# Patient Record
Sex: Female | Born: 1950
Health system: Southern US, Community
[De-identification: ages and names within clinical notes are randomized; demographics above are authoritative.]

## PROBLEM LIST (undated history)

## (undated) DIAGNOSIS — E119 Type 2 diabetes mellitus without complications: Secondary | ICD-10-CM

## (undated) DIAGNOSIS — S52123A Displaced fracture of head of unspecified radius, initial encounter for closed fracture: Secondary | ICD-10-CM

## (undated) DIAGNOSIS — Z9889 Other specified postprocedural states: Secondary | ICD-10-CM

## (undated) DIAGNOSIS — Z9221 Personal history of antineoplastic chemotherapy: Secondary | ICD-10-CM

## (undated) DIAGNOSIS — M1990 Unspecified osteoarthritis, unspecified site: Secondary | ICD-10-CM

## (undated) DIAGNOSIS — E785 Hyperlipidemia, unspecified: Secondary | ICD-10-CM

## (undated) DIAGNOSIS — I10 Essential (primary) hypertension: Secondary | ICD-10-CM

## (undated) DIAGNOSIS — S42409A Unspecified fracture of lower end of unspecified humerus, initial encounter for closed fracture: Secondary | ICD-10-CM

## (undated) DIAGNOSIS — C801 Malignant (primary) neoplasm, unspecified: Secondary | ICD-10-CM

## (undated) DIAGNOSIS — T7840XA Allergy, unspecified, initial encounter: Secondary | ICD-10-CM

## (undated) DIAGNOSIS — B019 Varicella without complication: Secondary | ICD-10-CM

## (undated) DIAGNOSIS — Z923 Personal history of irradiation: Secondary | ICD-10-CM

## (undated) HISTORY — PX: COLONOSCOPY: SHX174

## (undated) HISTORY — DX: Hyperlipidemia, unspecified: E78.5

## (undated) HISTORY — DX: Allergy, unspecified, initial encounter: T78.40XA

## (undated) HISTORY — DX: Varicella without complication: B01.9

## (undated) HISTORY — DX: Displaced fracture of head of unspecified radius, initial encounter for closed fracture: S52.123A

## (undated) HISTORY — DX: Unspecified fracture of lower end of unspecified humerus, initial encounter for closed fracture: S42.409A

## (undated) HISTORY — DX: Malignant (primary) neoplasm, unspecified: C80.1

## (undated) HISTORY — DX: Type 2 diabetes mellitus without complications: E11.9

## (undated) HISTORY — DX: Essential (primary) hypertension: I10

## (undated) HISTORY — PX: FRACTURE SURGERY: SHX138

## (undated) HISTORY — DX: Other specified postprocedural states: Z98.890

## (undated) HISTORY — DX: Unspecified osteoarthritis, unspecified site: M19.90

---

## 1988-07-31 HISTORY — PX: DILATION AND CURETTAGE OF UTERUS: SHX78

## 1996-07-31 HISTORY — PX: WRIST SURGERY: SHX841

## 1999-08-01 DIAGNOSIS — I10 Essential (primary) hypertension: Secondary | ICD-10-CM

## 1999-08-01 HISTORY — DX: Essential (primary) hypertension: I10

## 2006-11-27 ENCOUNTER — Ambulatory Visit: Payer: Self-pay

## 2007-12-18 ENCOUNTER — Ambulatory Visit: Payer: Self-pay

## 2009-07-31 DIAGNOSIS — Z9221 Personal history of antineoplastic chemotherapy: Secondary | ICD-10-CM

## 2009-07-31 DIAGNOSIS — Z9889 Other specified postprocedural states: Secondary | ICD-10-CM

## 2009-07-31 DIAGNOSIS — Z923 Personal history of irradiation: Secondary | ICD-10-CM

## 2009-07-31 DIAGNOSIS — C801 Malignant (primary) neoplasm, unspecified: Secondary | ICD-10-CM

## 2009-07-31 HISTORY — PX: OTHER SURGICAL HISTORY: SHX169

## 2009-07-31 HISTORY — DX: Personal history of antineoplastic chemotherapy: Z92.21

## 2009-07-31 HISTORY — PX: BREAST SURGERY: SHX581

## 2009-07-31 HISTORY — DX: Other specified postprocedural states: Z98.890

## 2009-07-31 HISTORY — PX: BREAST LUMPECTOMY: SHX2

## 2009-07-31 HISTORY — DX: Personal history of irradiation: Z92.3

## 2009-07-31 HISTORY — PX: BREAST BIOPSY: SHX20

## 2009-07-31 HISTORY — DX: Malignant (primary) neoplasm, unspecified: C80.1

## 2009-12-29 ENCOUNTER — Ambulatory Visit: Payer: Self-pay | Admitting: Family Medicine

## 2010-01-04 ENCOUNTER — Ambulatory Visit: Payer: Self-pay | Admitting: Family Medicine

## 2010-01-11 ENCOUNTER — Ambulatory Visit: Payer: Self-pay

## 2010-02-28 ENCOUNTER — Ambulatory Visit: Payer: Self-pay | Admitting: Radiation Oncology

## 2010-03-04 ENCOUNTER — Ambulatory Visit: Payer: Self-pay | Admitting: General Surgery

## 2010-03-06 LAB — CANCER ANTIGEN 27.29: CA 27.29: 10.8 U/mL (ref 0.0–38.6)

## 2010-03-14 ENCOUNTER — Ambulatory Visit: Payer: Self-pay | Admitting: General Surgery

## 2010-03-16 LAB — PATHOLOGY REPORT

## 2010-03-31 ENCOUNTER — Ambulatory Visit: Payer: Self-pay | Admitting: Radiation Oncology

## 2010-04-07 ENCOUNTER — Ambulatory Visit: Payer: Self-pay | Admitting: General Surgery

## 2010-04-11 ENCOUNTER — Ambulatory Visit: Payer: Self-pay | Admitting: General Surgery

## 2010-04-12 ENCOUNTER — Ambulatory Visit: Payer: Self-pay | Admitting: Radiation Oncology

## 2010-04-30 ENCOUNTER — Ambulatory Visit: Payer: Self-pay | Admitting: Radiation Oncology

## 2010-05-11 ENCOUNTER — Ambulatory Visit: Payer: Self-pay | Admitting: General Surgery

## 2010-05-31 ENCOUNTER — Ambulatory Visit: Payer: Self-pay | Admitting: Radiation Oncology

## 2010-06-17 ENCOUNTER — Inpatient Hospital Stay: Payer: Self-pay | Admitting: Internal Medicine

## 2010-06-30 ENCOUNTER — Ambulatory Visit: Payer: Self-pay | Admitting: Radiation Oncology

## 2010-07-31 ENCOUNTER — Ambulatory Visit: Payer: Self-pay | Admitting: Radiation Oncology

## 2010-07-31 HISTORY — PX: PORT-A-CATH REMOVAL: SHX5289

## 2010-08-31 ENCOUNTER — Ambulatory Visit: Payer: Self-pay | Admitting: Radiation Oncology

## 2010-09-29 ENCOUNTER — Ambulatory Visit: Payer: Self-pay | Admitting: Internal Medicine

## 2010-09-29 ENCOUNTER — Ambulatory Visit: Payer: Self-pay | Admitting: Radiation Oncology

## 2010-10-05 ENCOUNTER — Ambulatory Visit: Payer: Self-pay | Admitting: General Surgery

## 2010-10-30 ENCOUNTER — Ambulatory Visit: Payer: Self-pay | Admitting: Radiation Oncology

## 2010-12-09 ENCOUNTER — Ambulatory Visit: Payer: Self-pay | Admitting: Internal Medicine

## 2010-12-30 ENCOUNTER — Ambulatory Visit: Payer: Self-pay | Admitting: Internal Medicine

## 2011-04-11 ENCOUNTER — Ambulatory Visit: Payer: Self-pay | Admitting: General Surgery

## 2011-04-14 ENCOUNTER — Ambulatory Visit: Payer: Self-pay | Admitting: Internal Medicine

## 2011-05-01 ENCOUNTER — Ambulatory Visit: Payer: Self-pay | Admitting: Internal Medicine

## 2011-05-02 ENCOUNTER — Ambulatory Visit: Payer: Self-pay | Admitting: General Surgery

## 2011-05-03 ENCOUNTER — Ambulatory Visit: Payer: Self-pay | Admitting: Internal Medicine

## 2011-06-12 ENCOUNTER — Ambulatory Visit: Payer: Self-pay | Admitting: Internal Medicine

## 2011-09-18 ENCOUNTER — Ambulatory Visit: Payer: Self-pay | Admitting: Internal Medicine

## 2011-09-18 LAB — CBC CANCER CENTER
Basophil %: 0.4 %
Eosinophil %: 2.1 %
HCT: 34.2 % — ABNORMAL LOW (ref 35.0–47.0)
HGB: 11.9 g/dL — ABNORMAL LOW (ref 12.0–16.0)
Lymphocyte %: 26.1 %
MCH: 31.7 pg (ref 26.0–34.0)
Monocyte #: 0.5 x10 3/mm (ref 0.0–0.7)
Monocyte %: 8.5 %
Neutrophil #: 3.5 x10 3/mm (ref 1.4–6.5)
Neutrophil %: 62.9 %
Platelet: 343 x10 3/mm (ref 150–440)
RBC: 3.74 10*6/uL — ABNORMAL LOW (ref 3.80–5.20)
WBC: 5.5 x10 3/mm (ref 3.6–11.0)

## 2011-09-18 LAB — HEPATIC FUNCTION PANEL A (ARMC)
Albumin: 3.9 g/dL (ref 3.4–5.0)
Alkaline Phosphatase: 61 U/L (ref 50–136)
Bilirubin, Direct: <0.1 mg/dL (ref 0.00–0.20)
SGOT(AST): 16 U/L (ref 15–37)

## 2011-09-18 LAB — CREATININE, SERUM
EGFR (African American): >60
EGFR (Non-African Amer.): 51 — ABNORMAL LOW

## 2011-09-29 ENCOUNTER — Ambulatory Visit: Payer: Self-pay | Admitting: Internal Medicine

## 2011-10-09 ENCOUNTER — Ambulatory Visit: Payer: Self-pay | Admitting: General Surgery

## 2012-04-16 ENCOUNTER — Ambulatory Visit: Payer: Self-pay

## 2012-08-29 ENCOUNTER — Encounter: Payer: Self-pay | Admitting: *Deleted

## 2012-08-29 DIAGNOSIS — I10 Essential (primary) hypertension: Secondary | ICD-10-CM | POA: Insufficient documentation

## 2012-08-29 DIAGNOSIS — C801 Malignant (primary) neoplasm, unspecified: Secondary | ICD-10-CM | POA: Insufficient documentation

## 2012-08-30 ENCOUNTER — Encounter: Payer: Self-pay | Admitting: *Deleted

## 2012-11-28 ENCOUNTER — Ambulatory Visit: Payer: Self-pay | Admitting: Internal Medicine

## 2012-12-30 ENCOUNTER — Ambulatory Visit: Payer: Self-pay | Admitting: Internal Medicine

## 2012-12-30 LAB — CBC CANCER CENTER
Basophil #: 0.1 x10 3/mm (ref 0.0–0.1)
Eosinophil %: 1.7 %
HCT: 37.6 % (ref 35.0–47.0)
Lymphocyte #: 2 x10 3/mm (ref 1.0–3.6)
MCH: 30.6 pg (ref 26.0–34.0)
MCV: 90 fL (ref 80–100)
Monocyte #: 0.5 x10 3/mm (ref 0.2–0.9)
Neutrophil %: 62.2 %
Platelet: 367 x10 3/mm (ref 150–440)
RDW: 12.8 % (ref 11.5–14.5)
WBC: 6.9 x10 3/mm (ref 3.6–11.0)

## 2012-12-30 LAB — CREATININE, SERUM
Creatinine: 0.96 mg/dL (ref 0.60–1.30)
EGFR (African American): >60
EGFR (Non-African Amer.): >60

## 2012-12-30 LAB — HEPATIC FUNCTION PANEL A (ARMC)
Albumin: 3.8 g/dL (ref 3.4–5.0)
Alkaline Phosphatase: 69 U/L (ref 50–136)
SGOT(AST): 11 U/L — ABNORMAL LOW (ref 15–37)
Total Protein: 7.2 g/dL (ref 6.4–8.2)

## 2013-01-28 ENCOUNTER — Ambulatory Visit: Payer: Self-pay | Admitting: Internal Medicine

## 2013-02-05 ENCOUNTER — Encounter: Payer: Self-pay | Admitting: Family Medicine

## 2013-02-28 ENCOUNTER — Encounter: Payer: Self-pay | Admitting: Family Medicine

## 2013-04-17 ENCOUNTER — Ambulatory Visit: Payer: Self-pay | Admitting: General Surgery

## 2013-04-21 ENCOUNTER — Encounter: Payer: Self-pay | Admitting: General Surgery

## 2013-04-28 ENCOUNTER — Ambulatory Visit: Payer: Self-pay | Admitting: General Surgery

## 2013-04-29 ENCOUNTER — Ambulatory Visit (INDEPENDENT_AMBULATORY_CARE_PROVIDER_SITE_OTHER): Payer: BC Managed Care – PPO | Admitting: General Surgery

## 2013-04-29 ENCOUNTER — Encounter: Payer: Self-pay | Admitting: General Surgery

## 2013-04-29 ENCOUNTER — Other Ambulatory Visit: Payer: BC Managed Care – PPO

## 2013-04-29 VITALS — BP 124/68 | HR 74 | Resp 12 | Ht 60.0 in | Wt 152.0 lb

## 2013-04-29 DIAGNOSIS — Z853 Personal history of malignant neoplasm of breast: Secondary | ICD-10-CM

## 2013-04-29 DIAGNOSIS — N63 Unspecified lump in unspecified breast: Secondary | ICD-10-CM

## 2013-04-29 DIAGNOSIS — R920 Mammographic microcalcification found on diagnostic imaging of breast: Secondary | ICD-10-CM

## 2013-04-29 NOTE — Patient Instructions (Addendum)
The patient has been asked to return to the office in six months for a right diagnostic mammogram.

## 2013-04-29 NOTE — Progress Notes (Signed)
Patient ID: Hannah [name], female   DOB: 07/02/1951, 62 y.o.   MRN: [id number]  Chief Complaint  Patient presents with  . Follow-up    mammogram    HPI Hannah [name] is a 62 y.o. female who presents for a breast evaluation. The most recent mammogram was done on 04/17/13. Patient does perform regular self breast checks and gets regular mammograms done.   The patient underwent excision of a 9 mm invasive mammary carcinoma in August 2011 followed by MammoSite partial breast radiation. She received adjuvant chemotherapy under the care of Teofilo Pod, M.D. She reports no difficulties with her breasts.  HPI  Past Medical History  Diagnosis Date  . Hypertension 2001  . Cancer 2011     DCIS with a small foci of invasive cancer was identified  . History of lumpectomy 2011    Rifgr breast  . Diabetes mellitus without complication     Past Surgical History  Procedure Laterality Date  . Port-a-cath removal  2012  . Right breast wide excision  2011  . Wrist surgery  1998    pins placed  . Dilation and curettage of uterus  1990  . Breast surgery Right 2011     right breast wide excision     No family history on file.  Social History History  Substance Use Topics  . Smoking status: Never Smoker   . Smokeless tobacco: Never Used  . Alcohol Use: No    Allergies  Allergen Reactions  . Codeine Other (See Comments)    Mental changes  . Penicillins Itching and Rash    Current Outpatient Prescriptions  Medication Sig Dispense Refill  . anastrozole (ARIMIDEX) 1 MG tablet Take 1 mg by mouth daily.      . B Complex Vitamins (B-COMPLEX/B-12 PO) Take 1 capsule by mouth daily.      . carvedilol (COREG) 25 MG tablet Take 25 mg by mouth 2 (two) times daily with a meal.      . Cholecalciferol (VITAMIN D3) 1000 UNITS CAPS Take 1 capsule by mouth daily.      [name] FLUoxetine (PROZAC) 20 MG capsule Take 20 mg by mouth daily.      . hydrochlorothiazide (HYDRODIURIL) 50 MG tablet 50 mg.       . metFORMIN (GLUCOPHAGE) 500 MG tablet Take 500 mg by mouth once.      . Naproxen Sodium (ALEVE PO) Take by mouth as needed.      . simvastatin (ZOCOR) 40 MG tablet Take 40 mg by mouth at bedtime.       No current facility-administered medications for this visit.    Review of Systems Review of Systems  Constitutional: Negative.   Respiratory: Negative.   Cardiovascular: Negative.     Blood pressure 124/68, pulse 74, resp. rate 12, height 5' (1.524 m), weight 152 lb (68.947 kg).  Physical Exam Physical Exam  Constitutional: She is oriented to person, place, and time. She appears well-developed and well-nourished.  Eyes: No scleral icterus.  Cardiovascular: Normal rate, regular rhythm and normal heart sounds.   Pulmonary/Chest: Breath sounds normal. Right breast exhibits no inverted nipple, no mass, no nipple discharge, no skin change and no tenderness. Left breast exhibits no inverted nipple, no mass, no nipple discharge, no skin change and no tenderness.    Right breast  2 cm area of telangiectasia between the superior lateral aspect of the primary incision and the axillary node site. Thickening along the right breast scar.  Abdominal: Bowel  sounds are normal.  Lymphadenopathy:    She has no cervical adenopathy.    She has no axillary adenopathy.  Neurological: She is alert and oriented to person, place, and time.  Skin: Skin is dry.    Data Reviewed  mammogram dated 04/17/2013 was reviewed. Cluster of heterogeneous calcifications covering 6 mm identified adjacent to a nodular density in the breast new from 2013. Left breast is unremarkable. Impression developing microcalcifications of lumpectomy site as well as nodular densities. Ultrasound and/or biopsy was recommended. At a minimum, six-month followup mammogram recommended.  Ultrasound examination of the left breast was completed from the area of the original wide excision to the axilla. Multiple hypoechoic nodules are noted  within the field. At the 10:00 position 1 cm from the nipple a 0.36 x 0.42 x 0.4 for simple cyst is identified. Along the incision, 5 cm amenable to 10:00 position a complex softly lobulated mass with some posterior acoustic enhancement measuring 0.46 x 0.67 x 0.78 cm is noted. Superficially along the incision at the 7:00 position a 0.56 x 0.87 x 0.88 softly lobulated hypoechoic nodule or areas appreciated. All these areas and thought to represent changes secondary to surgery and fat necrosis.  Assessment    New nodularity in area of previous high-dose radiation. Benign clinical exam. Telangiectasias of the breast.    Plan    The radiology report was reviewed with the patient. At this time comfortable the six-month followup. The patient is comfortable as well.       [name] 04/30/2013, 7:01 PM

## 2013-04-30 ENCOUNTER — Encounter: Payer: Self-pay | Admitting: General Surgery

## 2013-04-30 DIAGNOSIS — R920 Mammographic microcalcification found on diagnostic imaging of breast: Secondary | ICD-10-CM | POA: Insufficient documentation

## 2013-08-12 ENCOUNTER — Encounter: Payer: Self-pay | Admitting: General Surgery

## 2013-08-12 ENCOUNTER — Ambulatory Visit: Payer: Self-pay | Admitting: Internal Medicine

## 2013-08-12 LAB — CBC CANCER CENTER
BASOS PCT: 0.8 %
Basophil #: 0.1 x10 3/mm (ref 0.0–0.1)
Eosinophil #: 0.2 x10 3/mm (ref 0.0–0.7)
Eosinophil %: 2.6 %
HCT: 37.6 % (ref 35.0–47.0)
HGB: 12.3 g/dL (ref 12.0–16.0)
Lymphocyte #: 1.9 x10 3/mm (ref 1.0–3.6)
Lymphocyte %: 25.2 %
MCH: 29.8 pg (ref 26.0–34.0)
MCHC: 32.8 g/dL (ref 32.0–36.0)
MCV: 91 fL (ref 80–100)
Monocyte #: 0.4 x10 3/mm (ref 0.2–0.9)
Monocyte %: 5 %
Neutrophil #: 4.9 x10 3/mm (ref 1.4–6.5)
Neutrophil %: 66.4 %
Platelet: 403 x10 3/mm (ref 150–440)
RBC: 4.13 10*6/uL (ref 3.80–5.20)
RDW: 12.7 % (ref 11.5–14.5)
WBC: 7.4 x10 3/mm (ref 3.6–11.0)

## 2013-08-12 LAB — HEPATIC FUNCTION PANEL A (ARMC)
Albumin: 3.7 g/dL (ref 3.4–5.0)
Alkaline Phosphatase: 68 U/L
Bilirubin, Direct: 0.1 mg/dL (ref 0.00–0.20)
Bilirubin,Total: 0.3 mg/dL (ref 0.2–1.0)
SGOT(AST): 17 U/L (ref 15–37)
SGPT (ALT): 29 U/L (ref 12–78)
Total Protein: 6.7 g/dL (ref 6.4–8.2)

## 2013-08-12 LAB — CREATININE, SERUM
CREATININE: 0.95 mg/dL (ref 0.60–1.30)
EGFR (Non-African Amer.): >60

## 2013-08-31 ENCOUNTER — Ambulatory Visit: Payer: Self-pay | Admitting: Internal Medicine

## 2013-10-16 ENCOUNTER — Ambulatory Visit: Payer: Self-pay | Admitting: General Surgery

## 2013-10-19 ENCOUNTER — Encounter: Payer: Self-pay | Admitting: General Surgery

## 2013-10-28 ENCOUNTER — Ambulatory Visit (INDEPENDENT_AMBULATORY_CARE_PROVIDER_SITE_OTHER): Payer: No Typology Code available for payment source | Admitting: General Surgery

## 2013-10-28 ENCOUNTER — Encounter: Payer: Self-pay | Admitting: General Surgery

## 2013-10-28 VITALS — BP 102/70 | HR 88 | Resp 14 | Ht 60.0 in | Wt 151.0 lb

## 2013-10-28 DIAGNOSIS — C50919 Malignant neoplasm of unspecified site of unspecified female breast: Secondary | ICD-10-CM

## 2013-10-28 NOTE — Progress Notes (Signed)
Patient ID: Hannah [name], female   DOB: 12-05-1950, 63 y.o.   MRN: [id number]  Chief Complaint  Patient presents with  . Follow-up    mammogram    HPI Hannah [name] is a 63 y.o. female.  who presents for her follow up mammogram and breast evaluation. The most recent right mammogram was done on 10-16-13.  Patient does perform regular self breast checks and gets regular mammograms done.  No new breast issues. Tolerating the Arimidex without ill effect.  HPI  Past Medical History  Diagnosis Date  . Hypertension 2001  . History of lumpectomy 2011    Rifgr breast  . Diabetes mellitus without complication   . Cancer 2011     DCIS. 8 mm this larger grade 2 invasive mammary carcinoma, ER 90%, PR 5%, HER-2/neu not over expressed. Sentinel nodes negative.    Past Surgical History  Procedure Laterality Date  . Port-a-cath removal  2012  . Right breast wide excision  2011  . Wrist surgery  1998    pins placed  . Dilation and curettage of uterus  1990  . Breast surgery Right 2011     right breast wide excision     No family history on file.  Social History History  Substance Use Topics  . Smoking status: Never Smoker   . Smokeless tobacco: Never Used  . Alcohol Use: No    Allergies  Allergen Reactions  . Codeine Other (See Comments)    Mental changes  . Penicillins Itching and Rash    Current Outpatient Prescriptions  Medication Sig Dispense Refill  . anastrozole (ARIMIDEX) 1 MG tablet Take 1 mg by mouth daily.      . B Complex Vitamins (B-COMPLEX/B-12 PO) Take 1 capsule by mouth daily.      . carvedilol (COREG) 25 MG tablet Take 25 mg by mouth 2 (two) times daily with a meal.      . Cholecalciferol (VITAMIN D3) 1000 UNITS CAPS Take 1 capsule by mouth daily.      [name] FLUoxetine (PROZAC) 20 MG capsule Take 20 mg by mouth daily.      . hydrochlorothiazide (HYDRODIURIL) 50 MG tablet 50 mg.      . metFORMIN (GLUCOPHAGE) 500 MG tablet Take 1,000 mg by mouth 2 (two) times  daily with a meal.       . Naproxen Sodium (ALEVE PO) Take by mouth as needed.      . simvastatin (ZOCOR) 40 MG tablet Take 40 mg by mouth at bedtime.       No current facility-administered medications for this visit.    Review of Systems Review of Systems  Constitutional: Negative.   Respiratory: Negative.   Cardiovascular: Negative.     Blood pressure 102/70, pulse 88, resp. rate 14, height 5' (1.524 m), weight 151 lb (68.493 kg), last menstrual period 08/30/2003.  Physical Exam Physical Exam  Constitutional: She is oriented to person, place, and time. She appears well-developed and well-nourished.  Neck: Neck supple.  Cardiovascular: Normal rate, regular rhythm and normal heart sounds.   Pulmonary/Chest: Effort normal and breath sounds normal. Left breast exhibits no inverted nipple, no mass, no nipple discharge, no skin change and no tenderness. Breasts are asymmetrical (minimal volume loss on the right. Slight distraction of the nipple laterally.).    Right breast is unchanged from last December with skin changes that are unchanged.  Lymphadenopathy:    She has no cervical adenopathy.    She has no axillary adenopathy.  Neurological: She is alert and oriented to person, place, and time.  Skin: Skin is warm and dry.    Data Reviewed Right breast mammogram dated October 16, 2013 dystrophic calcifications identified in 2 areas without progression from her last exam. BI-RAD-3. Areas are most consistent with fat necrosis.  Assessment    Stable clinical exam, T1b, N0 carcinoma the right breast.     Plan    We'll plan for a followup exam in 6 months with bilateral mammograms.    Follow up with bilateral diagnostic mammogram at [facility] with office visit.  PCP: Placido Sou   Dr Diona Fanti 10/28/2013, 9:19 PM

## 2013-10-28 NOTE — Patient Instructions (Addendum)
Continue self breast exams. Call office for any new breast issues or concerns. Follow up with bilateral diagnostic mammogram at [facility] with office visit.

## 2014-02-10 ENCOUNTER — Ambulatory Visit: Payer: Self-pay | Admitting: Internal Medicine

## 2014-02-10 LAB — CBC CANCER CENTER
BASOS ABS: 0.1 x10 3/mm (ref 0.0–0.1)
BASOS PCT: 0.9 %
Eosinophil #: 0.2 x10 3/mm (ref 0.0–0.7)
Eosinophil %: 2.2 %
HCT: 37.6 % (ref 35.0–47.0)
HGB: 12.5 g/dL (ref 12.0–16.0)
LYMPHS PCT: 31 %
Lymphocyte #: 2.5 x10 3/mm (ref 1.0–3.6)
MCH: 30.3 pg (ref 26.0–34.0)
MCHC: 33.2 g/dL (ref 32.0–36.0)
MCV: 91 fL (ref 80–100)
Monocyte #: 0.7 x10 3/mm (ref 0.2–0.9)
Monocyte %: 8.2 %
Neutrophil #: 4.6 x10 3/mm (ref 1.4–6.5)
Neutrophil %: 57.7 %
PLATELETS: 399 x10 3/mm (ref 150–440)
RBC: 4.12 10*6/uL (ref 3.80–5.20)
RDW: 13.2 % (ref 11.5–14.5)
WBC: 8 x10 3/mm (ref 3.6–11.0)

## 2014-02-10 LAB — HEPATIC FUNCTION PANEL A (ARMC)
ALK PHOS: 69 U/L
Albumin: 3.8 g/dL (ref 3.4–5.0)
Bilirubin,Total: 0.5 mg/dL (ref 0.2–1.0)
SGOT(AST): 17 U/L (ref 15–37)
SGPT (ALT): 34 U/L (ref 12–78)
TOTAL PROTEIN: 7.4 g/dL (ref 6.4–8.2)

## 2014-02-10 LAB — CALCIUM: Calcium, Total: 9.9 mg/dL (ref 8.5–10.1)

## 2014-02-28 ENCOUNTER — Ambulatory Visit: Payer: Self-pay | Admitting: Internal Medicine

## 2014-04-16 ENCOUNTER — Encounter: Payer: Self-pay | Admitting: General Surgery

## 2014-04-27 ENCOUNTER — Encounter: Payer: Self-pay | Admitting: General Surgery

## 2014-04-27 ENCOUNTER — Ambulatory Visit (INDEPENDENT_AMBULATORY_CARE_PROVIDER_SITE_OTHER): Payer: No Typology Code available for payment source | Admitting: General Surgery

## 2014-04-27 VITALS — BP 112/80 | HR 80 | Resp 12 | Ht 60.0 in | Wt 153.0 lb

## 2014-04-27 DIAGNOSIS — C50911 Malignant neoplasm of unspecified site of right female breast: Secondary | ICD-10-CM

## 2014-04-27 DIAGNOSIS — C50919 Malignant neoplasm of unspecified site of unspecified female breast: Secondary | ICD-10-CM

## 2014-04-27 NOTE — Patient Instructions (Signed)
Patient to return in 6 months for follow up after mammogram. Continue self breast exams. Call office for any new breast issues or concerns.

## 2014-04-27 NOTE — Progress Notes (Signed)
Patient ID: Hannah [name], female   DOB: October 18, 1950, 63 y.o.   MRN: [id number]  Chief Complaint  Patient presents with  . Follow-up    6 month follow up mammogram     HPI Hannah [name] is a 63 y.o. female who presents for a breast evaluation. The most recent mammogram was done on 04/16/14. Patient does perform regular self breast checks and gets regular mammograms done.  The patient denies any new problems with the breasts at this time. The patient had a bone density done approximately March 2015.   HPI  Past Medical History  Diagnosis Date  . Hypertension 2001  . History of lumpectomy 2011    Rifgr breast  . Diabetes mellitus without complication   . Cancer 2011     DCIS. 8 mm this larger grade 2 invasive mammary carcinoma, ER 90%, PR 5%, HER-2/neu not over expressed. Sentinel nodes negative.    Past Surgical History  Procedure Laterality Date  . Port-a-cath removal  2012  . Right breast wide excision  2011  . Wrist surgery  1998    pins placed  . Dilation and curettage of uterus  1990  . Breast surgery Right 2011     right breast wide excision     History reviewed. No pertinent family history.  Social History History  Substance Use Topics  . Smoking status: Never Smoker   . Smokeless tobacco: Never Used  . Alcohol Use: No    Allergies  Allergen Reactions  . Codeine Other (See Comments)    Mental changes  . Penicillins Itching and Rash    Current Outpatient Prescriptions  Medication Sig Dispense Refill  . anastrozole (ARIMIDEX) 1 MG tablet Take 1 mg by mouth daily.      . B Complex Vitamins (B-COMPLEX/B-12 PO) Take 1 capsule by mouth daily.      . carvedilol (COREG) 25 MG tablet Take 25 mg by mouth 2 (two) times daily with a meal.      . Cholecalciferol (VITAMIN D3) 1000 UNITS CAPS Take 1 capsule by mouth daily.      [name] FLUoxetine (PROZAC) 20 MG capsule Take 20 mg by mouth daily.      . fluticasone (FLONASE) 50 MCG/ACT nasal spray Place 1 spray into both  nostrils 2 (two) times daily.      [name] GLIPIZIDE XL 2.5 MG 24 hr tablet Take 1 tablet by mouth daily.      . hydrochlorothiazide (HYDRODIURIL) 50 MG tablet 50 mg.      . metFORMIN (GLUCOPHAGE) 500 MG tablet Take 1,000 mg by mouth 2 (two) times daily with a meal.       . Naproxen Sodium (ALEVE PO) Take by mouth as needed.      . simvastatin (ZOCOR) 40 MG tablet Take 40 mg by mouth at bedtime.       No current facility-administered medications for this visit.    Review of Systems Review of Systems  Constitutional: Negative.   Respiratory: Negative.   Cardiovascular: Negative.     Blood pressure 112/80, pulse 80, resp. rate 12, height 5' (1.524 m), weight 153 lb (69.4 kg), last menstrual period 08/30/2003.  Physical Exam Physical Exam  Constitutional: She is oriented to person, place, and time. She appears well-developed and well-nourished.  Neck: Neck supple. No thyromegaly present.  Cardiovascular: Normal rate, regular rhythm and normal heart sounds.   No murmur heard. Pulmonary/Chest: Effort normal and breath sounds normal. Right breast exhibits no inverted nipple, no  mass, no nipple discharge, no skin change and no tenderness. Left breast exhibits no inverted nipple, no mass, no nipple discharge, no skin change and no tenderness.  Lateral aspect of the right breast 2 cm area of telanectasia.   Thickening in the upper outer quadrant of the right breast.   Lymphadenopathy:    She has no cervical adenopathy.    She has no axillary adenopathy.  Neurological: She is alert and oriented to person, place, and time.  Skin: Skin is warm and dry.    Data Reviewed The lateral mammograms didn't April 16, 2014 were reviewed and compared to previous studies. BI-RAD-3. Architectural distortion the previous biopsy site with no interval change.  Assessment    Benign breast exam. Candidate for screening colonoscopy.     Plan    We'll plan for a followup exam in 6 months with a repeat  mammogram as recommended by the radiologist.  The patient will consider whether she wants to proceed with colonoscopy.     PCP: Placido Sou Ref. MD: Dr Charletta Cousin, [place] Gleason 04/28/2014, 9:06 PM

## 2014-04-28 ENCOUNTER — Encounter: Payer: Self-pay | Admitting: General Surgery

## 2014-06-01 ENCOUNTER — Encounter: Payer: Self-pay | Admitting: General Surgery

## 2014-10-12 ENCOUNTER — Encounter: Payer: Self-pay | Admitting: General Surgery

## 2014-10-20 ENCOUNTER — Encounter: Payer: Self-pay | Admitting: General Surgery

## 2014-10-20 ENCOUNTER — Ambulatory Visit (INDEPENDENT_AMBULATORY_CARE_PROVIDER_SITE_OTHER): Payer: BLUE CROSS/BLUE SHIELD | Admitting: General Surgery

## 2014-10-20 VITALS — BP 140/74 | HR 72 | Resp 12 | Ht 60.0 in | Wt 156.0 lb

## 2014-10-20 DIAGNOSIS — C50911 Malignant neoplasm of unspecified site of right female breast: Secondary | ICD-10-CM

## 2014-10-20 NOTE — Progress Notes (Signed)
Patient ID: Hannah [name], female   DOB: 1951/07/20, 64 y.o.   MRN: [id number]  Chief Complaint  Patient presents with  . Follow-up    mammogram    HPI Hannah [name] is a 64 y.o. female who presents for a breast evaluation. The most recent right mammogram was done on 10/09/14.  Patient does perform regular self breast checks and gets regular mammograms done.    HPI  Past Medical History  Diagnosis Date  . Hypertension 2001  . History of lumpectomy 2011    Right breast  . Diabetes mellitus without complication   . Cancer 2011     DCIS. 9 mm histologic grade 2 invasive mammary carcinoma, ER 90%, PR 5%, HER-2/neu not over expressed. Sentinel nodes negative.    Past Surgical History  Procedure Laterality Date  . Port-a-cath removal  2012  . Right breast wide excision  2011  . Wrist surgery  1998    pins placed  . Dilation and curettage of uterus  1990  . Colonoscopy  Declines    Reports she has annual stool fecal occult blood testing.  . Breast surgery Right 2011     right breast wide excision, partial breast radiation.    No family history on file.  Social History History  Substance Use Topics  . Smoking status: Never Smoker   . Smokeless tobacco: Never Used  . Alcohol Use: No    Allergies  Allergen Reactions  . Codeine Other (See Comments)    Mental changes  . Penicillins Itching and Rash    Current Outpatient Prescriptions  Medication Sig Dispense Refill  . anastrozole (ARIMIDEX) 1 MG tablet Take 1 mg by mouth daily.    . B Complex Vitamins (B-COMPLEX/B-12 PO) Take 1 capsule by mouth daily.    . carvedilol (COREG) 25 MG tablet Take 25 mg by mouth 2 (two) times daily with a meal.    . Cholecalciferol (VITAMIN D3) 1000 UNITS CAPS Take 1 capsule by mouth daily.    . fexofenadine (ALLEGRA) 180 MG tablet Take 180 mg by mouth daily.    [name] FLUoxetine (PROZAC) 20 MG capsule Take 20 mg by mouth daily.    . fluticasone (FLONASE) 50 MCG/ACT nasal spray Place 1 spray  into both nostrils 2 (two) times daily.    [name] GLIPIZIDE XL 2.5 MG 24 hr tablet Take 1 tablet by mouth daily.    [name] losartan-hydrochlorothiazide (HYZAAR) 50-12.5 MG per tablet Take 1 tablet by mouth daily.    . metFORMIN (GLUCOPHAGE) 500 MG tablet Take 1,000 mg by mouth 2 (two) times daily with a meal.     . Naproxen Sodium (ALEVE PO) Take by mouth as needed.    . Omega-3 Fatty Acids (FISH OIL) 1000 MG CAPS Take 2 capsules by mouth daily.    . simvastatin (ZOCOR) 40 MG tablet Take 40 mg by mouth at bedtime.     No current facility-administered medications for this visit.    Review of Systems Review of Systems  Constitutional: Negative.   Respiratory: Negative.   Cardiovascular: Negative.     Blood pressure 140/74, pulse 72, resp. rate 12, height 5' (1.524 m), weight 156 lb (70.761 kg), last menstrual period 08/30/2003.  Physical Exam Physical Exam  Constitutional: She is oriented to person, place, and time. She appears well-developed and well-nourished.  Eyes: Conjunctivae are normal. No scleral icterus.  Neck: Neck supple.  Cardiovascular: Normal rate, regular rhythm and normal heart sounds.   Pulmonary/Chest: Effort normal and  breath sounds normal. Right breast exhibits no inverted nipple, no mass, no nipple discharge, no skin change and no tenderness. Left breast exhibits no inverted nipple, no mass, no nipple discharge, no skin change and no tenderness.    Right breast volume lost in the upper outer quadrant. Lateral aspect of the right breast 2 cm area of telanectasia. Light thickening at the lateral scar.   Lymphadenopathy:    She has no cervical adenopathy.  Neurological: She is alert and oriented to person, place, and time.  Skin: Skin is warm and dry.    Data Reviewed Bilateral mammograms dated 10/09/2014 showed no interval change. BI-RADS-2.  Assessment    Benign breast exam.    Plan     The patient was encouraged to consider a screening colonoscopy. This was  declined.  She'll likely completed aromatase inhibitor within the next year. This will be handled by Dr. Ma Hillock.  Patient will be asked to return to the office in one year with a bilateral screening mammogram.     PCP:  Tiburcio Bash 10/20/2014, 9:00 PM

## 2014-10-20 NOTE — Patient Instructions (Signed)
Patient will be asked to return to the office in one year with a bilateral screening mammogram. 

## 2015-02-12 ENCOUNTER — Inpatient Hospital Stay: Payer: BLUE CROSS/BLUE SHIELD | Attending: Internal Medicine

## 2015-02-12 ENCOUNTER — Other Ambulatory Visit: Payer: Self-pay | Admitting: *Deleted

## 2015-02-12 ENCOUNTER — Inpatient Hospital Stay (HOSPITAL_BASED_OUTPATIENT_CLINIC_OR_DEPARTMENT_OTHER): Payer: BLUE CROSS/BLUE SHIELD | Admitting: Internal Medicine

## 2015-02-12 ENCOUNTER — Encounter: Payer: Self-pay | Admitting: Internal Medicine

## 2015-02-12 VITALS — BP 127/94 | HR 76 | Temp 98.6°F | Resp 18 | Ht 60.0 in | Wt 151.7 lb

## 2015-02-12 DIAGNOSIS — E119 Type 2 diabetes mellitus without complications: Secondary | ICD-10-CM | POA: Insufficient documentation

## 2015-02-12 DIAGNOSIS — Z9221 Personal history of antineoplastic chemotherapy: Secondary | ICD-10-CM | POA: Insufficient documentation

## 2015-02-12 DIAGNOSIS — Z170 Estrogen receptor positive status [ER+]: Secondary | ICD-10-CM | POA: Diagnosis not present

## 2015-02-12 DIAGNOSIS — M8580 Other specified disorders of bone density and structure, unspecified site: Secondary | ICD-10-CM | POA: Diagnosis not present

## 2015-02-12 DIAGNOSIS — I10 Essential (primary) hypertension: Secondary | ICD-10-CM

## 2015-02-12 DIAGNOSIS — C50911 Malignant neoplasm of unspecified site of right female breast: Secondary | ICD-10-CM

## 2015-02-12 DIAGNOSIS — Z923 Personal history of irradiation: Secondary | ICD-10-CM | POA: Diagnosis not present

## 2015-02-12 DIAGNOSIS — Z79811 Long term (current) use of aromatase inhibitors: Secondary | ICD-10-CM | POA: Diagnosis not present

## 2015-02-12 LAB — CBC WITH DIFFERENTIAL/PLATELET
BASOS PCT: 1 %
Basophils Absolute: 0.1 10*3/uL (ref 0–0.1)
EOS PCT: 2 %
Eosinophils Absolute: 0.2 10*3/uL (ref 0–0.7)
HCT: 36.5 % (ref 35.0–47.0)
Hemoglobin: 12.1 g/dL (ref 12.0–16.0)
LYMPHS ABS: 2.3 10*3/uL (ref 1.0–3.6)
LYMPHS PCT: 30 %
MCH: 29.9 pg (ref 26.0–34.0)
MCHC: 33.2 g/dL (ref 32.0–36.0)
MCV: 89.9 fL (ref 80.0–100.0)
Monocytes Absolute: 0.6 10*3/uL (ref 0.2–0.9)
Monocytes Relative: 8 %
Neutro Abs: 4.5 10*3/uL (ref 1.4–6.5)
Neutrophils Relative %: 59 %
Platelets: 429 10*3/uL (ref 150–440)
RBC: 4.06 MIL/uL (ref 3.80–5.20)
RDW: 13.1 % (ref 11.5–14.5)
WBC: 7.6 10*3/uL (ref 3.6–11.0)

## 2015-02-12 LAB — HEPATIC FUNCTION PANEL
ALBUMIN: 4.1 g/dL (ref 3.5–5.0)
ALK PHOS: 49 U/L (ref 38–126)
ALT: 20 U/L (ref 14–54)
AST: 20 U/L (ref 15–41)
Bilirubin, Direct: <0.1 mg/dL — ABNORMAL LOW (ref 0.1–0.5)
TOTAL PROTEIN: 7.1 g/dL (ref 6.5–8.1)
Total Bilirubin: 0.6 mg/dL (ref 0.3–1.2)

## 2015-02-12 LAB — CREATININE, SERUM: Creatinine, Ser: 0.74 mg/dL (ref 0.44–1.00)

## 2015-02-12 NOTE — Progress Notes (Signed)
Pt does not do breast checks on regular basis, has had mammogram at [facility] in the last year.

## 2015-03-03 NOTE — Progress Notes (Signed)
Hannah [name]  [phone] [phone]     ID: ANNICK DIMAIO OB: 1950-12-02  MR#: [id number]  JGO#:[id number]  Patient Care Team: Placido Sou, MD as PCP - General (Family Medicine) Robert Bellow, MD (General Surgery) Albertina Parr, MD as Referring Physician [facility])  CHIEF COMPLAINT/DIAGNOSIS:  T1b N0 M0 (clinical, stage I) infiltrating ductal carcinoma of the right breast status post wide local excision and sentinel node study March 14, 2010. Tumor size 9 mm, grade 2, margins negative.  2 sentinel lymph nodes negative.  ER positive (90%), PR borderline (5%) HER-2/neu 2+ on IHC, negative on FISH. Her2/CEP ratio is 1.45. April 11, 2010 - Oncotype DX score is 35 (high risk group) with average rate of distant recurrence of 24%. Got mammosite.  Patient completed adjuvant chemotherapy with Taxotere/Cytoxan, then radiation, then started aromatase inhibitor in February 2012.   HISTORY OF PRESENT ILLNESS:  Patient returns for continued oncology followup, she was last seen one year ago. States that she is doing steady and denies new complaints. States that the chronic mild left hip area pain is better. Otherwise no other new bone pains. Appetite and weight are steady.  States that she is tolerating anastrozole without new side effects.  Denies bothersome hot flashes.  Denies feeling any new breast masses on self-exam. Denies new mood disturbances.  REVIEW OF SYSTEMS:   ROS As in HPI above. In addition, no fever, chills or sweats. No new headaches or focal weakness.  No sore throat, cough, shortness of breath, sputum, hemoptysis or chest pain. No dizziness or palpitation. No abdominal pain, constipation, diarrhea, dysuria or hematuria. No new skin rash or bleeding symptoms. No new paresthesias in extremities. PS ECOG 0.  PAST MEDICAL HISTORY: Reviewed. Past Medical History  Diagnosis Date  . Hypertension 2001  . History of lumpectomy 2011   Right breast  . Diabetes mellitus without complication   . Cancer 2011     DCIS. 9 mm histologic grade 2 invasive mammary carcinoma, ER 90%, PR 5%, HER-2/neu not over expressed. Sentinel nodes negative.    PAST SURGICAL HISTORY: Reviewed. Past Surgical History  Procedure Laterality Date  . Port-a-cath removal  2012  . Right breast wide excision  2011  . Wrist surgery  1998    pins placed  . Dilation and curettage of uterus  1990  . Colonoscopy  Declines    Reports she has annual stool fecal occult blood testing.  . Breast surgery Right 2011     right breast wide excision, partial breast radiation.    FAMILY HISTORY: Reviewed. Noncontributory, denies history of breast, colon or ovarian malignancies.  SOCIAL HISTORY: Reviewed. History  Substance Use Topics  . Smoking status: Never Smoker   . Smokeless tobacco: Never Used  . Alcohol Use: No    Allergies  Allergen Reactions  . Azithromycin Swelling    Itching, swelling, and rash  . Codeine Other (See Comments)    Mental changes  . Penicillins Itching and Rash    Current Outpatient Prescriptions  Medication Sig Dispense Refill  . anastrozole (ARIMIDEX) 1 MG tablet Take 1 mg by mouth daily.    . carvedilol (COREG) 12.5 MG tablet Take 12.5 mg by mouth 2 (two) times daily with a meal.    . Cholecalciferol (VITAMIN D3) 1000 UNITS CAPS Take 1 capsule by mouth daily.    . fluticasone (FLONASE) 50 MCG/ACT nasal spray Place 1 spray into both nostrils 2 (two) times daily.    [name] GLIPIZIDE  XL 2.5 MG 24 hr tablet Take 1 tablet by mouth daily.    [name] losartan-hydrochlorothiazide (HYZAAR) 50-12.5 MG per tablet Take 1 tablet by mouth daily.    . metFORMIN (GLUCOPHAGE) 500 MG tablet Take 1,000 mg by mouth 2 (two) times daily with a meal.     . Omega-3 Fatty Acids (FISH OIL) 1000 MG CAPS Take 2 capsules by mouth daily.    . simvastatin (ZOCOR) 40 MG tablet Take 40 mg by mouth at bedtime.    . B Complex Vitamins (B-COMPLEX/B-12 PO) Take 1  capsule by mouth daily.    [name] FLUoxetine (PROZAC) 20 MG capsule Take 10 mg by mouth daily. Take 3 capsules daily    . Naproxen Sodium (ALEVE PO) Take by mouth as needed.     No current facility-administered medications for this visit.    PHYSICAL EXAM: Filed Vitals:   02/12/15 1505  BP: 127/94  Pulse: 76  Temp: 98.6 F (37 C)  Resp: 18     Body mass index is 29.62 kg/(m^2).    ECOG FS:0 - Asymptomatic  GENERAL: Patient is alert and oriented and in no acute distress. There is no icterus. HEENT: EOMs intact. Oral exam negative for thrush or lesions. No cervical lymphadenopathy. CVS: S1S2, regular LUNGS: Bilaterally clear to auscultation, no rhonchi. ABDOMEN: Soft, nontender. No hepatomegaly clinically.  EXTREMITIES: No pedal edema. BREASTS: Right breast shows scar tissue under surgical scar, no dominant masses otherwise. No masses in the left breast. No axillary adenopathy on either side.  Exam performed in presence of a nurse   LAB RESULTS:    Component Value Date/Time   CREATININE 0.74 02/12/2015 1426   CREATININE 0.95 08/12/2013 1336   CALCIUM 9.9 02/10/2014 1458   PROT 7.1 02/12/2015 1426   PROT 7.4 02/10/2014 1458   ALBUMIN 4.1 02/12/2015 1426   ALBUMIN 3.8 02/10/2014 1458   AST 20 02/12/2015 1426   AST 17 02/10/2014 1458   ALT 20 02/12/2015 1426   ALT 34 02/10/2014 1458   ALKPHOS 49 02/12/2015 1426   ALKPHOS 69 02/10/2014 1458   BILITOT 0.6 02/12/2015 1426   BILITOT 0.5 02/10/2014 1458   GFRNONAA >60 02/12/2015 1426   GFRNONAA >60 08/12/2013 1336   GFRAA >60 02/12/2015 1426   GFRAA >60 08/12/2013 1336    Lab Results  Component Value Date   WBC 7.6 02/12/2015   NEUTROABS 4.5 02/12/2015   HGB 12.1 02/12/2015   HCT 36.5 02/12/2015   MCV 89.9 02/12/2015   PLT 429 02/12/2015    STUDIES: Right mammogram March 2016.   ASSESSMENT / PLAN:   1. T1b N0 M0 (clinical, stage I) infiltrating ductal carcinoma of the right breast status post wide local excision and  sentinel node study March 14, 2010. Size of primary tumor is 0.9 cm, positive for ER and PR, negative for HER-2/neu, and grade 2 tumor. Two sentinel lymph nodes were also negative for malignancy  - reviewed labs done and d/w patient. She does not have any clinical evidence to suggest recurrent breast cancer. States that recent surveillance right mammogram was unremarkable in March 2016, she gets these done by Dr.Byrnett. She is tolerating hormonal therapy with aromatase inhibitor, continue on Anastrazole 1 mg PO daily. Will see her back in 12 months with repeat labs for continued surveillance. 2. Patient was reminded to continue on Calcium+Vit D twice daily for osteoporosis prevention (Bone density scan October 2012 reported osteopenia, repeat DEXA scan in January 2015 was reported unremarkable).  3. In  between visits, the patient has been advised to call or come to the ER in case of new side effects from anastrozole, any new breast masses felt on self-exam or acute sickness.  She is agreeable to this plan.   Leia Alf, MD   03/03/2015 6:43 AM

## 2015-09-16 DIAGNOSIS — E119 Type 2 diabetes mellitus without complications: Secondary | ICD-10-CM | POA: Diagnosis not present

## 2015-10-04 ENCOUNTER — Other Ambulatory Visit: Payer: Self-pay | Admitting: *Deleted

## 2015-10-04 MED ORDER — ANASTROZOLE 1 MG PO TABS: 1.0000 mg | ORAL_TABLET | Freq: Every day | ORAL | Status: DC

## 2015-10-12 ENCOUNTER — Encounter: Payer: Self-pay | Admitting: General Surgery

## 2015-10-12 DIAGNOSIS — R922 Inconclusive mammogram: Secondary | ICD-10-CM | POA: Diagnosis not present

## 2015-10-12 DIAGNOSIS — Z853 Personal history of malignant neoplasm of breast: Secondary | ICD-10-CM | POA: Diagnosis not present

## 2015-10-19 ENCOUNTER — Encounter: Payer: Self-pay | Admitting: General Surgery

## 2015-10-19 ENCOUNTER — Ambulatory Visit (INDEPENDENT_AMBULATORY_CARE_PROVIDER_SITE_OTHER): Payer: PPO | Admitting: General Surgery

## 2015-10-19 VITALS — BP 118/68 | HR 82 | Resp 16 | Ht 60.0 in | Wt 154.0 lb

## 2015-10-19 DIAGNOSIS — C50911 Malignant neoplasm of unspecified site of right female breast: Secondary | ICD-10-CM | POA: Diagnosis not present

## 2015-10-19 NOTE — Patient Instructions (Signed)
Continue self breast exams. Call office for any new breast issues or concerns. Patient will be asked to return to the office in one year with a bilateral diagnotic mammogram.

## 2015-10-19 NOTE — Progress Notes (Signed)
Patient ID: Hannah [name], female   DOB: 1950-12-04, 65 y.o.   MRN: [id number]  Chief Complaint  Patient presents with  . Follow-up    mammogram    HPI Hannah [name] is a 65 y.o. female.  who presents for followup breast cancer and a breast evaluation. The most recent mammogram was done on 10-12-15.  Patient does perform regular self breast checks and gets regular mammograms done.  No new breast issues. She will finish the present bottle of Arimidex to complete her 5 years of antiestrogen therapy.  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  I person reviewed the patient's history.  HPI  Past Medical History  Diagnosis Date  . Hypertension 2001  . History of lumpectomy 2011    Right breast  . Diabetes mellitus without complication ([place])   . Cancer [facility]) 2011     DCIS. 9 mm histologic grade 2 invasive mammary carcinoma, ER 90%, PR 5%, HER-2/neu not over expressed. Sentinel nodes negative.    Past Surgical History  Procedure Laterality Date  . Port-a-cath removal  2012  . Right breast wide excision  2011  . Wrist surgery  1998    pins placed  . Dilation and curettage of uterus  1990  . Colonoscopy  Declines    Reports she has annual stool fecal occult blood testing.  . Breast surgery Right 2011     right breast wide excision, partial breast radiation.    No family history on file.  Social History Social History  Substance Use Topics  . Smoking status: Never Smoker   . Smokeless tobacco: Never Used  . Alcohol Use: No    Allergies  Allergen Reactions  . Azithromycin Swelling    Itching, swelling, and rash  . Codeine Other (See Comments)    Mental changes  . Penicillins Itching and Rash    Current Outpatient Prescriptions  Medication Sig Dispense Refill  . anastrozole (ARIMIDEX) 1 MG tablet Take 1 tablet (1 mg total) by mouth daily. 90 tablet 1  . carvedilol (COREG) 12.5 MG tablet Take 12.5 mg by mouth 2 (two) times daily with a meal.    . Cholecalciferol  (VITAMIN D3) 1000 UNITS CAPS Take 1 capsule by mouth daily.    [name] FLUoxetine (PROZAC) 20 MG capsule Take 10 mg by mouth daily. Take 3 capsules daily    . fluticasone (FLONASE) 50 MCG/ACT nasal spray Place 1 spray into both nostrils 2 (two) times daily.    [name] GLIPIZIDE XL 2.5 MG 24 hr tablet Take 1 tablet by mouth daily.    [name] losartan-hydrochlorothiazide (HYZAAR) 50-12.5 MG per tablet Take 1 tablet by mouth daily.    . metFORMIN (GLUCOPHAGE) 500 MG tablet Take 1,000 mg by mouth 2 (two) times daily with a meal.     . Multiple Vitamins-Minerals (WOMENS MULTI VITAMIN & MINERAL PO) Take by mouth.    . Omega-3 Fatty Acids (FISH OIL) 1000 MG CAPS Take 2 capsules by mouth daily.    . simvastatin (ZOCOR) 40 MG tablet Take 40 mg by mouth at bedtime.    . sodium chloride (OCEAN) 0.65 % SOLN nasal spray Place 1 spray into both nostrils as needed for congestion.    . vitamin E 400 UNIT capsule Take 400 Units by mouth daily.     No current facility-administered medications for this visit.    Review of Systems Review of Systems  Constitutional: Negative.   Respiratory: Negative.   Cardiovascular: Negative.  Blood pressure 118/68, pulse 82, resp. rate 16, height 5' (1.524 m), weight 154 lb (69.854 kg), last menstrual period 08/30/2003.  Physical Exam Physical Exam  Constitutional: She is oriented to person, place, and time. She appears well-developed and well-nourished.  HENT:  Mouth/Throat: Oropharynx is clear and moist.  Eyes: Conjunctivae are normal. No scleral icterus.  Neck: Neck supple.  Cardiovascular: Normal rate, regular rhythm and normal heart sounds.   Pulmonary/Chest: Effort normal and breath sounds normal. Right breast exhibits no inverted nipple, no mass, no nipple discharge, no skin change and no tenderness. Left breast exhibits no inverted nipple, no mass, no nipple discharge, no skin change and no tenderness.    Well healed incision at 9 o'clock right breast.  Lymphadenopathy:     She has no cervical adenopathy.    She has no axillary adenopathy.  Neurological: She is alert and oriented to person, place, and time.  Skin: Skin is warm and dry.  Psychiatric: Her behavior is normal.    Data Reviewed Bilateral diagnostic mammograms dated 10/12/2015 were reviewed. Scarring architectural distortion in the lateral aspect of the breast at the site of previous surgery. BI-RADS-2.  Assessment    Doing well now 5 years status post wide excision, radiation therapy and adjuvant chemotherapy (elevated Oncotype score)    Plan    The patient will complete her presence supply of a Rheumatrex. Based on the high Oncotype score, will have a sample of her breast tissue sent for breast cancer index assessment to determine if she would benefit from extended antiestrogen therapy.      Patient will be asked to return to the office in one year with a bilateral screening mammogram.    PCP:  Margarita Rana This information has been scribed by Karie Fetch RNBC.   Robert Bellow 10/19/2015, 9:31 PM

## 2015-10-20 ENCOUNTER — Telehealth: Payer: Self-pay | Admitting: *Deleted

## 2015-10-20 NOTE — Telephone Encounter (Signed)
-----   Message from Robert Bellow, MD sent at 10/19/2015  9:35 PM EDT ----- Clabe Seal at the [facility] lab and see what we need to do to have a sample of her tissue sent for breast cancer index assay. [facility]

## 2015-10-20 NOTE — Telephone Encounter (Signed)
[place] aware and information/order faxed to [facility]

## 2015-10-27 DIAGNOSIS — C50911 Malignant neoplasm of unspecified site of right female breast: Secondary | ICD-10-CM | POA: Diagnosis not present

## 2015-11-08 ENCOUNTER — Telehealth: Payer: Self-pay | Admitting: General Surgery

## 2015-11-08 NOTE — Telephone Encounter (Signed)
The breast cancer index recurrence score came back showing the patient had high likelihood of benefit from ongoing estrogen blockade. She is tolerating Aromasin well and will continue with that medication.  Her last bone density was in January 2015 and showed minimal bone loss. A repeat study will be obtained prior to her upcoming visit in spring 2017.  The patient is making use of calcium supplements.

## 2015-11-10 ENCOUNTER — Encounter: Payer: Self-pay | Admitting: General Surgery

## 2016-02-07 ENCOUNTER — Ambulatory Visit (INDEPENDENT_AMBULATORY_CARE_PROVIDER_SITE_OTHER): Payer: PPO | Admitting: Family Medicine

## 2016-02-07 ENCOUNTER — Encounter: Payer: Self-pay | Admitting: Family Medicine

## 2016-02-07 VITALS — BP 122/82 | HR 82 | Temp 98.5°F | Wt 156.2 lb

## 2016-02-07 DIAGNOSIS — Z79899 Other long term (current) drug therapy: Secondary | ICD-10-CM | POA: Diagnosis not present

## 2016-02-07 DIAGNOSIS — Z23 Encounter for immunization: Secondary | ICD-10-CM | POA: Diagnosis not present

## 2016-02-07 DIAGNOSIS — E785 Hyperlipidemia, unspecified: Secondary | ICD-10-CM

## 2016-02-07 DIAGNOSIS — E119 Type 2 diabetes mellitus without complications: Secondary | ICD-10-CM | POA: Diagnosis not present

## 2016-02-07 DIAGNOSIS — I10 Essential (primary) hypertension: Secondary | ICD-10-CM | POA: Diagnosis not present

## 2016-02-07 NOTE — Progress Notes (Signed)
Pre visit review using our clinic review tool, if applicable. No additional management support is needed unless otherwise documented below in the visit note. 

## 2016-02-07 NOTE — Patient Instructions (Signed)
Continue your current medications.  Follow up in 6 months.  Take care  Dr. Lacinda Axon   Health Maintenance, Female Adopting a healthy lifestyle and getting preventive care can go a long way to promote health and wellness. Talk with your health care provider about what schedule of regular examinations is right for you. This is a good chance for you to check in with your provider about disease prevention and staying healthy. In between checkups, there are plenty of things you can do on your own. Experts have done a lot of research about which lifestyle changes and preventive measures are most likely to keep you healthy. Ask your health care provider for more information. WEIGHT AND DIET  Eat a healthy diet  Be sure to include plenty of vegetables, fruits, low-fat dairy products, and lean protein.  Do not eat a lot of foods high in solid fats, added sugars, or salt.  Get regular exercise. This is one of the most important things you can do for your health.  Most adults should exercise for at least 150 minutes each week. The exercise should increase your heart rate and make you sweat (moderate-intensity exercise).  Most adults should also do strengthening exercises at least twice a week. This is in addition to the moderate-intensity exercise.  Maintain a healthy weight  Body mass index (BMI) is a measurement that can be used to identify possible weight problems. It estimates body fat based on height and weight. Your health care provider can help determine your BMI and help you achieve or maintain a healthy weight.  For females 34 years of age and older:   A BMI below 18.5 is considered underweight.  A BMI of 18.5 to 24.9 is normal.  A BMI of 25 to 29.9 is considered overweight.  A BMI of 30 and above is considered obese.  Watch levels of cholesterol and blood lipids  You should start having your blood tested for lipids and cholesterol at 65 years of age, then have this test every 5  years.  You may need to have your cholesterol levels checked more often if:  Your lipid or cholesterol levels are high.  You are older than 65 years of age.  You are at high risk for heart disease.  CANCER SCREENING   Lung Cancer  Lung cancer screening is recommended for adults 3-82 years old who are at high risk for lung cancer because of a history of smoking.  A yearly low-dose CT scan of the lungs is recommended for people who:  Currently smoke.  Have quit within the past 15 years.  Have at least a 30-pack-year history of smoking. A pack year is smoking an average of one pack of cigarettes a day for 1 year.  Yearly screening should continue until it has been 15 years since you quit.  Yearly screening should stop if you develop a health problem that would prevent you from having lung cancer treatment.  Breast Cancer  Practice breast self-awareness. This means understanding how your breasts normally appear and feel.  It also means doing regular breast self-exams. Let your health care provider know about any changes, no matter how small.  If you are in your 20s or 30s, you should have a clinical breast exam (CBE) by a health care provider every 1-3 years as part of a regular health exam.  If you are 78 or older, have a CBE every year. Also consider having a breast X-ray (mammogram) every year.  If you have  a family history of breast cancer, talk to your health care provider about genetic screening.  If you are at high risk for breast cancer, talk to your health care provider about having an MRI and a mammogram every year.  Breast cancer gene (BRCA) assessment is recommended for women who have family members with BRCA-related cancers. BRCA-related cancers include:  Breast.  Ovarian.  Tubal.  Peritoneal cancers.  Results of the assessment will determine the need for genetic counseling and BRCA1 and BRCA2 testing. Cervical Cancer Your health care provider may  recommend that you be screened regularly for cancer of the pelvic organs (ovaries, uterus, and vagina). This screening involves a pelvic examination, including checking for microscopic changes to the surface of your cervix (Pap test). You may be encouraged to have this screening done every 3 years, beginning at age 21.  For women ages 30-65, health care providers may recommend pelvic exams and Pap testing every 3 years, or they may recommend the Pap and pelvic exam, combined with testing for human papilloma virus (HPV), every 5 years. Some types of HPV increase your risk of cervical cancer. Testing for HPV may also be done on women of any age with unclear Pap test results.  Other health care providers may not recommend any screening for nonpregnant women who are considered low risk for pelvic cancer and who do not have symptoms. Ask your health care provider if a screening pelvic exam is right for you.  If you have had past treatment for cervical cancer or a condition that could lead to cancer, you need Pap tests and screening for cancer for at least 20 years after your treatment. If Pap tests have been discontinued, your risk factors (such as having a new sexual partner) need to be reassessed to determine if screening should resume. Some women have medical problems that increase the chance of getting cervical cancer. In these cases, your health care provider may recommend more frequent screening and Pap tests. Colorectal Cancer  This type of cancer can be detected and often prevented.  Routine colorectal cancer screening usually begins at 65 years of age and continues through 65 years of age.  Your health care provider may recommend screening at an earlier age if you have risk factors for colon cancer.  Your health care provider may also recommend using home test kits to check for hidden blood in the stool.  A small camera at the end of a tube can be used to examine your colon directly  (sigmoidoscopy or colonoscopy). This is done to check for the earliest forms of colorectal cancer.  Routine screening usually begins at age 50.  Direct examination of the colon should be repeated every 5-10 years through 65 years of age. However, you may need to be screened more often if early forms of precancerous polyps or small growths are found. Skin Cancer  Check your skin from head to toe regularly.  Tell your health care provider about any new moles or changes in moles, especially if there is a change in a mole's shape or color.  Also tell your health care provider if you have a mole that is larger than the size of a pencil eraser.  Always use sunscreen. Apply sunscreen liberally and repeatedly throughout the day.  Protect yourself by wearing long sleeves, pants, a wide-brimmed hat, and sunglasses whenever you are outside. HEART DISEASE, DIABETES, AND HIGH BLOOD PRESSURE   High blood pressure causes heart disease and increases the risk of stroke. High   blood pressure is more likely to develop in:  People who have blood pressure in the high end of the normal range (130-139/85-89 mm Hg).  People who are overweight or obese.  People who are African American.  If you are 18-39 years of age, have your blood pressure checked every 3-5 years. If you are 40 years of age or older, have your blood pressure checked every year. You should have your blood pressure measured twice--once when you are at a hospital or clinic, and once when you are not at a hospital or clinic. Record the average of the two measurements. To check your blood pressure when you are not at a hospital or clinic, you can use:  An automated blood pressure machine at a pharmacy.  A home blood pressure monitor.  If you are between 55 years and 79 years old, ask your health care provider if you should take aspirin to prevent strokes.  Have regular diabetes screenings. This involves taking a blood sample to check your  fasting blood sugar level.  If you are at a normal weight and have a low risk for diabetes, have this test once every three years after 65 years of age.  If you are overweight and have a high risk for diabetes, consider being tested at a younger age or more often. PREVENTING INFECTION  Hepatitis B  If you have a higher risk for hepatitis B, you should be screened for this virus. You are considered at high risk for hepatitis B if:  You were born in a country where hepatitis B is common. Ask your health care provider which countries are considered high risk.  Your parents were born in a high-risk country, and you have not been immunized against hepatitis B (hepatitis B vaccine).  You have HIV or AIDS.  You use needles to inject street drugs.  You live with someone who has hepatitis B.  You have had sex with someone who has hepatitis B.  You get hemodialysis treatment.  You take certain medicines for conditions, including cancer, organ transplantation, and autoimmune conditions. Hepatitis C  Blood testing is recommended for:  Everyone born from 1945 through 1965.  Anyone with known risk factors for hepatitis C. Sexually transmitted infections (STIs)  You should be screened for sexually transmitted infections (STIs) including gonorrhea and chlamydia if:  You are sexually active and are younger than 65 years of age.  You are older than 65 years of age and your health care provider tells you that you are at risk for this type of infection.  Your sexual activity has changed since you were last screened and you are at an increased risk for chlamydia or gonorrhea. Ask your health care provider if you are at risk.  If you do not have HIV, but are at risk, it may be recommended that you take a prescription medicine daily to prevent HIV infection. This is called pre-exposure prophylaxis (PrEP). You are considered at risk if:  You are sexually active and do not regularly use condoms or  know the HIV status of your partner(s).  You take drugs by injection.  You are sexually active with a partner who has HIV. Talk with your health care provider about whether you are at high risk of being infected with HIV. If you choose to begin PrEP, you should first be tested for HIV. You should then be tested every 3 months for as long as you are taking PrEP.  PREGNANCY   If you are   premenopausal and you may become pregnant, ask your health care provider about preconception counseling.  If you may become pregnant, take 400 to 800 micrograms (mcg) of folic acid every day.  If you want to prevent pregnancy, talk to your health care provider about birth control (contraception). OSTEOPOROSIS AND MENOPAUSE   Osteoporosis is a disease in which the bones lose minerals and strength with aging. This can result in serious bone fractures. Your risk for osteoporosis can be identified using a bone density scan.  If you are 51 years of age or older, or if you are at risk for osteoporosis and fractures, ask your health care provider if you should be screened.  Ask your health care provider whether you should take a calcium or vitamin D supplement to lower your risk for osteoporosis.  Menopause may have certain physical symptoms and risks.  Hormone replacement therapy may reduce some of these symptoms and risks. Talk to your health care provider about whether hormone replacement therapy is right for you.  HOME CARE INSTRUCTIONS   Schedule regular health, dental, and eye exams.  Stay current with your immunizations.   Do not use any tobacco products including cigarettes, chewing tobacco, or electronic cigarettes.  If you are pregnant, do not drink alcohol.  If you are breastfeeding, limit how much and how often you drink alcohol.  Limit alcohol intake to no more than 1 drink per day for nonpregnant women. One drink equals 12 ounces of beer, 5 ounces of wine, or 1 ounces of hard liquor.  Do  not use street drugs.  Do not share needles.  Ask your health care provider for help if you need support or information about quitting drugs.  Tell your health care provider if you often feel depressed.  Tell your health care provider if you have ever been abused or do not feel safe at home.   This information is not intended to replace advice given to you by your health care provider. Make sure you discuss any questions you have with your health care provider.   Document Released: 01/30/2011 Document Revised: 08/07/2014 Document Reviewed: 06/18/2013 Elsevier Interactive Patient Education [employer].

## 2016-02-08 ENCOUNTER — Other Ambulatory Visit: Payer: PPO

## 2016-02-08 DIAGNOSIS — E785 Hyperlipidemia, unspecified: Secondary | ICD-10-CM | POA: Insufficient documentation

## 2016-02-08 DIAGNOSIS — E119 Type 2 diabetes mellitus without complications: Secondary | ICD-10-CM | POA: Insufficient documentation

## 2016-02-08 LAB — COMPREHENSIVE METABOLIC PANEL
ALT: 19 U/L (ref 0–35)
AST: 16 U/L (ref 0–37)
Albumin: 4.2 g/dL (ref 3.5–5.2)
Alkaline Phosphatase: 51 U/L (ref 39–117)
BILIRUBIN TOTAL: 0.4 mg/dL (ref 0.2–1.2)
BUN: 16 mg/dL (ref 6–23)
CO2: 29 mEq/L (ref 19–32)
CREATININE: 0.86 mg/dL (ref 0.40–1.20)
Calcium: 9.9 mg/dL (ref 8.4–10.5)
Chloride: 101 mEq/L (ref 96–112)
GFR: 70.31 mL/min (ref >60.00)
Glucose, Bld: 75 mg/dL (ref 70–99)
Potassium: 3.9 mEq/L (ref 3.5–5.1)
SODIUM: 139 meq/L (ref 135–145)
TOTAL PROTEIN: 6.9 g/dL (ref 6.0–8.3)

## 2016-02-08 LAB — LIPID PANEL
CHOLESTEROL: 156 mg/dL (ref 0–200)
HDL: 58.3 mg/dL (ref >39.00)
NonHDL: 97.58
TRIGLYCERIDES: 212 mg/dL — AB (ref 0.0–149.0)
Total CHOL/HDL Ratio: 3
VLDL: 42.4 mg/dL — ABNORMAL HIGH (ref 0.0–40.0)

## 2016-02-08 LAB — CBC
HEMATOCRIT: 36.2 % (ref 36.0–46.0)
HEMOGLOBIN: 12.1 g/dL (ref 12.0–15.0)
MCHC: 33.5 g/dL (ref 30.0–36.0)
MCV: 89.6 fl (ref 78.0–100.0)
Platelets: 445 10*3/uL — ABNORMAL HIGH (ref 150.0–400.0)
RBC: 4.04 Mil/uL (ref 3.87–5.11)
RDW: 13.4 % (ref 11.5–15.5)
WBC: 8.7 10*3/uL (ref 4.0–10.5)

## 2016-02-08 LAB — MICROALBUMIN / CREATININE URINE RATIO
CREATININE, U: 188.2 mg/dL
MICROALB/CREAT RATIO: 0.8 mg/g (ref 0.0–30.0)
Microalb, Ur: 1.5 mg/dL (ref 0.0–1.9)

## 2016-02-08 LAB — LDL CHOLESTEROL, DIRECT: LDL DIRECT: 74 mg/dL

## 2016-02-08 LAB — HEMOGLOBIN A1C: Hgb A1c MFr Bld: 7.2 % — ABNORMAL HIGH (ref 4.6–6.5)

## 2016-02-08 NOTE — Assessment & Plan Note (Addendum)
Lipid panel revealed good control. Continue Zocor.

## 2016-02-08 NOTE — Assessment & Plan Note (Signed)
At goal. Continue Losartan/HCTZ & Coreg.

## 2016-02-08 NOTE — Assessment & Plan Note (Signed)
A1C 7.2. Continue metformin. Increase Glipizide.

## 2016-02-08 NOTE — Progress Notes (Signed)
Subjective:  Patient ID: Hannah [name], female    DOB: Nov 15, 1950  Age: 65 y.o. MRN: [id number]  CC: Establish care  HPI Hannah [name] is a 65 y.o. female presents to the clinic today to establish care. Issues/concerns are below.  Hypertension  Well controlled on Losartan/HCTZ and Coreg.  No reported side effects from meds.  Hyperlipidemia  Unsure of control.  Currently on Zocor.   Needs lipid panel today.   DM-2  Blood sugars readings - Does not check.  Hypoglycemia - No.  Medications - Metformin & Glipizide.  Adverse effects - Non3.  Compliance - Yes. Preventative care  Eye exam - Unsure of when; [facility].  Foot exam - Unsure.  Last A1C - Needs A1C today.  Urine microalbumin - Needs today.  PMH, Surgical Hx, Family Hx, Social History reviewed and updated as below.  Past Medical History  Diagnosis Date  . Hypertension 2001  . History of lumpectomy 2011    Right breast  . Diabetes mellitus without complication ([place])   . Cancer [facility]) 2011     DCIS. 9 mm histologic grade 2 invasive mammary carcinoma, ER 90%, PR 5%, HER-2/neu not over expressed. Sentinel nodes negative.  . Chicken pox   . Allergy   . Hyperlipidemia    Past Surgical History  Procedure Laterality Date  . Port-a-cath removal  2012  . Right breast wide excision  2011  . Wrist surgery  1998    pins placed  . Dilation and curettage of uterus  1990  . Colonoscopy  Declines    Reports she has annual stool fecal occult blood testing.  . Breast surgery Right 2011     right breast wide excision, partial breast radiation.   Family History  Problem Relation Age of Onset  . Arthritis Mother   . Alzheimer's disease Mother   . Arthritis Father   . Heart disease Father   . Arthritis Maternal Grandmother   . Breast cancer Maternal Grandmother   . Alzheimer's disease Maternal Grandmother   . Stroke Maternal Grandfather    Social History  Substance Use Topics  . Smoking status:  Never Smoker   . Smokeless tobacco: Never Used  . Alcohol Use: No   Review of Systems General: Denies unexplained weight loss, fever. Skin: Denies new or changing mole, sore/wound that won't heal. ENT: Trouble hearing, ringing in the ears, sores in the mouth, hoarseness, trouble swallowing. Eyes: Denies trouble seeing/visual disturbance. Heart/CV: Denies chest pain, shortness of breath, edema, palpitations. Lungs/Resp: Denies cough, shortness of breath, hemoptysis. Abd/GI: Denies nausea, vomiting, diarrhea, constipation, abdominal pain, hematochezia, melena. GU: Denies dysuria, incontinence, hematuria, urinary frequency, difficulty starting/keeping stream, vaginal discharge, sexual difficulty, lump in breasts. MSK: Denies joint pain/swelling, myalgias. Neuro: Denies headaches, weakness, numbness, dizziness, syncope. Psych: Denies sadness, anxiety, stress, memory difficulty. Endocrine: Denies polyuria and polydipsia.  Objective:   Today's Vitals: BP 122/82 mmHg  Pulse 82  Temp(Src) 98.5 F (36.9 C) (Oral)  Wt 156 lb 4 oz (70.875 kg)  SpO2 96%  LMP 08/30/2003  Physical Exam  Constitutional: She is oriented to person, place, and time. She appears well-developed and well-nourished. No distress.  HENT:  Head: Normocephalic and atraumatic.  Nose: Nose normal.  Mouth/Throat: Oropharynx is clear and moist. No oropharyngeal exudate.  Normal TM's bilaterally.   Eyes: Conjunctivae are normal. No scleral icterus.  Neck: Neck supple. No thyromegaly present.  Cardiovascular: Normal rate and regular rhythm.   No murmur heard. Pulmonary/Chest: Effort normal and  breath sounds normal. She has no wheezes. She has no rales.  Abdominal: Soft. She exhibits no distension. There is no tenderness. There is no rebound and no guarding.  Musculoskeletal: Normal range of motion. She exhibits no edema.  Lymphadenopathy:    She has no cervical adenopathy.  Neurological: She is alert and oriented to  person, place, and time.  Skin: Skin is warm and dry. No rash noted.  Psychiatric: She has a normal mood and affect.  Vitals reviewed.  Assessment & Plan:   Problem List Items Addressed This Visit    Hypertension - Primary    At goal. Continue Losartan/HCTZ & Coreg.      Relevant Orders   Comp Met (CMET) (Completed)   Hyperlipidemia    Lipid panel revealed good control. Continue Zocor.      DM type 2 (diabetes mellitus, type 2) (HCC)    A1C 7.2. Continue metformin. Increase Glipizide.      Relevant Orders   Lipid Profile (Completed)   HgB A1c (Completed)   Urine Microalbumin w/creat. ratio (Completed)    Other Visit Diagnoses    Long term use of drug        Relevant Orders    CBC (Completed)    Need for pneumococcal vaccine        Relevant Orders    Pneumococcal conjugate vaccine 13-valent (Completed)       Outpatient Encounter Prescriptions as of 02/07/2016  Medication Sig  . anastrozole (ARIMIDEX) 1 MG tablet Take 1 tablet (1 mg total) by mouth daily.  . carvedilol (COREG) 12.5 MG tablet Take 12.5 mg by mouth 2 (two) times daily with a meal.  . Cholecalciferol (VITAMIN D3) 1000 UNITS CAPS Take 1 capsule by mouth daily.  [name] FLUoxetine (PROZAC) 20 MG capsule Take 10 mg by mouth daily. Take 3 capsules daily  . fluticasone (FLONASE) 50 MCG/ACT nasal spray Place 1 spray into both nostrils 2 (two) times daily.  [name] GLIPIZIDE XL 2.5 MG 24 hr tablet Take 1 tablet by mouth daily.  [name] losartan-hydrochlorothiazide (HYZAAR) 50-12.5 MG per tablet Take 1 tablet by mouth daily.  . metFORMIN (GLUCOPHAGE) 500 MG tablet Take 1,000 mg by mouth 2 (two) times daily with a meal.   . Multiple Vitamins-Minerals (WOMENS MULTI VITAMIN & MINERAL PO) Take by mouth.  . Omega-3 Fatty Acids (FISH OIL) 1000 MG CAPS Take 2 capsules by mouth daily.  . simvastatin (ZOCOR) 40 MG tablet Take 40 mg by mouth at bedtime.  . sodium chloride (OCEAN) 0.65 % SOLN nasal spray Place 1 spray into both nostrils as  needed for congestion.  . vitamin E 400 UNIT capsule Take 400 Units by mouth daily.   No facility-administered encounter medications on file as of 02/07/2016.    Follow-up: 6 months   [place] DO [facility]

## 2016-02-18 ENCOUNTER — Ambulatory Visit: Payer: BLUE CROSS/BLUE SHIELD | Admitting: Internal Medicine

## 2016-02-18 ENCOUNTER — Other Ambulatory Visit: Payer: BLUE CROSS/BLUE SHIELD

## 2016-03-02 ENCOUNTER — Encounter (INDEPENDENT_AMBULATORY_CARE_PROVIDER_SITE_OTHER): Payer: Self-pay

## 2016-03-02 ENCOUNTER — Inpatient Hospital Stay: Payer: PPO | Attending: Internal Medicine | Admitting: Internal Medicine

## 2016-03-02 ENCOUNTER — Inpatient Hospital Stay: Payer: PPO

## 2016-03-02 DIAGNOSIS — E119 Type 2 diabetes mellitus without complications: Secondary | ICD-10-CM | POA: Insufficient documentation

## 2016-03-02 DIAGNOSIS — Z170 Estrogen receptor positive status [ER+]: Secondary | ICD-10-CM | POA: Diagnosis not present

## 2016-03-02 DIAGNOSIS — E785 Hyperlipidemia, unspecified: Secondary | ICD-10-CM | POA: Diagnosis not present

## 2016-03-02 DIAGNOSIS — I10 Essential (primary) hypertension: Secondary | ICD-10-CM | POA: Insufficient documentation

## 2016-03-02 DIAGNOSIS — C50811 Malignant neoplasm of overlapping sites of right female breast: Secondary | ICD-10-CM | POA: Insufficient documentation

## 2016-03-02 DIAGNOSIS — Z9221 Personal history of antineoplastic chemotherapy: Secondary | ICD-10-CM | POA: Insufficient documentation

## 2016-03-02 DIAGNOSIS — Z79811 Long term (current) use of aromatase inhibitors: Secondary | ICD-10-CM

## 2016-03-02 DIAGNOSIS — Z79899 Other long term (current) drug therapy: Secondary | ICD-10-CM | POA: Insufficient documentation

## 2016-03-02 HISTORY — DX: Malignant neoplasm of overlapping sites of right female breast: C50.811

## 2016-03-02 NOTE — Progress Notes (Signed)
RN Chaperoned provider with Breast Exam.   

## 2016-03-02 NOTE — Progress Notes (Signed)
[facility] OFFICE PROGRESS NOTE  Patient Care Team: Coral Spikes, DO as PCP - General (Family Medicine) Robert Bellow, MD (General Surgery) Albertina Parr, MD as Referring Physician [facility])  No matching staging information was found for the patient.   Oncology History   T1b N0 M0 (clinical, stage I) infiltrating ductal carcinoma of the right breast status post wide local excision and sentinel node study March 14, 2010. Tumor size 9 mm, grade 2, margins negative.  2 sentinel lymph nodes negative.  ER positive (90%), PR borderline (5%) HER-2/neu 2+ on IHC, negative on FISH. Her2/CEP ratio is 1.45. April 11, 2010 - Oncotype DX score is 35 (high risk group) with average rate of distant recurrence of 24%. Got mammosite.  Patient completed adjuvant chemotherapy with Taxotere/Cytoxan, then radiation, then started aromatase inhibitor in February 2012.     Malignant neoplasm of overlapping sites of right breast ([place])   03/02/2016 Initial Diagnosis    Malignant neoplasm of overlapping sites of right breast [facility])       This is my first interaction with the patient as patient's primary oncologist has been Dr.Pandit I reviewed the patient's prior charts/pertinent labs/imaging in detail; findings are summarized above.    INTERVAL HISTORY:  Hannah [name] 65 y.o.  female pleasant patient above history of Stage I breast cancer ER/PR positive high risk Oncotype- currently on Arimidex is here for follow-up.  Patient denies any lumps or bumps. Has chronic arthritis of her hands and feet.  Denies any chest pain or shortness of breath or cough.  REVIEW OF SYSTEMS:  A complete 10 point review of system is done which is negative except mentioned above/history of present illness.   PAST MEDICAL HISTORY :  Past Medical History:  Diagnosis Date  . Allergy   . Cancer [facility]) 2011    DCIS. 9 mm histologic grade 2 invasive mammary carcinoma, ER 90%, PR 5%, HER-2/neu not  over expressed. Sentinel nodes negative.  . Chicken pox   . Diabetes mellitus without complication ([place])   . History of lumpectomy 2011   Right breast  . Hyperlipidemia   . Hypertension 2001    PAST SURGICAL HISTORY :   Past Surgical History:  Procedure Laterality Date  . BREAST SURGERY Right 2011    right breast wide excision, partial breast radiation.  . COLONOSCOPY  Declines   Reports she has annual stool fecal occult blood testing.  [name] DILATION AND CURETTAGE OF UTERUS  1990  . PORT-A-CATH REMOVAL  2012  . right breast wide excision  2011  . WRIST SURGERY  1998   pins placed    FAMILY HISTORY :   Family History  Problem Relation Age of Onset  . Arthritis Mother   . Alzheimer's disease Mother   . Arthritis Father   . Heart disease Father   . Arthritis Maternal Grandmother   . Breast cancer Maternal Grandmother   . Alzheimer's disease Maternal Grandmother   . Stroke Maternal Grandfather     SOCIAL HISTORY:   Social History  Substance Use Topics  . Smoking status: Never Smoker  . Smokeless tobacco: Never Used  . Alcohol use No    ALLERGIES:  is allergic to azithromycin; codeine; and penicillins.  MEDICATIONS:  Current Outpatient Prescriptions  Medication Sig Dispense Refill  . anastrozole (ARIMIDEX) 1 MG tablet Take 1 tablet (1 mg total) by mouth daily. 90 tablet 1  . carvedilol (COREG) 12.5 MG tablet Take 12.5 mg by mouth 2 (  two) times daily with a meal.    . Cholecalciferol (VITAMIN D3) 1000 UNITS CAPS Take 1 capsule by mouth daily.    [name] FLUoxetine (PROZAC) 20 MG capsule Take 10 mg by mouth daily. Take 3 capsules daily    . fluticasone (FLONASE) 50 MCG/ACT nasal spray Place 1 spray into both nostrils 2 (two) times daily.    [name] GLIPIZIDE XL 2.5 MG 24 hr tablet Take 1 tablet by mouth daily.    [name] losartan-hydrochlorothiazide (HYZAAR) 50-12.5 MG per tablet Take 1 tablet by mouth daily.    . metFORMIN (GLUCOPHAGE) 500 MG tablet Take 1,000 mg by mouth 2 (two) times  daily with a meal.     . Multiple Vitamins-Minerals (WOMENS MULTI VITAMIN & MINERAL PO) Take by mouth.    . simvastatin (ZOCOR) 40 MG tablet Take 40 mg by mouth at bedtime.    . sodium chloride (OCEAN) 0.65 % SOLN nasal spray Place 1 spray into both nostrils as needed for congestion.    . vitamin E 400 UNIT capsule Take 400 Units by mouth daily.     No current facility-administered medications for this visit.     PHYSICAL EXAMINATION: ECOG PERFORMANCE STATUS: 0 - Asymptomatic  BP 135/83 (BP Location: Left Arm, Patient Position: Sitting)   Pulse 84   Temp 98.9 F (37.2 C) (Tympanic)   Resp 18   Wt 153 lb 8 oz (69.6 kg)   LMP 08/30/2003   BMI 29.98 kg/m   Filed Weights   03/02/16 1505  Weight: 153 lb 8 oz (69.6 kg)    GENERAL: Well-nourished well-developed; Alert, no distress and comfortable.   Alone. EYES: no pallor or icterus OROPHARYNX: no thrush or ulceration; good dentition  NECK: supple, no masses felt LYMPH:  no palpable lymphadenopathy in the cervical, axillary or inguinal regions LUNGS: clear to auscultation and  No wheeze or crackles HEART/CVS: regular rate & rhythm and no murmurs; No lower extremity edema ABDOMEN:abdomen soft, non-tender and normal bowel sounds Musculoskeletal:no cyanosis of digits and no clubbing  PSYCH: alert & oriented x 3 with fluent speech NEURO: no focal motor/sensory deficits SKIN:  no rashes or significant lesions  Right and left BREAST exam [in the presence of nurse]- no unusual skin changes [radiation induced telangiectasia noted] or dominant masses felt. Surgical scars noted.    LABORATORY DATA:  I have reviewed the data as listed    Component Value Date/Time   NA 139 02/07/2016 1508   K 3.9 02/07/2016 1508   CL 101 02/07/2016 1508   CO2 29 02/07/2016 1508   GLUCOSE 75 02/07/2016 1508   BUN 16 02/07/2016 1508   CREATININE 0.86 02/07/2016 1508   CREATININE 0.95 08/12/2013 1336   CALCIUM 9.9 02/07/2016 1508   CALCIUM 9.9  02/10/2014 1458   PROT 6.9 02/07/2016 1508   PROT 7.4 02/10/2014 1458   ALBUMIN 4.2 02/07/2016 1508   ALBUMIN 3.8 02/10/2014 1458   AST 16 02/07/2016 1508   AST 17 02/10/2014 1458   ALT 19 02/07/2016 1508   ALT 34 02/10/2014 1458   ALKPHOS 51 02/07/2016 1508   ALKPHOS 69 02/10/2014 1458   BILITOT 0.4 02/07/2016 1508   BILITOT 0.5 02/10/2014 1458   GFRNONAA >60 02/12/2015 1426   GFRNONAA >60 08/12/2013 1336   GFRAA >60 02/12/2015 1426   GFRAA >60 08/12/2013 1336    No results found for: SPEP, UPEP  Lab Results  Component Value Date   WBC 8.7 02/07/2016   NEUTROABS 4.5 02/12/2015  HGB 12.1 02/07/2016   HCT 36.2 02/07/2016   MCV 89.6 02/07/2016   PLT 445.0 (H) 02/07/2016      Chemistry      Component Value Date/Time   NA 139 02/07/2016 1508   K 3.9 02/07/2016 1508   CL 101 02/07/2016 1508   CO2 29 02/07/2016 1508   BUN 16 02/07/2016 1508   CREATININE 0.86 02/07/2016 1508   CREATININE 0.95 08/12/2013 1336      Component Value Date/Time   CALCIUM 9.9 02/07/2016 1508   CALCIUM 9.9 02/10/2014 1458   ALKPHOS 51 02/07/2016 1508   ALKPHOS 69 02/10/2014 1458   AST 16 02/07/2016 1508   AST 17 02/10/2014 1458   ALT 19 02/07/2016 1508   ALT 34 02/10/2014 1458   BILITOT 0.4 02/07/2016 1508   BILITOT 0.5 02/10/2014 1458       RADIOGRAPHIC STUDIES: I have personally reviewed the radiological images as listed and agreed with the findings in the report. No results found.   ASSESSMENT & PLAN:  Malignant neoplasm of overlapping sites of right breast ([place]) # STAGE I Breast ca ER/PR-Pos; her 2 Neu NEG. clinically no evidence of recurrence. Patient Arimidex/extended duration [10 years as per Dr.Byrnett]  # arthitis sec to AI- start chondriotin sulfate/ glucosamine.   # BMD- 2016-Ca+ vit D  # follow up in 12 months/ no labs.    No orders of the defined types were placed in this encounter.  All questions were answered. The patient knows to call the clinic with any  problems, questions or concerns.      Cammie Sickle, MD 03/02/2016 4:46 PM

## 2016-03-02 NOTE — Assessment & Plan Note (Addendum)
#   STAGE I Breast ca ER/PR-Pos; her 2 Neu NEG. clinically no evidence of recurrence. Patient Arimidex/extended duration [10 years as per Dr.Byrnett]  # arthitis sec to AI- start chondriotin sulfate/ glucosamine.   # BMD- 2016-Ca+ vit D  # follow up in 12 months/ no labs.

## 2016-03-14 ENCOUNTER — Telehealth: Payer: Self-pay | Admitting: Family Medicine

## 2016-03-14 MED ORDER — GLIPIZIDE ER 2.5 MG PO TB24: 2.5000 mg | ORAL_TABLET | Freq: Every day | ORAL | 3 refills | Status: DC

## 2016-03-14 NOTE — Telephone Encounter (Signed)
GLIPIZIDE XL 2.5 MG 24 hr tablet  Sent to [facility] [address]

## 2016-03-14 NOTE — Telephone Encounter (Signed)
Rx sent, thanks 

## 2016-04-06 ENCOUNTER — Other Ambulatory Visit: Payer: Self-pay | Admitting: Family Medicine

## 2016-04-08 ENCOUNTER — Other Ambulatory Visit: Payer: Self-pay | Admitting: Internal Medicine

## 2016-04-25 ENCOUNTER — Other Ambulatory Visit: Payer: Self-pay | Admitting: Family Medicine

## 2016-05-11 ENCOUNTER — Other Ambulatory Visit: Payer: Self-pay | Admitting: Family Medicine

## 2016-06-15 ENCOUNTER — Other Ambulatory Visit: Payer: Self-pay | Admitting: Family Medicine

## 2016-06-15 DIAGNOSIS — C50911 Malignant neoplasm of unspecified site of right female breast: Secondary | ICD-10-CM

## 2016-06-27 ENCOUNTER — Other Ambulatory Visit: Payer: Self-pay | Admitting: Family Medicine

## 2016-06-27 NOTE — Telephone Encounter (Signed)
Last filled by historical provider. Last OV 02/07/16. Prozac was not discussed at last OV. Has follow up OV on 08/09/16.

## 2016-07-01 ENCOUNTER — Other Ambulatory Visit: Payer: Self-pay | Admitting: Family Medicine

## 2016-07-02 ENCOUNTER — Other Ambulatory Visit: Payer: Self-pay | Admitting: Family Medicine

## 2016-08-09 ENCOUNTER — Ambulatory Visit (INDEPENDENT_AMBULATORY_CARE_PROVIDER_SITE_OTHER): Payer: PPO | Admitting: Family Medicine

## 2016-08-09 ENCOUNTER — Encounter: Payer: Self-pay | Admitting: Family Medicine

## 2016-08-09 VITALS — BP 108/72 | HR 96 | Temp 98.5°F | Resp 14 | Wt 144.2 lb

## 2016-08-09 DIAGNOSIS — J069 Acute upper respiratory infection, unspecified: Secondary | ICD-10-CM | POA: Diagnosis not present

## 2016-08-09 DIAGNOSIS — I10 Essential (primary) hypertension: Secondary | ICD-10-CM | POA: Diagnosis not present

## 2016-08-09 DIAGNOSIS — E119 Type 2 diabetes mellitus without complications: Secondary | ICD-10-CM

## 2016-08-09 LAB — HEMOGLOBIN A1C: HEMOGLOBIN A1C: 7.1 % — AB (ref 4.6–6.5)

## 2016-08-09 MED ORDER — DOXYCYCLINE HYCLATE 100 MG PO TABS: 100.0000 mg | ORAL_TABLET | Freq: Two times a day (BID) | ORAL | 0 refills | Status: DC

## 2016-08-09 MED ORDER — HYDROCOD POLST-CPM POLST ER 10-8 MG/5ML PO SUER: 5.0000 mL | Freq: Two times a day (BID) | ORAL | 0 refills | Status: DC | PRN

## 2016-08-09 NOTE — Patient Instructions (Signed)
Follow up in 3-6 months.  Medications as prescribed.  Take care  Dr. Lacinda Axon

## 2016-08-09 NOTE — Assessment & Plan Note (Signed)
Treating with tussionex and doxy.

## 2016-08-09 NOTE — Assessment & Plan Note (Signed)
At goal. Continue Coreg, losartan/HCTZ.

## 2016-08-09 NOTE — Progress Notes (Signed)
Subjective:  Patient ID: Hannah [name], female    DOB: [birth year]  Age: 66 y.o. MRN: [id number]  CC: Follow up, URI/Cough  HPI:  66 year old female with hypertension, hyper lipidemia, DM 2, history of breast cancer presents for follow-up. She also has an acute issue.  DM 2  Not checking sugars.  Compliant with Metformin and Glipizide (has increased per my recommendations).  A1C today.  HTN  At goal on Coreg, losartan/HCTZ.  URI  Patient states she's been sick for a week.  She's had sore throat, cough, sneezing, headache, dental pain.  Most troublesome symptom at this time is cough.  Cough is mildly productive.  No fever.  She has had chills.  She's been taking Mucinex with no improvement.  No known exacerbating factors. No other complaints or concerns at this time.  Social Hx   Social History   Social History  . Marital status: Widowed    Spouse name: N/A  . Number of children: N/A  . Years of education: N/A   Social History Main Topics  . Smoking status: Never Smoker  . Smokeless tobacco: Never Used  . Alcohol use No  . Drug use: No  . Sexual activity: Not Asked   Other Topics Concern  . None   Social History Narrative  . None    Review of Systems  Constitutional: Positive for chills.  HENT: Positive for ear pain, sinus pain and sinus pressure.   Respiratory: Positive for cough.   Neurological: Positive for headaches.   Objective:  BP 108/72   Pulse 96   Temp 98.5 F (36.9 C) (Oral)   Resp 14   Wt 144 lb 3.2 oz (65.4 kg)   LMP 08/30/2003   SpO2 96%   BMI 28.16 kg/m   BP/Weight 08/09/2016 03/02/2016 [id number]  Systolic BP 123XX123 [id number]  Diastolic BP 72 83 82  Wt. (Lbs) 144.2 153.5 156.25  BMI 28.16 29.98 30.52   Physical Exam  Constitutional: She is oriented to person, place, and time. She appears well-developed. No distress.  HENT:  Mouth/Throat: Oropharynx is clear and moist.  Normal TM's bilaterally.  Cardiovascular: Normal  rate and regular rhythm.   Pulmonary/Chest: Effort normal and breath sounds normal.  Neurological: She is alert and oriented to person, place, and time.  Vitals reviewed.  Lab Results  Component Value Date   WBC 8.7 02/07/2016   HGB 12.1 02/07/2016   HCT 36.2 02/07/2016   PLT 445.0 (H) 02/07/2016   GLUCOSE 75 02/07/2016   CHOL 156 02/07/2016   TRIG 212.0 (H) 02/07/2016   HDL 58.30 02/07/2016   LDLDIRECT 74.0 02/07/2016   ALT 19 02/07/2016   AST 16 02/07/2016   NA 139 02/07/2016   K 3.9 02/07/2016   CL 101 02/07/2016   CREATININE 0.86 02/07/2016   BUN 16 02/07/2016   CO2 29 02/07/2016   HGBA1C 7.2 (H) 02/07/2016   MICROALBUR 1.5 02/07/2016   Assessment & Plan:   Problem List Items Addressed This Visit    URI with cough and congestion    Treating with tussionex and doxy.      Hypertension    At goal. Continue Coreg, losartan/HCTZ.      DM type 2 (diabetes mellitus, type 2) (HCC) - Primary    Stable.  A1c today. Continue metformin and glipizide.      Relevant Orders   Hemoglobin A1c     Meds ordered this encounter  Medications  . doxycycline (VIBRA-TABS) 100 MG tablet  Sig: Take 1 tablet (100 mg total) by mouth 2 (two) times daily.    Dispense:  14 tablet    Refill:  0  . chlorpheniramine-HYDROcodone (TUSSIONEX PENNKINETIC ER) 10-8 MG/5ML SUER    Sig: Take 5 mLs by mouth every 12 (twelve) hours as needed.    Dispense:  115 mL    Refill:  0    Follow-up: 3-6 months.   Citrus cest

## 2016-08-09 NOTE — Progress Notes (Signed)
Pre visit review using our clinic review tool, if applicable. No additional management support is needed unless otherwise documented below in the visit note. 

## 2016-08-09 NOTE — Assessment & Plan Note (Signed)
Stable.  A1c today. Continue metformin and glipizide.

## 2016-08-25 ENCOUNTER — Telehealth: Payer: Self-pay

## 2016-08-25 NOTE — Telephone Encounter (Signed)
Message left to see where patient would like to have her mammogram done.

## 2016-08-29 NOTE — Telephone Encounter (Signed)
The patient will have her mammograms done at the [facility] breast care center.

## 2016-09-07 ENCOUNTER — Inpatient Hospital Stay
Admission: RE | Admit: 2016-09-07 | Discharge: 2016-09-07 | Disposition: A | Discharge status: Home / Self Care | Payer: Self-pay | Source: Ambulatory Visit | Attending: *Deleted | Admitting: *Deleted

## 2016-09-07 ENCOUNTER — Other Ambulatory Visit: Payer: Self-pay | Admitting: *Deleted

## 2016-09-07 DIAGNOSIS — Z9289 Personal history of other medical treatment: Secondary | ICD-10-CM

## 2016-09-12 ENCOUNTER — Other Ambulatory Visit: Payer: Self-pay

## 2016-09-12 DIAGNOSIS — C50911 Malignant neoplasm of unspecified site of right female breast: Secondary | ICD-10-CM

## 2016-10-16 ENCOUNTER — Other Ambulatory Visit: Payer: Self-pay | Admitting: Family Medicine

## 2016-10-16 ENCOUNTER — Other Ambulatory Visit: Payer: Self-pay | Admitting: Internal Medicine

## 2016-10-16 DIAGNOSIS — C50911 Malignant neoplasm of unspecified site of right female breast: Secondary | ICD-10-CM

## 2016-10-25 ENCOUNTER — Other Ambulatory Visit: Payer: PPO

## 2016-10-25 ENCOUNTER — Ambulatory Visit: Payer: PPO

## 2016-10-31 ENCOUNTER — Ambulatory Visit: Payer: PPO | Admitting: General Surgery

## 2016-11-10 ENCOUNTER — Other Ambulatory Visit: Payer: Self-pay | Admitting: Family Medicine

## 2016-11-14 ENCOUNTER — Ambulatory Visit
Admission: RE | Admit: 2016-11-14 | Discharge: 2016-11-14 | Disposition: A | Discharge status: Home / Self Care | Payer: PPO | Source: Ambulatory Visit | Attending: General Surgery | Admitting: General Surgery

## 2016-11-14 DIAGNOSIS — C50911 Malignant neoplasm of unspecified site of right female breast: Secondary | ICD-10-CM | POA: Diagnosis not present

## 2016-11-14 DIAGNOSIS — R928 Other abnormal and inconclusive findings on diagnostic imaging of breast: Secondary | ICD-10-CM | POA: Diagnosis not present

## 2016-11-16 ENCOUNTER — Encounter: Payer: Self-pay | Admitting: *Deleted

## 2016-11-22 ENCOUNTER — Encounter: Payer: Self-pay | Admitting: General Surgery

## 2016-11-22 ENCOUNTER — Ambulatory Visit (INDEPENDENT_AMBULATORY_CARE_PROVIDER_SITE_OTHER): Payer: PPO | Admitting: General Surgery

## 2016-11-22 VITALS — BP 122/70 | HR 74 | Resp 12 | Ht 60.0 in | Wt 145.0 lb

## 2016-11-22 DIAGNOSIS — Z170 Estrogen receptor positive status [ER+]: Secondary | ICD-10-CM | POA: Diagnosis not present

## 2016-11-22 DIAGNOSIS — C50911 Malignant neoplasm of unspecified site of right female breast: Secondary | ICD-10-CM | POA: Diagnosis not present

## 2016-11-22 NOTE — Progress Notes (Signed)
Patient ID: Hannah [name], female   DOB: 04-Dec-1950, 66 y.o.   MRN: [id number]  Chief Complaint  Patient presents with  . Follow-up    mammogram    HPI Hannah [name] is a 66 y.o. female who presents for a breast evaluation. The most recent mammogram was done on 10/25/2016.  Patient does perform regular self breast checks and gets regular mammograms done.  Doing well on the Arimidex . Patient states she does get the color guard test done every three years.  Marland KitchenHPI  Past Medical History:  Diagnosis Date  . Allergy   . Cancer [facility]) 2011    DCIS. 9 mm histologic grade 2 invasive mammary carcinoma, ER 90%, PR 5%, HER-2/neu not over expressed. Sentinel nodes negative.  . Chicken pox   . Diabetes mellitus without complication ([place])   . History of lumpectomy 2011   Right breast  . Hyperlipidemia   . Hypertension 2001    Past Surgical History:  Procedure Laterality Date  . BREAST LUMPECTOMY Right 2011  . BREAST SURGERY Right 2011    right breast wide excision, partial breast radiation.  . COLONOSCOPY  Declines   Reports she has annual stool fecal occult blood testing.  [name] DILATION AND CURETTAGE OF UTERUS  1990  . PORT-A-CATH REMOVAL  2012  . right breast wide excision  2011  . WRIST SURGERY  1998   pins placed    Family History  Problem Relation Age of Onset  . Arthritis Mother   . Alzheimer's disease Mother   . Arthritis Father   . Heart disease Father   . Arthritis Maternal Grandmother   . Breast cancer Maternal Grandmother   . Alzheimer's disease Maternal Grandmother   . Stroke Maternal Grandfather     Social History Social History  Substance Use Topics  . Smoking status: Never Smoker  . Smokeless tobacco: Never Used  . Alcohol use No    Allergies  Allergen Reactions  . Azithromycin Swelling    Itching, swelling, and rash  . Codeine Other (See Comments)    Mental changes  . Penicillins Itching and Rash    Current Outpatient Prescriptions  Medication Sig  Dispense Refill  . anastrozole (ARIMIDEX) 1 MG tablet take 1 tablet by mouth once daily 90 tablet 1  . carvedilol (COREG) 12.5 MG tablet take 1 tablet by mouth twice a day 180 tablet 1  . Cholecalciferol (VITAMIN D3) 1000 UNITS CAPS Take 1 capsule by mouth daily.    [name] doxycycline (VIBRA-TABS) 100 MG tablet Take 1 tablet (100 mg total) by mouth 2 (two) times daily. 14 tablet 0  . FLUoxetine (PROZAC) 10 MG capsule take 3 capsules by mouth once daily for anxiety 90 capsule 1  . fluticasone (FLONASE) 50 MCG/ACT nasal spray Place 1 spray into both nostrils 2 (two) times daily.    [name] glipiZIDE (GLUCOTROL XL) 2.5 MG 24 hr tablet take 1 tablet by mouth once daily for diabetes 180 tablet 1  . losartan-hydrochlorothiazide (HYZAAR) 50-12.5 MG tablet take 1 tablet by mouth once daily 90 tablet 0  . metFORMIN (GLUCOPHAGE) 1000 MG tablet take 1 tablet by mouth twice a day for diabetes 180 tablet 1  . Multiple Vitamins-Minerals (WOMENS MULTI VITAMIN & MINERAL PO) Take by mouth.    . simvastatin (ZOCOR) 40 MG tablet take 1 tablet by mouth at bedtime 90 tablet 2  . sodium chloride (OCEAN) 0.65 % SOLN nasal spray Place 1 spray into both nostrils as needed for congestion.    [name]  vitamin E 400 UNIT capsule Take 400 Units by mouth daily.     No current facility-administered medications for this visit.     Review of Systems Review of Systems  Constitutional: Negative.   Respiratory: Negative.   Cardiovascular: Negative.     Blood pressure 122/70, pulse 74, resp. rate 12, height 5' (1.524 m), weight 145 lb (65.8 kg), last menstrual period 08/30/2003.  Physical Exam Physical Exam  Constitutional: She is oriented to person, place, and time. She appears well-developed and well-nourished.  Neck: Neck supple.  Cardiovascular: Normal rate, regular rhythm and normal heart sounds.   Pulmonary/Chest: Effort normal and breath sounds normal. Right breast exhibits no inverted nipple, no mass, no nipple discharge, no skin  change and no tenderness. Left breast exhibits no inverted nipple, no mass, no nipple discharge, no skin change and no tenderness.    Right breast incision is well healed.  Lymphadenopathy:    She has no cervical adenopathy.    She has no axillary adenopathy.  Neurological: She is alert and oriented to person, place, and time.  Skin: Skin is warm and dry.    Data Reviewed Bilateral diagnostic mammograms dated 11/14/2016 were reviewed. Stable postradiation/surgery changes in the right breast. BI-RADS-2. Recommendation for screening mammograms in 2019.  Review of the electronic record does not record any Cologuard results.   Assessment    No evidence of recurrent disease.  Dermal changes consistent with previous radiation.    Plan    In the past, the patient has refused to consider a colonoscopy. I spoke to her today about having the Cologuard test as she is a slightly higher risk of colon cancer based on her past history of breast cancer. She reported that she has been having Cologuard test done on a regular basis.  The patient has been asked to return to the office in one year with a bilateral screening mammogram.   HPI, Physical Exam, Assessment and Plan have been scribed under the direction and in the presence of Hervey Ard, MD.  Gaspar Cola, CMA  I have completed the exam and reviewed the above documentation for accuracy and completeness.  I agree with the above.  Haematologist has been used and any errors in dictation or transcription are unintentional.  Hervey Ard, M.D., F.A.C.S.  Robert Bellow 11/22/2016, 1:20 PM

## 2016-11-22 NOTE — Patient Instructions (Signed)
The patient has been asked to return to the office in one year with a bilateral screening mammogram. 

## 2016-12-04 ENCOUNTER — Other Ambulatory Visit: Payer: Self-pay | Admitting: Family Medicine

## 2016-12-11 DIAGNOSIS — D229 Melanocytic nevi, unspecified: Secondary | ICD-10-CM | POA: Diagnosis not present

## 2016-12-11 DIAGNOSIS — L821 Other seborrheic keratosis: Secondary | ICD-10-CM | POA: Diagnosis not present

## 2016-12-19 DIAGNOSIS — S52123A Displaced fracture of head of unspecified radius, initial encounter for closed fracture: Secondary | ICD-10-CM | POA: Insufficient documentation

## 2016-12-19 DIAGNOSIS — S52125A Nondisplaced fracture of head of left radius, initial encounter for closed fracture: Secondary | ICD-10-CM | POA: Diagnosis not present

## 2016-12-19 HISTORY — DX: Displaced fracture of head of unspecified radius, initial encounter for closed fracture: S52.123A

## 2016-12-25 ENCOUNTER — Other Ambulatory Visit: Payer: Self-pay | Admitting: Family Medicine

## 2016-12-26 NOTE — Telephone Encounter (Signed)
Last office visit 08/09/16 No office visit scheduled, to follow up 3-6 months  Please advise

## 2017-01-01 DIAGNOSIS — S52125A Nondisplaced fracture of head of left radius, initial encounter for closed fracture: Secondary | ICD-10-CM | POA: Diagnosis not present

## 2017-01-15 DIAGNOSIS — S52125A Nondisplaced fracture of head of left radius, initial encounter for closed fracture: Secondary | ICD-10-CM | POA: Diagnosis not present

## 2017-01-16 ENCOUNTER — Other Ambulatory Visit: Payer: Self-pay | Admitting: Family Medicine

## 2017-01-25 DIAGNOSIS — S52125D Nondisplaced fracture of head of left radius, subsequent encounter for closed fracture with routine healing: Secondary | ICD-10-CM | POA: Diagnosis not present

## 2017-02-08 ENCOUNTER — Other Ambulatory Visit: Payer: Self-pay | Admitting: Family Medicine

## 2017-03-02 ENCOUNTER — Ambulatory Visit: Payer: PPO | Admitting: Internal Medicine

## 2017-03-02 ENCOUNTER — Other Ambulatory Visit: Payer: PPO

## 2017-03-14 ENCOUNTER — Inpatient Hospital Stay: Payer: PPO | Attending: Internal Medicine

## 2017-03-14 ENCOUNTER — Inpatient Hospital Stay (HOSPITAL_BASED_OUTPATIENT_CLINIC_OR_DEPARTMENT_OTHER): Payer: PPO | Admitting: Internal Medicine

## 2017-03-14 ENCOUNTER — Telehealth: Payer: Self-pay

## 2017-03-14 VITALS — BP 112/75 | HR 92 | Temp 97.6°F | Resp 18 | Ht 60.0 in | Wt 146.0 lb

## 2017-03-14 DIAGNOSIS — Z170 Estrogen receptor positive status [ER+]: Secondary | ICD-10-CM | POA: Insufficient documentation

## 2017-03-14 DIAGNOSIS — Z9221 Personal history of antineoplastic chemotherapy: Secondary | ICD-10-CM | POA: Insufficient documentation

## 2017-03-14 DIAGNOSIS — Z923 Personal history of irradiation: Secondary | ICD-10-CM | POA: Insufficient documentation

## 2017-03-14 DIAGNOSIS — Z79811 Long term (current) use of aromatase inhibitors: Secondary | ICD-10-CM | POA: Diagnosis not present

## 2017-03-14 DIAGNOSIS — C50811 Malignant neoplasm of overlapping sites of right female breast: Secondary | ICD-10-CM | POA: Insufficient documentation

## 2017-03-14 MED ORDER — ANASTROZOLE 1 MG PO TABS: 1.0000 mg | ORAL_TABLET | Freq: Every day | ORAL | 3 refills | Status: DC

## 2017-03-14 NOTE — Progress Notes (Signed)
[place] OFFICE PROGRESS NOTE  Patient Care Team: Coral Spikes, DO as PCP - General (Family Medicine) Bary Castilla, Forest Gleason, MD (General Surgery) Albertina Parr, MD as Referring Physician [facility] Medicine)  Cancer Staging No matching staging information was found for the patient.   Oncology History   # AUG 201- T1b N0 M0 (clinical, stage I) infiltrating ductal carcinoma of the right breast status post wide local excision and sentinel node study March 14, 2010. Tumor size 9 mm, grade 2, margins negative.  2 sentinel lymph nodes negative.  ER positive (90%), PR borderline (5%) HER-2/neu 2+ on IHC, negative on FISH. Her2/CEP ratio is 1.45. April 11, 2010 - Oncotype DX score is 35 (high risk group) with average rate of distant recurrence of 24%. Got mammosite.  Patient completed adjuvant chemotherapy with Taxotere/Cytoxan, then radiation, then started aromatase inhibitor in February 2012.     Malignant neoplasm of overlapping sites of right breast ([place])    INTERVAL HISTORY:  Hannah [name] 66 y.o.  female pleasant patient above history of Stage I breast cancer ER/PR positive high risk Oncotype- currently on Arimidex is here for follow-up.  In the interim patient had evaluation with Dr. Charissa Bash April 2018 after a mammogram. Patient denies any lumps or bumps. Has chronic arthritis of her hands and feet- which is not any worse..  Denies any chest pain or shortness of breath or cough.  REVIEW OF SYSTEMS:  A complete 10 point review of system is done which is negative except mentioned above/history of present illness.   PAST MEDICAL HISTORY :  Past Medical History:  Diagnosis Date  . Allergy   . Cancer [facility]) 2011    DCIS. 9 mm histologic grade 2 invasive mammary carcinoma, ER 90%, PR 5%, HER-2/neu not over expressed. Sentinel nodes negative.  . Chicken pox   . Diabetes mellitus without complication ([place])   . History of lumpectomy 2011   Right breast  .  Hyperlipidemia   . Hypertension 2001    PAST SURGICAL HISTORY :   Past Surgical History:  Procedure Laterality Date  . BREAST LUMPECTOMY Right 2011  . BREAST SURGERY Right 2011    right breast wide excision, partial breast radiation.  . COLONOSCOPY  Declines   Reports she has annual stool fecal occult blood testing.  [name] DILATION AND CURETTAGE OF UTERUS  1990  . PORT-A-CATH REMOVAL  2012  . right breast wide excision  2011  . WRIST SURGERY  1998   pins placed    FAMILY HISTORY :   Family History  Problem Relation Age of Onset  . Arthritis Mother   . Alzheimer's disease Mother   . Arthritis Father   . Heart disease Father   . Arthritis Maternal Grandmother   . Breast cancer Maternal Grandmother   . Alzheimer's disease Maternal Grandmother   . Stroke Maternal Grandfather     SOCIAL HISTORY:   Social History  Substance Use Topics  . Smoking status: Never Smoker  . Smokeless tobacco: Never Used  . Alcohol use No    ALLERGIES:  is allergic to azithromycin; codeine; and penicillins.  MEDICATIONS:  Current Outpatient Prescriptions  Medication Sig Dispense Refill  . anastrozole (ARIMIDEX) 1 MG tablet Take 1 tablet (1 mg total) by mouth daily. 90 tablet 3  . carvedilol (COREG) 12.5 MG tablet take 1 tablet by mouth twice a day 180 tablet 1  . Cholecalciferol (VITAMIN D3) 1000 UNITS CAPS Take 1 capsule by mouth daily.    [name]  FLUoxetine (PROZAC) 10 MG capsule take 3 capsules by mouth once daily for anxiety 90 capsule 1  . glipiZIDE (GLUCOTROL XL) 2.5 MG 24 hr tablet take 1 tablet by mouth once daily for diabetes 180 tablet 1  . losartan-hydrochlorothiazide (HYZAAR) 50-12.5 MG tablet take 1 tablet by mouth once daily 90 tablet 0  . metFORMIN (GLUCOPHAGE) 1000 MG tablet take 1 tablet by mouth twice a day for diabetes 180 tablet 1  . simvastatin (ZOCOR) 40 MG tablet take 1 tablet by mouth at bedtime 90 tablet 2   No current facility-administered medications for this visit.      PHYSICAL EXAMINATION: ECOG PERFORMANCE STATUS: 0 - Asymptomatic  BP 112/75 (BP Location: Left Arm, Patient Position: Sitting)   Pulse 92   Temp 97.6 F (36.4 C) (Tympanic)   Resp 18   Ht 5' (1.524 m)   Wt 146 lb (66.2 kg)   LMP 08/30/2003   BMI 28.51 kg/m   Filed Weights   03/14/17 1056  Weight: 146 lb (66.2 kg)    GENERAL: Well-nourished well-developed; Alert, no distress and comfortable.   Alone. EYES: no pallor or icterus OROPHARYNX: no thrush or ulceration; good dentition  NECK: supple, no masses felt LYMPH:  no palpable lymphadenopathy in the cervical, axillary or inguinal regions LUNGS: clear to auscultation and  No wheeze or crackles HEART/CVS: regular rate & rhythm and no murmurs; No lower extremity edema ABDOMEN:abdomen soft, non-tender and normal bowel sounds Musculoskeletal:no cyanosis of digits and no clubbing  PSYCH: alert & oriented x 3 with fluent speech NEURO: no focal motor/sensory deficits SKIN:  no rashes or significant lesions  Right and left BREAST exam [in the presence of nurse]- no unusual skin changes [radiation induced telangiectasia noted] or dominant masses felt. Surgical scars noted.    LABORATORY DATA:  I have reviewed the data as listed    Component Value Date/Time   NA 139 02/07/2016 1508   K 3.9 02/07/2016 1508   CL 101 02/07/2016 1508   CO2 29 02/07/2016 1508   GLUCOSE 75 02/07/2016 1508   BUN 16 02/07/2016 1508   CREATININE 0.86 02/07/2016 1508   CREATININE 0.95 08/12/2013 1336   CALCIUM 9.9 02/07/2016 1508   CALCIUM 9.9 02/10/2014 1458   PROT 6.9 02/07/2016 1508   PROT 7.4 02/10/2014 1458   ALBUMIN 4.2 02/07/2016 1508   ALBUMIN 3.8 02/10/2014 1458   AST 16 02/07/2016 1508   AST 17 02/10/2014 1458   ALT 19 02/07/2016 1508   ALT 34 02/10/2014 1458   ALKPHOS 51 02/07/2016 1508   ALKPHOS 69 02/10/2014 1458   BILITOT 0.4 02/07/2016 1508   BILITOT 0.5 02/10/2014 1458   GFRNONAA >60 02/12/2015 1426   GFRNONAA >60  08/12/2013 1336   GFRAA >60 02/12/2015 1426   GFRAA >60 08/12/2013 1336    No results found for: SPEP, UPEP  Lab Results  Component Value Date   WBC 8.7 02/07/2016   NEUTROABS 4.5 02/12/2015   HGB 12.1 02/07/2016   HCT 36.2 02/07/2016   MCV 89.6 02/07/2016   PLT 445.0 (H) 02/07/2016      Chemistry      Component Value Date/Time   NA 139 02/07/2016 1508   K 3.9 02/07/2016 1508   CL 101 02/07/2016 1508   CO2 29 02/07/2016 1508   BUN 16 02/07/2016 1508   CREATININE 0.86 02/07/2016 1508   CREATININE 0.95 08/12/2013 1336      Component Value Date/Time   CALCIUM 9.9 02/07/2016 1508  CALCIUM 9.9 02/10/2014 1458   ALKPHOS 51 02/07/2016 1508   ALKPHOS 69 02/10/2014 1458   AST 16 02/07/2016 1508   AST 17 02/10/2014 1458   ALT 19 02/07/2016 1508   ALT 34 02/10/2014 1458   BILITOT 0.4 02/07/2016 1508   BILITOT 0.5 02/10/2014 1458       RADIOGRAPHIC STUDIES: I have personally reviewed the radiological images as listed and agreed with the findings in the report. No results found.   ASSESSMENT & PLAN:  Malignant neoplasm of overlapping sites of right breast ([place]) # STAGE I Breast ca ER/PR-Pos; her 2 Neu NEG [high risk oncotype]. Clinically no evidence of recurrence. Patient Arimidex/extended duration.   # arthitis sec to AI- not on chondriotin sulfate/ glucosamine. Improved.   # BMD-Jan 2015-normal. BMD with mamogram in April 2019. Continue Ca+ vit D  # follow up in 12 months/ labs.    Orders Placed This Encounter  Procedures  . DG Bone Density    Standing Status:   Future    Standing Expiration Date:   03/14/2018    Order Specific Question:   Reason for Exam (SYMPTOM  OR DIAGNOSIS REQUIRED)    Answer:   aromatase inhibitor use; h/o breast cancer    Order Specific Question:   Preferred imaging location?    Answer:   [facility]  . MM SCREENING BREAST TOMO BILATERAL    Standing Status:   Future    Standing Expiration Date:   05/14/2018    Order Specific  Question:   Reason for Exam (SYMPTOM  OR DIAGNOSIS REQUIRED)    Answer:   history of breast cancer    Order Specific Question:   Preferred imaging location?    Answer:   [facility]  . CBC with Differential/Platelet    Standing Status:   Future    Standing Expiration Date:   03/14/2018  . Comprehensive metabolic panel    Standing Status:   Future    Standing Expiration Date:   03/14/2018  . Cancer antigen 27.29    Standing Status:   Future    Standing Expiration Date:   03/14/2018   All questions were answered. The patient knows to call the clinic with any problems, questions or concerns.      Cammie Sickle, MD 03/14/2017 11:23 AM

## 2017-03-14 NOTE — Assessment & Plan Note (Addendum)
#   STAGE I Breast ca ER/PR-Pos; her 2 Neu NEG [high risk oncotype]. Clinically no evidence of recurrence. Patient Arimidex/extended duration.   # arthitis sec to AI- not on chondriotin sulfate/ glucosamine. Improved.   # BMD-Jan 2015-normal. BMD with mamogram in April 2019. Continue Ca+ vit D  # follow up in 12 months/ labs.

## 2017-03-14 NOTE — Telephone Encounter (Signed)
Patient request an order for cologuard ok to do order ?

## 2017-03-16 NOTE — Telephone Encounter (Signed)
Faxed order for Cologuard , patient advised

## 2017-03-16 NOTE — Telephone Encounter (Signed)
Yes

## 2017-03-25 DIAGNOSIS — Z1212 Encounter for screening for malignant neoplasm of rectum: Secondary | ICD-10-CM | POA: Diagnosis not present

## 2017-03-25 DIAGNOSIS — Z1211 Encounter for screening for malignant neoplasm of colon: Secondary | ICD-10-CM | POA: Diagnosis not present

## 2017-03-27 ENCOUNTER — Other Ambulatory Visit: Payer: Self-pay | Admitting: Family Medicine

## 2017-04-05 LAB — COLOGUARD: Cologuard: NEGATIVE

## 2017-04-13 ENCOUNTER — Other Ambulatory Visit: Payer: Self-pay | Admitting: Family Medicine

## 2017-04-13 DIAGNOSIS — C50911 Malignant neoplasm of unspecified site of right female breast: Secondary | ICD-10-CM

## 2017-05-10 ENCOUNTER — Other Ambulatory Visit: Payer: Self-pay | Admitting: Family Medicine

## 2017-06-03 ENCOUNTER — Other Ambulatory Visit: Payer: Self-pay | Admitting: Family Medicine

## 2017-06-04 NOTE — Telephone Encounter (Signed)
1 week of refills given.

## 2017-06-04 NOTE — Telephone Encounter (Signed)
Patient is exstablishing with [facility] on 06/11/17, but needs refill on metformin til that date ok to fill last labs 08/09/16.

## 2017-06-05 ENCOUNTER — Other Ambulatory Visit: Payer: Self-pay | Admitting: Family Medicine

## 2017-06-05 NOTE — Telephone Encounter (Signed)
I already refilled this yesterday.  Thanks.

## 2017-06-05 NOTE — Telephone Encounter (Signed)
Ok to fill with 14 tablets til patient establishes with [facility] on 06/11/17/ last A1c 08/09/16

## 2017-06-11 ENCOUNTER — Encounter: Payer: Self-pay | Admitting: Family Medicine

## 2017-06-11 ENCOUNTER — Ambulatory Visit (INDEPENDENT_AMBULATORY_CARE_PROVIDER_SITE_OTHER): Payer: PPO | Admitting: Family Medicine

## 2017-06-11 VITALS — BP 122/72 | HR 87 | Temp 98.8°F | Resp 16 | Ht 60.0 in | Wt 149.0 lb

## 2017-06-11 DIAGNOSIS — E785 Hyperlipidemia, unspecified: Secondary | ICD-10-CM

## 2017-06-11 DIAGNOSIS — E119 Type 2 diabetes mellitus without complications: Secondary | ICD-10-CM

## 2017-06-11 DIAGNOSIS — Z23 Encounter for immunization: Secondary | ICD-10-CM

## 2017-06-11 DIAGNOSIS — I10 Essential (primary) hypertension: Secondary | ICD-10-CM

## 2017-06-11 MED ORDER — METFORMIN HCL 1000 MG PO TABS: ORAL_TABLET | ORAL | 1 refills | Status: DC

## 2017-06-11 NOTE — Progress Notes (Signed)
Patient: Hannah [name] Female    DOB: 07-25-1951   66 y.o.   MRN: [id number] Visit Date: 06/11/2017  Today's Provider: Lelon Huh, MD   Chief Complaint  Patient presents with  . Establish Care  . Hypertension  . Diabetes   Subjective:    HPI New Patient establishing care:  Patient comes in to establish care. Her previous PCP was Dr. Lacinda Axon at [facility] care at [place]. Her PCP was moved and she would like to establish care here as a new patient. She feels well today with no complaints.    Hypertension:  BP Readings from Last 3 Encounters:  03/14/17 112/75  11/22/16 122/70  08/09/16 108/72    She was last seen for hypertension 10 months ago at [facility].  BP at that visit was 108/72. Management since that visit includes no changes. She reports good compliance with treatment. She is not having side effects.  She is not exercising. She is not adherent to low salt diet.   Outside blood pressures are not being checked. She is experiencing none.  Patient denies chest pain, chest pressure/discomfort, claudication, dyspnea, exertional chest pressure/discomfort, fatigue, irregular heart beat, lower extremity edema, near-syncope, orthopnea, palpitations, paroxysmal nocturnal dyspnea, syncope and tachypnea.   Cardiovascular risk factors include advanced age (older than 57 for men, 45 for women), diabetes mellitus and hypertension.  Use of agents associated with hypertension: none.     Weight trend: fluctuating a bit Wt Readings from Last 3 Encounters:  03/14/17 146 lb (66.2 kg)  11/22/16 145 lb (65.8 kg)  08/09/16 144 lb 3.2 oz (65.4 kg)    Current diet: in general, an "unhealthy" diet  ------------------------------------------------------------------------  Diabetes Mellitus Type II:   Lab Results  Component Value Date   HGBA1C 7.1 (H) 08/09/2016   HGBA1C 7.2 (H) 02/07/2016    Last seen for diabetes 10 months ago.  Management  since then includes no changes. She reports good compliance with treatment. She is not having side effects.  Current symptoms include paresthesia of the feet and have been stable. Home blood sugar records: blood sugars are not being checked  Episodes of hypoglycemia? occasionally   Current Insulin Regimen: none Most Recent Eye Exam: 1 year ago Weight trend: fluctuating a bit Prior visit with dietician: no Current diet: in general, an "unhealthy" diet Current exercise: none  Pertinent Labs:    Component Value Date/Time   CHOL 156 02/07/2016 1508   TRIG 212.0 (H) 02/07/2016 1508   HDL 58.30 02/07/2016 1508   CREATININE 0.86 02/07/2016 1508   CREATININE 0.95 08/12/2013 1336    Wt Readings from Last 3 Encounters:  03/14/17 146 lb (66.2 kg)  11/22/16 145 lb (65.8 kg)  08/09/16 144 lb 3.2 oz (65.4 kg)    ------------------------------------------------------------------------     Allergies  Allergen Reactions  . Azithromycin Swelling    Itching, swelling, and rash  . Codeine Other (See Comments)    Mental changes  . Penicillins Itching and Rash     Current Outpatient Medications:  .  anastrozole (ARIMIDEX) 1 MG tablet, Take 1 tablet (1 mg total) by mouth daily., Disp: 90 tablet, Rfl: 3 .  carvedilol (COREG) 12.5 MG tablet, take 1 tablet by mouth twice a day, Disp: 180 tablet, Rfl: 1 .  Cholecalciferol (VITAMIN D3) 1000 UNITS CAPS, Take 1 capsule by mouth daily., Disp: , Rfl:  .  FLUoxetine (PROZAC) 10 MG capsule, TAKE 3 CAPSULES BY MOUTH ONCE A  DAY FOR ANXIETY, Disp: 90 capsule, Rfl: 1 .  glipiZIDE (GLUCOTROL XL) 2.5 MG 24 hr tablet, take 1 tablet by mouth once daily for diabetes, Disp: 180 tablet, Rfl: 1 .  losartan-hydrochlorothiazide (HYZAAR) 50-12.5 MG tablet, take 1 tablet by mouth once daily, Disp: 90 tablet, Rfl: 0 .  metFORMIN (GLUCOPHAGE) 1000 MG tablet, take 1 tablet by mouth twice a day for diabetes, Disp: 14 tablet, Rfl: 0 .  simvastatin (ZOCOR) 40 MG  tablet, take 1 tablet by mouth at bedtime, Disp: 90 tablet, Rfl: 2  Review of Systems  Constitutional: Negative for chills, fatigue and fever.  HENT: Positive for sinus pressure and trouble swallowing. Negative for congestion, ear pain, rhinorrhea, sneezing and sore throat.   Eyes: Negative.  Negative for pain and redness.  Respiratory: Negative for cough, shortness of breath and wheezing.   Cardiovascular: Negative for chest pain and leg swelling.  Gastrointestinal: Negative for abdominal pain, blood in stool, constipation, diarrhea and nausea.  Endocrine: Negative for polydipsia and polyphagia.  Genitourinary: Negative.  Negative for dysuria, flank pain, hematuria, pelvic pain, vaginal bleeding and vaginal discharge.  Musculoskeletal: Negative for arthralgias, back pain, gait problem and joint swelling.  Skin: Negative for rash.  Neurological: Negative.  Negative for dizziness, tremors, seizures, weakness, light-headedness, numbness and headaches.  Hematological: Negative for adenopathy.  Psychiatric/Behavioral: Negative.  Negative for behavioral problems, confusion and dysphoric mood. The patient is not nervous/anxious and is not hyperactive.     Social History   Tobacco Use  . Smoking status: Never Smoker  . Smokeless tobacco: Never Used  Substance Use Topics  . Alcohol use: No   Objective:   BP 122/72   Pulse 87   Temp 98.8 F (37.1 C)   Resp 16   Ht 5' (1.524 m)   Wt 149 lb (67.6 kg)   LMP 08/30/2003   SpO2 95% Comment: room air  BMI 29.10 kg/m    Physical Exam   General Appearance:    Alert, cooperative, no distress  Eyes:    PERRL, conjunctiva/corneas clear, EOM's intact       Lungs:     Clear to auscultation bilaterally, respirations unlabored  Heart:    Regular rate and rhythm  Neurologic:   Awake, alert, oriented x 3. No apparent focal neurological           defect.           Assessment & Plan:     1. Essential hypertension Well controlled.  Continue  current medications.    2. Type 2 diabetes mellitus without complication, without long-term current use of insulin (HCC) Due for a1c.  - Hemoglobin A1c - metFORMIN (GLUCOPHAGE) 1000 MG tablet; take 1 tablet by mouth twice a day for diabetes  Dispense: 60 tablet; Refill: 1  3. Hyperlipidemia, unspecified hyperlipidemia type She is tolerating simvastatin well with no adverse effects.   - Lipid panel - COMPLETE METABOLIC PANEL WITH GFR  4. Need for pneumococcal vaccination  - Pneumococcal polysaccharide vaccine 23-valent greater than or equal to 2yo subcutaneous/IM  5. Need for influenza vaccination  - Flu vaccine HIGH DOSE PF (Fluzone High dose)   The entirety of the information documented in the History of Present Illness, Review of Systems and Physical Exam were personally obtained by me. Portions of this information were initially documented by Meyer Cory, CMA and reviewed by me for thoroughness and accuracy.        Lelon Huh, MD  [facility]

## 2017-06-13 DIAGNOSIS — E119 Type 2 diabetes mellitus without complications: Secondary | ICD-10-CM | POA: Diagnosis not present

## 2017-06-13 DIAGNOSIS — E785 Hyperlipidemia, unspecified: Secondary | ICD-10-CM | POA: Diagnosis not present

## 2017-06-14 LAB — COMPLETE METABOLIC PANEL WITH GFR
AG RATIO: 1.7 (calc) (ref 1.0–2.5)
ALT: 27 U/L (ref 6–29)
AST: 22 U/L (ref 10–35)
Albumin: 4 g/dL (ref 3.6–5.1)
Alkaline phosphatase (APISO): 70 U/L (ref 33–130)
BUN: 13 mg/dL (ref 7–25)
CALCIUM: 8.9 mg/dL (ref 8.6–10.4)
CHLORIDE: 100 mmol/L (ref 98–110)
CO2: 27 mmol/L (ref 20–32)
Creat: 0.69 mg/dL (ref 0.50–0.99)
GFR, EST NON AFRICAN AMERICAN: 91 mL/min/{1.73_m2} (ref >=60)
GFR, Est African American: 105 mL/min/{1.73_m2} (ref >=60)
Globulin: 2.4 g/dL (calc) (ref 1.9–3.7)
Glucose, Bld: 186 mg/dL — ABNORMAL HIGH (ref 65–99)
POTASSIUM: 3.8 mmol/L (ref 3.5–5.3)
Sodium: 137 mmol/L (ref 135–146)
Total Bilirubin: 0.8 mg/dL (ref 0.2–1.2)
Total Protein: 6.4 g/dL (ref 6.1–8.1)

## 2017-06-14 LAB — LIPID PANEL
CHOL/HDL RATIO: 1.8 (calc) (ref <5.0)
Cholesterol: 146 mg/dL (ref <200)
HDL: 83 mg/dL (ref >50)
LDL Cholesterol (Calc): 45 mg/dL (calc)
NON-HDL CHOLESTEROL (CALC): 63 mg/dL (ref <130)
Triglycerides: 99 mg/dL (ref <150)

## 2017-06-14 LAB — HEMOGLOBIN A1C
HEMOGLOBIN A1C: 7.2 %{Hb} — AB (ref <5.7)
Mean Plasma Glucose: 160 (calc)
eAG (mmol/L): 8.9 (calc)

## 2017-06-20 DIAGNOSIS — E119 Type 2 diabetes mellitus without complications: Secondary | ICD-10-CM | POA: Diagnosis not present

## 2017-06-20 LAB — HM DIABETES EYE EXAM

## 2017-07-04 ENCOUNTER — Other Ambulatory Visit: Payer: Self-pay | Admitting: Family Medicine

## 2017-07-06 ENCOUNTER — Other Ambulatory Visit: Payer: Self-pay | Admitting: Family Medicine

## 2017-07-06 MED ORDER — GLIPIZIDE ER 2.5 MG PO TB24: ORAL_TABLET | ORAL | 3 refills | Status: DC

## 2017-07-06 NOTE — Telephone Encounter (Signed)
Pt contacted office for refill request on the following medications:  glipiZIDE (GLUCOTROL XL) 2.5 MG 24 hr tablet  [facility] [address]  CB#[phone]/MW  Pt is completely out of medication/MW

## 2017-08-10 ENCOUNTER — Other Ambulatory Visit: Payer: Self-pay | Admitting: Family Medicine

## 2017-08-13 ENCOUNTER — Other Ambulatory Visit: Payer: Self-pay | Admitting: Family Medicine

## 2017-09-12 ENCOUNTER — Other Ambulatory Visit: Payer: Self-pay | Admitting: Family Medicine

## 2017-09-12 DIAGNOSIS — E119 Type 2 diabetes mellitus without complications: Secondary | ICD-10-CM

## 2017-09-12 MED ORDER — METFORMIN HCL 1000 MG PO TABS: ORAL_TABLET | ORAL | 11 refills | Status: DC

## 2017-09-12 NOTE — Telephone Encounter (Signed)
Please review. Thanks!  

## 2017-09-12 NOTE — Telephone Encounter (Signed)
Pt contacted office for refill request on the following medications:  metFORMIN (GLUCOPHAGE) 1000 MG tablet   [facility] [place]  Last Rx: 06/11/17 LOV: 06/11/17  Pt stated she is completely out of the medication and since [facility] is going out of business she is switching to [place]. Please advise. Thanks TNP

## 2017-10-04 ENCOUNTER — Encounter: Payer: Self-pay | Admitting: General Surgery

## 2017-10-15 ENCOUNTER — Other Ambulatory Visit: Payer: Self-pay | Admitting: *Deleted

## 2017-10-15 ENCOUNTER — Ambulatory Visit (INDEPENDENT_AMBULATORY_CARE_PROVIDER_SITE_OTHER): Payer: PPO

## 2017-10-15 VITALS — BP 124/62 | HR 84 | Temp 98.8°F | Ht 60.0 in

## 2017-10-15 DIAGNOSIS — Z0000 Encounter for general adult medical examination without abnormal findings: Secondary | ICD-10-CM | POA: Diagnosis not present

## 2017-10-15 MED ORDER — SIMVASTATIN 40 MG PO TABS: 40.0000 mg | ORAL_TABLET | Freq: Every day | ORAL | 3 refills | Status: DC

## 2017-10-15 NOTE — Progress Notes (Signed)
Subjective:   Hannah [name] is a 67 y.o. female who presents for an Initial Medicare Annual Wellness Visit.  Review of Systems    N/A  Cardiac Risk Factors include: advanced age (>37mn, >>55women);diabetes mellitus;dyslipidemia;hypertension     Objective:    Today's Vitals   10/15/17 1451 10/15/17 1456  BP: 124/62   Pulse: 84   Temp: 98.8 F (37.1 C)   TempSrc: Oral   Height: 5' (1.524 m)   PainSc: 0-No pain 0-No pain   Body mass index is 29.1 kg/m.  Advanced Directives 10/15/2017 03/14/2017 03/02/2016 03/02/2016 02/12/2015  Does Patient Have a Medical Advance Directive? Yes No No No No  Type [place] - - - -  [place] of [place]? No - copy requested - - - -  Would patient like information on creating a medical advance directive? - No - Patient declined - - No - patient declined information    Current Medications (verified) Outpatient Encounter Medications as of 10/15/2017  Medication Sig  . anastrozole (ARIMIDEX) 1 MG tablet Take 1 tablet (1 mg total) by mouth daily.  . carvedilol (COREG) 12.5 MG tablet take 1 tablet by mouth twice a day  . Cholecalciferol (VITAMIN D3) 1000 UNITS CAPS Take 1 capsule by mouth daily.  .Marland KitchenFLUoxetine (PROZAC) 10 MG capsule TAKE 3 CAPSULES BY MOUTH ONCE A DAY FOR ANXIETY  . fluticasone (FLONASE) 50 MCG/ACT nasal spray fluticasone propionate as needed  . glipiZIDE (GLUCOTROL XL) 2.5 MG 24 hr tablet take 1 tablet by mouth once daily for diabetes  . losartan-hydrochlorothiazide (HYZAAR) 50-12.5 MG tablet take 1 tablet by mouth once daily  . metFORMIN (GLUCOPHAGE) 1000 MG tablet take 1 tablet by mouth twice a day for diabetes  . simvastatin (ZOCOR) 40 MG tablet take 1 tablet by mouth at bedtime   No facility-administered encounter medications on file as of 10/15/2017.     Allergies (verified) Azithromycin; Codeine; and Penicillins   History: Past Medical History:    Diagnosis Date  . Allergy   . Arthritis   . Cancer ([facility] 2011    DCIS. 9 mm histologic grade 2 invasive mammary carcinoma, ER 90%, PR 5%, HER-2/neu not over expressed. Sentinel nodes negative.  . Chicken pox   . Diabetes mellitus without complication ([place]   . Fractured elbow   . History of lumpectomy 2011   Right breast  . Hyperlipidemia   . Hypertension 2001   Past Surgical History:  Procedure Laterality Date  . BREAST LUMPECTOMY Right 2011  . BREAST SURGERY Right 2011    right breast wide excision, partial breast radiation.  . COLONOSCOPY  Declines   Reports she has annual stool fecal occult blood testing.  .Marland KitchenDILATION AND CURETTAGE OF UTERUS  1990  . PORT-A-CATH REMOVAL  2012  . right breast wide excision  2011  . WRIST SURGERY  1998   pins placed   Family History  Problem Relation Age of Onset  . Arthritis Mother   . Alzheimer's disease Mother   . Arthritis Father   . Heart disease Father   . Arthritis Maternal Grandmother   . Breast cancer Maternal Grandmother   . Alzheimer's disease Maternal Grandmother   . Stroke Maternal Grandfather    Social History   Socioeconomic History  . Marital status: Widowed    Spouse name: None  . Number of children: 1  . Years of education: None  . Highest education  level: Bachelor's degree (e.g., BA, AB, BS)  Social Needs  . Financial resource strain: Not hard at all  . Food insecurity - worry: Never true  . Food insecurity - inability: Never true  . Transportation needs - medical: No  . Transportation needs - non-medical: No  Occupational History  . Occupation: retired  Tobacco Use  . Smoking status: Never Smoker  . Smokeless tobacco: Never Used  Substance and Sexual Activity  . Alcohol use: No  . Drug use: No  . Sexual activity: None  Other Topics Concern  . None  Social History Narrative  . None    Tobacco Counseling Counseling given: Not Answered   Clinical Intake:  Pre-visit preparation completed:  Yes  Pain : No/denies pain Pain Score: 0-No pain     Nutritional Status: BMI 25 -29 Overweight Nutritional Risks: None Diabetes: Yes(type 2) CBG done?: No Did pt. bring in CBG monitor from home?: No  How often do you need to have someone help you when you read instructions, pamphlets, or other written materials from your doctor or pharmacy?: 1 - Never  Interpreter Needed?: No  Information entered by :: [facility], LPN   Activities of Daily Living In your present state of health, do you have any difficulty performing the following activities: 10/15/2017  Hearing? N  Vision? N  Difficulty concentrating or making decisions? N  Walking or climbing stairs? N  Dressing or bathing? N  Doing errands, shopping? N  Preparing Food and eating ? N  Using the Toilet? N  In the past six months, have you accidently leaked urine? N  Do you have problems with loss of bowel control? Y  Comment Occasionally, wears protection.   Managing your Medications? N  Managing your Finances? N  Housekeeping or managing your Housekeeping? N  Some recent data might be hidden     Immunizations and Health Maintenance Immunization History  Administered Date(s) Administered  . Influenza, High Dose Seasonal PF 06/11/2017  . Pneumococcal Conjugate-13 02/07/2016  . Pneumococcal Polysaccharide-23 06/11/2017  . Tdap 12/04/2011  . Zoster 09/11/2013   Health Maintenance Due  Topic Date Due  . Hepatitis C Screening  1950/09/20  . FOOT EXAM  09/26/1960    Patient Care Team: Birdie Sons, MD as PCP - General (Family Medicine) Bary Castilla, Forest Gleason, MD (General Surgery) Cammie Sickle, MD as Consulting Physician (Internal Medicine)  Indicate any recent Medical Services you may have received from other than [facility] providers in the past year (date may be approximate).     Assessment:   This is a routine wellness examination for Hannah [name].  Hearing/Vision screen Vision Screening Comments: Pt goes to  [facility] for vision checks yearly.   Dietary issues and exercise activities discussed: Current Exercise Habits: The patient does not participate in regular exercise at present, Exercise limited by: Other - see comments(busy as a caregiver)  Goals    . Exercise 3x per week (30 min per time)     Recommend to start exercising 3 days a week for 30 minutes. Pt to start at [facility] doing yoga.       Depression Screen PHQ 2/9 Scores 10/15/2017 06/11/2017  PHQ - 2 Score 2 1  PHQ- 9 Score 4 5    Fall Risk Fall Risk  10/15/2017 06/11/2017  Falls in the past year? Yes Yes  Number falls in past yr: 1 1  Comment tripped -  Injury with Fall? Yes Yes  Comment fractured elbow -  Follow up Falls prevention discussed Falls prevention discussed    Is the patient's home free of loose throw rugs in walkways, pet beds, electrical cords, etc?   yes      Grab bars in the bathroom? yes      Handrails on the stairs?   yes      Adequate lighting?   yes  Timed Get Up and Go Performed N/A  Cognitive Function:     6CIT Screen 10/15/2017  What Year? 4 points  What month? 0 points  What time? 0 points  Count back from 20 0 points  Months in reverse 0 points  Repeat phrase 2 points  Total Score 6    Screening Tests Health Maintenance  Topic Date Due  . Hepatitis C Screening  1951-03-01  . FOOT EXAM  09/26/1960  . HEMOGLOBIN A1C  12/11/2017  . OPHTHALMOLOGY EXAM  06/20/2018  . MAMMOGRAM  11/15/2018  . Fecal DNA (Cologuard)  04/05/2020  . TETANUS/TDAP  12/03/2021  . INFLUENZA VACCINE  Completed  . DEXA SCAN  Completed  . PNA vac Low Risk Adult  Completed    Qualifies for Shingles Vaccine? Due for Shingles vaccine. Declined my offer to administer today. Education has been provided regarding the importance of this vaccine. Pt has been advised to call her insurance company to determine her out of pocket expense. Advised she may also receive this vaccine at her local pharmacy or Health  Dept. Verbalized acceptance and understanding.  Cancer Screenings: Lung: Low Dose CT Chest recommended if Age 51-80 years, 30 pack-year currently smoking OR have quit w/in 15years. Patient does not qualify. Breast: Up to date on Mammogram? Yes   Up to date of Bone Density/Dexa? Yes Colorectal: Up to date  Additional Screenings:  Hepatitis C Screening: Pt declines today.     Plan:  I have personally reviewed and addressed the Medicare Annual Wellness questionnaire and have noted the following in the patient's chart:  A. Medical and social history B. Use of alcohol, tobacco or illicit drugs  C. Current medications and supplements D. Functional ability and status E.  Nutritional status F.  Physical activity G. Advance directives H. List of other physicians I.  Hospitalizations, surgeries, and ER visits in previous 12 months J.  [place] such as hearing and vision if needed, cognitive and depression L. Referrals and appointments - none  In addition, I have reviewed and discussed with patient certain preventive protocols, quality metrics, and best practice recommendations. A written personalized care plan for preventive services as well as general preventive health recommendations were provided to patient.  See attached scanned questionnaire for additional information.   Signed,  Fabio Neighbors, LPN Nurse Health Advisor   Nurse Recommendations: Pt needs a diabetic foot exam at next [facility]. Pt declined the Hepatitis C lab today. Pt would like this done with other blood work at next [place].

## 2017-10-15 NOTE — Telephone Encounter (Signed)
Patient came into office and states she needs a refill of her Simvastatin. [facility] [address] . Please advise. Thank you

## 2017-10-15 NOTE — Patient Instructions (Addendum)
Hannah [name] , Thank you for taking time to come for your Medicare Wellness Visit. I appreciate your ongoing commitment to your health goals. Please review the following plan we discussed and let me know if I can assist you in the future.   Screening recommendations/referrals: Colonoscopy: Up to date Mammogram: Up to date Bone Density: Up to date Recommended yearly ophthalmology/optometry visit for glaucoma screening and checkup Recommended yearly dental visit for hygiene and checkup  Vaccinations: Influenza vaccine: Up to date Pneumococcal vaccine: Up to date Tdap vaccine: Up to date Shingles vaccine: Pt declines today.     Advanced directives: Please bring a copy of your POA (Power of Attorney) and/or Living Will to your next appointment.   Conditions/risks identified: Recommend to start exercising 3 days a week for 30 minutes.   Next appointment: 12/10/17 with Dr Caryn Section.    Preventive Care 41 Years and Older, Female Preventive care refers to lifestyle choices and visits with your health care provider that can promote health and wellness. What does preventive care include?  A yearly physical exam. This is also called an annual well check.  Dental exams once or twice a year.  Routine eye exams. Ask your health care provider how often you should have your eyes checked.  Personal lifestyle choices, including:  Daily care of your teeth and gums.  Regular physical activity.  Eating a healthy diet.  Avoiding tobacco and drug use.  Limiting alcohol use.  Practicing safe sex.  Taking low-dose aspirin every day.  Taking vitamin and mineral supplements as recommended by your health care provider. What happens during an annual well check? The services and screenings done by your health care provider during your annual well check will depend on your age, overall health, lifestyle risk factors, and family history of disease. Counseling  Your health care provider may ask you  questions about your:  Alcohol use.  Tobacco use.  Drug use.  Emotional well-being.  Home and relationship well-being.  Sexual activity.  Eating habits.  History of falls.  Memory and ability to understand (cognition).  Work and work Statistician.  Reproductive health. Screening  You may have the following tests or measurements:  Height, weight, and BMI.  Blood pressure.  Lipid and cholesterol levels. These may be checked every 5 years, or more frequently if you are over 24 years old.  Skin check.  Lung cancer screening. You may have this screening every year starting at age 7 if you have a 30-pack-year history of smoking and currently smoke or have quit within the past 15 years.  Fecal occult blood test (FOBT) of the stool. You may have this test every year starting at age 67.  Flexible sigmoidoscopy or colonoscopy. You may have a sigmoidoscopy every 5 years or a colonoscopy every 10 years starting at age 67.  Hepatitis C blood test.  Hepatitis B blood test.  Sexually transmitted disease (STD) testing.  Diabetes screening. This is done by checking your blood sugar (glucose) after you have not eaten for a while (fasting). You may have this done every 1-3 years.  Bone density scan. This is done to screen for osteoporosis. You may have this done starting at age 67.  Mammogram. This may be done every 1-2 years. Talk to your health care provider about how often you should have regular mammograms. Talk with your health care provider about your test results, treatment options, and if necessary, the need for more tests. Vaccines  Your health care provider may recommend  certain vaccines, such as:  Influenza vaccine. This is recommended every year.  Tetanus, diphtheria, and acellular pertussis (Tdap, Td) vaccine. You may need a Td booster every 10 years.  Zoster vaccine. You may need this after age 67.  Pneumococcal 13-valent conjugate (PCV13) vaccine. One dose is  recommended after age 109.  Pneumococcal polysaccharide (PPSV23) vaccine. One dose is recommended after age 67. Talk to your health care provider about which screenings and vaccines you need and how often you need them. This information is not intended to replace advice given to you by your health care provider. Make sure you discuss any questions you have with your health care provider. Document Released: 08/13/2015 Document Revised: 04/05/2016 Document Reviewed: 05/18/2015 Elsevier Interactive Patient Education  2017 [place] Prevention in the Home Falls can cause injuries. They can happen to people of all ages. There are many things you can do to make your home safe and to help prevent falls. What can I do on the outside of my home?  Regularly fix the edges of walkways and driveways and fix any cracks.  Remove anything that might make you trip as you walk through a door, such as a raised step or threshold.  Trim any bushes or trees on the path to your home.  Use bright outdoor lighting.  Clear any walking paths of anything that might make someone trip, such as rocks or tools.  Regularly check to see if handrails are loose or broken. Make sure that both sides of any steps have handrails.  Any raised decks and porches should have guardrails on the edges.  Have any leaves, snow, or ice cleared regularly.  Use sand or salt on walking paths during winter.  Clean up any spills in your garage right away. This includes oil or grease spills. What can I do in the bathroom?  Use night lights.  Install grab bars by the toilet and in the tub and shower. Do not use towel bars as grab bars.  Use non-skid mats or decals in the tub or shower.  If you need to sit down in the shower, use a plastic, non-slip stool.  Keep the floor dry. Clean up any water that spills on the floor as soon as it happens.  Remove soap buildup in the tub or shower regularly.  Attach bath mats  securely with double-sided non-slip rug tape.  Do not have throw rugs and other things on the floor that can make you trip. What can I do in the bedroom?  Use night lights.  Make sure that you have a light by your bed that is easy to reach.  Do not use any sheets or blankets that are too big for your bed. They should not hang down onto the floor.  Have a firm chair that has side arms. You can use this for support while you get dressed.  Do not have throw rugs and other things on the floor that can make you trip. What can I do in the kitchen?  Clean up any spills right away.  Avoid walking on wet floors.  Keep items that you use a lot in easy-to-reach places.  If you need to reach something above you, use a strong step stool that has a grab bar.  Keep electrical cords out of the way.  Do not use floor polish or wax that makes floors slippery. If you must use wax, use non-skid floor wax.  Do not have throw rugs and other  things on the floor that can make you trip. What can I do with my stairs?  Do not leave any items on the stairs.  Make sure that there are handrails on both sides of the stairs and use them. Fix handrails that are broken or loose. Make sure that handrails are as long as the stairways.  Check any carpeting to make sure that it is firmly attached to the stairs. Fix any carpet that is loose or worn.  Avoid having throw rugs at the top or bottom of the stairs. If you do have throw rugs, attach them to the floor with carpet tape.  Make sure that you have a light switch at the top of the stairs and the bottom of the stairs. If you do not have them, ask someone to add them for you. What else can I do to help prevent falls?  Wear shoes that:  Do not have high heels.  Have rubber bottoms.  Are comfortable and fit you well.  Are closed at the toe. Do not wear sandals.  If you use a stepladder:  Make sure that it is fully opened. Do not climb a closed  stepladder.  Make sure that both sides of the stepladder are locked into place.  Ask someone to hold it for you, if possible.  Clearly mark and make sure that you can see:  Any grab bars or handrails.  First and last steps.  Where the edge of each step is.  Use tools that help you move around (mobility aids) if they are needed. These include:  Canes.  Walkers.  Scooters.  Crutches.  Turn on the lights when you go into a dark area. Replace any light bulbs as soon as they burn out.  Set up your furniture so you have a clear path. Avoid moving your furniture around.  If any of your floors are uneven, fix them.  If there are any pets around you, be aware of where they are.  Review your medicines with your doctor. Some medicines can make you feel dizzy. This can increase your chance of falling. Ask your doctor what other things that you can do to help prevent falls. This information is not intended to replace advice given to you by your health care provider. Make sure you discuss any questions you have with your health care provider. Document Released: 05/13/2009 Document Revised: 12/23/2015 Document Reviewed: 08/21/2014 Elsevier Interactive Patient Education  2017 [employer].

## 2017-10-17 ENCOUNTER — Other Ambulatory Visit: Payer: Self-pay | Admitting: Family Medicine

## 2017-10-17 DIAGNOSIS — C50911 Malignant neoplasm of unspecified site of right female breast: Secondary | ICD-10-CM

## 2017-10-17 MED ORDER — CARVEDILOL 12.5 MG PO TABS: 12.5000 mg | ORAL_TABLET | Freq: Two times a day (BID) | ORAL | 3 refills | Status: DC

## 2017-10-17 NOTE — Telephone Encounter (Signed)
[facility] the following medication. Thanks CC  carvedilol (COREG) 12.5 MG tablet

## 2017-10-22 ENCOUNTER — Other Ambulatory Visit: Payer: Self-pay | Admitting: Family Medicine

## 2017-10-26 ENCOUNTER — Other Ambulatory Visit: Payer: Self-pay | Admitting: Family Medicine

## 2017-10-26 MED ORDER — FLUOXETINE HCL 10 MG PO CAPS: ORAL_CAPSULE | ORAL | 2 refills | Status: DC

## 2017-10-26 NOTE — Telephone Encounter (Signed)
Pt requesting refill of Fluoxetine 10 [facility] [address] .  States she is out and needs the mediation.

## 2017-11-20 ENCOUNTER — Ambulatory Visit
Admission: RE | Admit: 2017-11-20 | Discharge: 2017-11-20 | Disposition: A | Discharge status: Home / Self Care | Payer: PPO | Source: Ambulatory Visit | Attending: Internal Medicine | Admitting: Internal Medicine

## 2017-11-20 ENCOUNTER — Other Ambulatory Visit: Payer: Self-pay | Admitting: Family Medicine

## 2017-11-20 DIAGNOSIS — C50811 Malignant neoplasm of overlapping sites of right female breast: Secondary | ICD-10-CM

## 2017-11-20 DIAGNOSIS — Z79811 Long term (current) use of aromatase inhibitors: Secondary | ICD-10-CM | POA: Insufficient documentation

## 2017-11-20 DIAGNOSIS — M85851 Other specified disorders of bone density and structure, right thigh: Secondary | ICD-10-CM | POA: Diagnosis not present

## 2017-11-20 DIAGNOSIS — Z780 Asymptomatic menopausal state: Secondary | ICD-10-CM | POA: Diagnosis not present

## 2017-11-20 DIAGNOSIS — Z853 Personal history of malignant neoplasm of breast: Secondary | ICD-10-CM | POA: Insufficient documentation

## 2017-11-20 DIAGNOSIS — Z1231 Encounter for screening mammogram for malignant neoplasm of breast: Secondary | ICD-10-CM | POA: Diagnosis not present

## 2017-11-20 HISTORY — DX: Personal history of irradiation: Z92.3

## 2017-11-20 HISTORY — DX: Personal history of antineoplastic chemotherapy: Z92.21

## 2017-11-20 NOTE — Telephone Encounter (Signed)
[facility] for a 90-days supply for the following medication. Thanks CC  FLUoxetine (PROZAC) 10 MG capsule

## 2017-11-21 MED ORDER — FLUOXETINE HCL 10 MG PO CAPS: ORAL_CAPSULE | ORAL | 2 refills | Status: DC

## 2017-11-21 NOTE — Telephone Encounter (Signed)
Requesting 90 day supply.

## 2017-11-26 ENCOUNTER — Other Ambulatory Visit: Payer: Self-pay | Admitting: Family Medicine

## 2017-11-26 MED ORDER — HYDROCHLOROTHIAZIDE 12.5 MG PO TABS: 12.5000 mg | ORAL_TABLET | Freq: Every day | ORAL | 3 refills | Status: DC

## 2017-11-26 MED ORDER — LOSARTAN POTASSIUM 50 MG PO TABS: 50.0000 mg | ORAL_TABLET | Freq: Every day | ORAL | 3 refills | Status: DC

## 2017-11-26 NOTE — Progress Notes (Signed)
Change to separate losartan and hctz prescriptions per [facility].

## 2017-11-27 ENCOUNTER — Ambulatory Visit: Payer: PPO | Admitting: General Surgery

## 2017-12-10 ENCOUNTER — Ambulatory Visit (INDEPENDENT_AMBULATORY_CARE_PROVIDER_SITE_OTHER): Payer: PPO | Admitting: Family Medicine

## 2017-12-10 ENCOUNTER — Encounter: Payer: Self-pay | Admitting: Family Medicine

## 2017-12-10 VITALS — BP 90/64 | HR 86 | Temp 98.5°F | Resp 16 | Wt 144.0 lb

## 2017-12-10 DIAGNOSIS — E119 Type 2 diabetes mellitus without complications: Secondary | ICD-10-CM | POA: Diagnosis not present

## 2017-12-10 DIAGNOSIS — I10 Essential (primary) hypertension: Secondary | ICD-10-CM

## 2017-12-10 LAB — POCT GLYCOSYLATED HEMOGLOBIN (HGB A1C)
Est. average glucose Bld gHb Est-mCnc: 171
HEMOGLOBIN A1C: 7.6

## 2017-12-10 NOTE — Progress Notes (Deleted)
       Patient: Hannah [name] Female    DOB: [birth year]   67 y.o.   MRN: [id number] Visit Date: 12/10/2017  Today's Provider: Lelon Huh, MD   No chief complaint on file.  Subjective:    HPI    Allergies  Allergen Reactions  . Azithromycin Swelling    Itching, swelling, and rash  . Codeine Other (See Comments)    Mental changes  . Penicillins Itching and Rash     Current Outpatient Medications:  .  anastrozole (ARIMIDEX) 1 MG tablet, Take 1 tablet (1 mg total) by mouth daily., Disp: 90 tablet, Rfl: 3 .  carvedilol (COREG) 12.5 MG tablet, Take 1 tablet (12.5 mg total) by mouth 2 (two) times daily., Disp: 180 tablet, Rfl: 3 .  Cholecalciferol (VITAMIN D3) 1000 UNITS CAPS, Take 1 capsule by mouth daily., Disp: , Rfl:  .  FLUoxetine (PROZAC) 10 MG capsule, TAKE 3 CAPSULES BY MOUTH ONCE A DAY FOR ANXIETY, Disp: 270 capsule, Rfl: 2 .  fluticasone (FLONASE) 50 MCG/ACT nasal spray, fluticasone propionate as needed, Disp: , Rfl:  .  glipiZIDE (GLUCOTROL XL) 2.5 MG 24 hr tablet, take 1 tablet by mouth once daily for diabetes, Disp: 180 tablet, Rfl: 3 .  hydrochlorothiazide (HYDRODIURIL) 12.5 MG tablet, Take 1 tablet (12.5 mg total) by mouth daily., Disp: 90 tablet, Rfl: 3 .  losartan (COZAAR) 50 MG tablet, Take 1 tablet (50 mg total) by mouth daily. PLEASE CANCEL LOSARTAN-HCTZ COMBO REFILLS, Disp: 90 tablet, Rfl: 3 .  metFORMIN (GLUCOPHAGE) 1000 MG tablet, take 1 tablet by mouth twice a day for diabetes, Disp: 60 tablet, Rfl: 11 .  simvastatin (ZOCOR) 40 MG tablet, Take 1 tablet (40 mg total) by mouth at bedtime., Disp: 90 tablet, Rfl: 3  Review of Systems  Constitutional: Negative for appetite change, chills, fatigue and fever.  Respiratory: Negative for chest tightness and shortness of breath.   Cardiovascular: Negative for chest pain and palpitations.  Gastrointestinal: Negative for abdominal pain, nausea and vomiting.  Neurological: Negative for dizziness and weakness.     Social History   Tobacco Use  . Smoking status: Never Smoker  . Smokeless tobacco: Never Used  Substance Use Topics  . Alcohol use: No   Objective:   LMP 08/30/2003  There were no vitals filed for this visit.   Physical Exam      Assessment & Plan:           Lelon Huh, MD  [facility]

## 2017-12-10 NOTE — Patient Instructions (Signed)
   Engage in moderate exercise such as a brisk walk for 30 minutes a day

## 2017-12-10 NOTE — Progress Notes (Signed)
Patient: Hannah [name] Female    DOB: [birth year]   67 y.o.   MRN: [id number] Visit Date: 12/10/2017  Today's Provider: Lelon Huh, MD   Chief Complaint  Patient presents with  . Hypertension    follow up  . Diabetes    follow up   Subjective:    HPI  Diabetes Mellitus Type II, Follow-up:   Lab Results  Component Value Date   HGBA1C 7.2 (H) 06/13/2017   HGBA1C 7.1 (H) 08/09/2016   HGBA1C 7.2 (H) 02/07/2016    Last seen for diabetes 6 months ago.  Management since then includes no changes. She reports good compliance with treatment. She is not having side effects.  Current symptoms include none and have been stable. Home blood sugar records: fasting range: 130's  Episodes of hypoglycemia? no   Current Insulin Regimen: single episode occurred 3 weeks ago Most Recent Eye Exam: 1 year ago Weight trend: fluctuating a bit Prior visit with dietician: no Current diet: in general, an "unhealthy" diet Current exercise: none  Pertinent Labs:    Component Value Date/Time   CHOL 146 06/13/2017 0844   TRIG 99 06/13/2017 0844   HDL 83 06/13/2017 0844   LDLCALC 45 06/13/2017 0844   CREATININE 0.69 06/13/2017 0844    Wt Readings from Last 3 Encounters:  06/11/17 149 lb (67.6 kg)  03/14/17 146 lb (66.2 kg)  11/22/16 145 lb (65.8 kg)    ------------------------------------------------------------------------  Hypertension, follow-up:  BP Readings from Last 3 Encounters:  10/15/17 124/62  06/11/17 122/72  03/14/17 112/75    She was last seen for hypertension 6 months ago.  BP at that visit was 122/72. Management since that visit includes no changes. She reports good compliance with treatment. She is not having side effects.  She is not exercising. She is not adherent to low salt diet.   Outside blood pressures are not being checked regularly. She is experiencing none.  Patient denies chest pain, chest pressure/discomfort, claudication, dyspnea,  exertional chest pressure/discomfort, fatigue, irregular heart beat, lower extremity edema, near-syncope, orthopnea, palpitations, paroxysmal nocturnal dyspnea, syncope and tachypnea.   Cardiovascular risk factors include advanced age (older than 102 for men, 81 for women), diabetes mellitus and hypertension.  Use of agents associated with hypertension: none.     Weight trend: fluctuating a bit Wt Readings from Last 3 Encounters:  06/11/17 149 lb (67.6 kg)  03/14/17 146 lb (66.2 kg)  11/22/16 145 lb (65.8 kg)    Current diet: in general, an "unhealthy" diet  ------------------------------------------------------------------------     Allergies  Allergen Reactions  . Azithromycin Swelling    Itching, swelling, and rash  . Codeine Other (See Comments)    Mental changes  . Penicillins Itching and Rash     Current Outpatient Medications:  .  anastrozole (ARIMIDEX) 1 MG tablet, Take 1 tablet (1 mg total) by mouth daily., Disp: 90 tablet, Rfl: 3 .  carvedilol (COREG) 12.5 MG tablet, Take 1 tablet (12.5 mg total) by mouth 2 (two) times daily., Disp: 180 tablet, Rfl: 3 .  Cholecalciferol (VITAMIN D3) 1000 UNITS CAPS, Take 1 capsule by mouth daily., Disp: , Rfl:  .  FLUoxetine (PROZAC) 10 MG capsule, TAKE 3 CAPSULES BY MOUTH ONCE A DAY FOR ANXIETY, Disp: 270 capsule, Rfl: 2 .  fluticasone (FLONASE) 50 MCG/ACT nasal spray, fluticasone propionate as needed, Disp: , Rfl:  .  glipiZIDE (GLUCOTROL XL) 2.5 MG 24 hr tablet, take 1 tablet by mouth once  daily for diabetes, Disp: 180 tablet, Rfl: 3 .  hydrochlorothiazide (HYDRODIURIL) 12.5 MG tablet, Take 1 tablet (12.5 mg total) by mouth daily., Disp: 90 tablet, Rfl: 3 .  losartan (COZAAR) 50 MG tablet, Take 1 tablet (50 mg total) by mouth daily. PLEASE CANCEL LOSARTAN-HCTZ COMBO REFILLS, Disp: 90 tablet, Rfl: 3 .  metFORMIN (GLUCOPHAGE) 1000 MG tablet, take 1 tablet by mouth twice a day for diabetes, Disp: 60 tablet, Rfl: 11 .  simvastatin  (ZOCOR) 40 MG tablet, Take 1 tablet (40 mg total) by mouth at bedtime., Disp: 90 tablet, Rfl: 3  Review of Systems  Constitutional: Negative for appetite change, chills, fatigue and fever.  Respiratory: Negative for chest tightness and shortness of breath.   Cardiovascular: Negative for chest pain and palpitations.  Gastrointestinal: Negative for abdominal pain, nausea and vomiting.  Neurological: Negative for dizziness and weakness.    Social History   Tobacco Use  . Smoking status: Never Smoker  . Smokeless tobacco: Never Used  Substance Use Topics  . Alcohol use: No   Objective:   BP 90/64 (BP Location: Left Arm, Patient Position: Sitting, Cuff Size: Normal)   Pulse 86   Temp 98.5 F (36.9 C) (Oral)   Resp 16   Wt 144 lb (65.3 kg)   LMP 08/30/2003   SpO2 99% Comment: room air  BMI 28.12 kg/m   Wt Readings from Last 3 Encounters:  12/10/17 144 lb (65.3 kg)  06/11/17 149 lb (67.6 kg)  03/14/17 146 lb (66.2 kg)     Physical Exam  General appearance: alert, well developed, well nourished, cooperative and in no distress Head: Normocephalic, without obvious abnormality, atraumatic Respiratory: Respirations even and unlabored, normal respiratory rate Extremities: No gross deformities Skin: Skin color, texture, turgor normal. No rashes seen  Psych: Appropriate mood and affect. Neurologic: Mental status: Alert, oriented to person, place, and time, thought content appropriate. Results for orders placed or performed in visit on 12/10/17  POCT HgB A1C  Result Value Ref Range   Hemoglobin A1C 7.6    Est. average glucose Bld gHb Est-mCnc 171        Assessment & Plan:     1. Type 2 diabetes mellitus without complication, without long-term current use of insulin (HCC) She reports she has not been avoid sweets and starchy foods very well, and that she has not been exercising. Given diabetic diet information and encouraged to walk 30 minutes a day when able. Continue current  medications. Consider increasing glipizide if not improving at follow up.  - POCT HgB A1C  2. Essential hypertension Well controlled.  Continue current medications.         Lelon Huh, MD  [facility]

## 2017-12-20 DIAGNOSIS — H5711 Ocular pain, right eye: Secondary | ICD-10-CM | POA: Diagnosis not present

## 2017-12-20 DIAGNOSIS — H02053 Trichiasis without entropian right eye, unspecified eyelid: Secondary | ICD-10-CM | POA: Diagnosis not present

## 2018-01-01 ENCOUNTER — Encounter: Payer: Self-pay | Admitting: General Surgery

## 2018-01-01 ENCOUNTER — Ambulatory Visit (INDEPENDENT_AMBULATORY_CARE_PROVIDER_SITE_OTHER): Payer: PPO | Admitting: General Surgery

## 2018-01-01 VITALS — BP 138/76 | HR 82 | Resp 14 | Ht 60.0 in | Wt 142.0 lb

## 2018-01-01 DIAGNOSIS — C50811 Malignant neoplasm of overlapping sites of right female breast: Secondary | ICD-10-CM | POA: Diagnosis not present

## 2018-01-01 NOTE — Patient Instructions (Signed)
Return as needed.The patient is aware to call back for any questions or concerns.  

## 2018-01-01 NOTE — Progress Notes (Signed)
Patient ID: Hannah [name], female   DOB: 07-Mar-1951, 67 y.o.   MRN: [id number]  Chief Complaint  Patient presents with  . Follow-up    mammogram    HPI Hannah [name] is a 67 y.o. female who presents for a breast evaluation. The most recent mammogram was done on 11/20/17. Patient does perform regular self breast checks and gets regular mammograms done. She is doing well on the Arimidex.    HPI  Past Medical History:  Diagnosis Date  . Allergy   . Arthritis   . Cancer ([place]) 2011-Right    DCIS. 9 mm histologic grade 2 invasive mammary carcinoma, ER 90%, PR 5%, HER-2/neu not over expressed. Sentinel nodes negative.  . Chicken pox   . Diabetes mellitus without complication ([place])   . Fractured elbow   . History of lumpectomy 2011   Right breast  . Hyperlipidemia   . Hypertension 2001  . Personal history of chemotherapy 2011   Right Breast  . Personal history of radiation therapy 2011   Right breast    Past Surgical History:  Procedure Laterality Date  . BREAST BIOPSY Right 2011   Invasive Ductal Carcinoma  . BREAST LUMPECTOMY Right 2011  . BREAST SURGERY Right 2011    right breast wide excision, partial breast radiation.  . COLONOSCOPY  Declines   Reports she has annual stool fecal occult blood testing.  [name] DILATION AND CURETTAGE OF UTERUS  1990  . PORT-A-CATH REMOVAL  2012  . right breast wide excision  2011  . WRIST SURGERY  1998   pins placed    Family History  Problem Relation Age of Onset  . Arthritis Mother   . Alzheimer's disease Mother   . Arthritis Father   . Heart disease Father   . Arthritis Maternal Grandmother   . Breast cancer Maternal Grandmother   . Alzheimer's disease Maternal Grandmother   . Stroke Maternal Grandfather     Social History Social History   Tobacco Use  . Smoking status: Never Smoker  . Smokeless tobacco: Never Used  Substance Use Topics  . Alcohol use: No  . Drug use: No    Allergies  Allergen Reactions  .  Azithromycin Swelling    Itching, swelling, and rash  . Codeine Other (See Comments)    Mental changes  . Penicillins Itching and Rash    Current Outpatient Medications  Medication Sig Dispense Refill  . anastrozole (ARIMIDEX) 1 MG tablet Take 1 tablet (1 mg total) by mouth daily. 90 tablet 3  . carvedilol (COREG) 12.5 MG tablet Take 1 tablet (12.5 mg total) by mouth 2 (two) times daily. 180 tablet 3  . Cholecalciferol (VITAMIN D3) 1000 UNITS CAPS Take 1 capsule by mouth daily.    [name] FLUoxetine (PROZAC) 10 MG capsule TAKE 3 CAPSULES BY MOUTH ONCE A DAY FOR ANXIETY 270 capsule 2  . fluticasone (FLONASE) 50 MCG/ACT nasal spray fluticasone propionate as needed    . glipiZIDE (GLUCOTROL XL) 2.5 MG 24 hr tablet take 1 tablet by mouth once daily for diabetes 180 tablet 3  . losartan (COZAAR) 50 MG tablet Take 1 tablet (50 mg total) by mouth daily. PLEASE CANCEL LOSARTAN-HCTZ COMBO REFILLS 90 tablet 3  . metFORMIN (GLUCOPHAGE) 1000 MG tablet take 1 tablet by mouth twice a day for diabetes 60 tablet 11  . simvastatin (ZOCOR) 40 MG tablet Take 1 tablet (40 mg total) by mouth at bedtime. 90 tablet 3   No current facility-administered medications for  this visit.     Review of Systems Review of Systems  Constitutional: Negative.   Respiratory: Negative.   Cardiovascular: Negative.     Blood pressure 138/76, pulse 82, resp. rate 14, height 5' (1.524 m), weight 142 lb (64.4 kg), last menstrual period 08/30/2003.  Physical Exam Physical Exam  Constitutional: She is oriented to person, place, and time. She appears well-developed and well-nourished.  Eyes: Conjunctivae are normal. No scleral icterus.  Neck: Neck supple.  Cardiovascular: Normal rate, regular rhythm and normal heart sounds.  Pulmonary/Chest: Effort normal and breath sounds normal. Right breast exhibits no inverted nipple, no mass, no nipple discharge, no skin change and no tenderness. Left breast exhibits no inverted nipple, no  mass, no nipple discharge, no skin change and no tenderness.    Lymphadenopathy:    She has no cervical adenopathy.    She has no axillary adenopathy.  Neurological: She is alert and oriented to person, place, and time.  Skin: Skin is warm and dry.    Data Reviewed Bilateral diagnostic mammograms dated November 20, 2017 were reviewed.  BI-RADS-2.  Cologuard dated April 05, 2017 was negative.  Assessment    Benign breast exam now 8 years post surgical therapy.    Plan  Return as needed.The patient is aware to call back for any questions or concerns.  Dr. Rogue Bussing had arranged for her most recent mammogram and continues to follow the patient for her T1BN0 previous right breast cancer.    I will ask him to arrange for annual mammograms (screening) and complete a comprehensive breast clinical exam.    Patient is welcome to return at any time should she have any new concerns or if new radiologic findings are appreciated. Physician/  HPI, Physical Exam, Assessment and Plan have been scribed under the direction and in the presence of Hervey Ard, MD.  Gaspar Cola, CMA  I have completed the exam and reviewed the above documentation for accuracy and completeness.  I agree with the above.  Haematologist has been used and any errors in dictation or transcription are unintentional.  Hervey Ard, M.D., F.A.C.S.  Hannah [name] 01/01/2018, 9:18 PM

## 2018-03-14 ENCOUNTER — Other Ambulatory Visit: Payer: Self-pay

## 2018-03-14 ENCOUNTER — Inpatient Hospital Stay (HOSPITAL_BASED_OUTPATIENT_CLINIC_OR_DEPARTMENT_OTHER): Payer: PPO | Admitting: Internal Medicine

## 2018-03-14 ENCOUNTER — Inpatient Hospital Stay: Payer: PPO | Attending: Internal Medicine

## 2018-03-14 VITALS — BP 108/71 | HR 75 | Temp 97.8°F | Resp 16

## 2018-03-14 DIAGNOSIS — C50811 Malignant neoplasm of overlapping sites of right female breast: Secondary | ICD-10-CM | POA: Diagnosis not present

## 2018-03-14 DIAGNOSIS — Z170 Estrogen receptor positive status [ER+]: Secondary | ICD-10-CM | POA: Diagnosis not present

## 2018-03-14 DIAGNOSIS — M1990 Unspecified osteoarthritis, unspecified site: Secondary | ICD-10-CM

## 2018-03-14 DIAGNOSIS — E119 Type 2 diabetes mellitus without complications: Secondary | ICD-10-CM

## 2018-03-14 DIAGNOSIS — Z79811 Long term (current) use of aromatase inhibitors: Secondary | ICD-10-CM | POA: Insufficient documentation

## 2018-03-14 LAB — COMPREHENSIVE METABOLIC PANEL
ALK PHOS: 51 U/L (ref 38–126)
ALT: 17 U/L (ref 0–44)
AST: 24 U/L (ref 15–41)
Albumin: 4.1 g/dL (ref 3.5–5.0)
Anion gap: 13 (ref 5–15)
BUN: 20 mg/dL (ref 8–23)
CALCIUM: 9.4 mg/dL (ref 8.9–10.3)
CO2: 22 mmol/L (ref 22–32)
CREATININE: 0.75 mg/dL (ref 0.44–1.00)
Chloride: 101 mmol/L (ref 98–111)
GFR calc non Af Amer: >60 mL/min (ref >60)
Glucose, Bld: 187 mg/dL — ABNORMAL HIGH (ref 70–99)
Potassium: 4 mmol/L (ref 3.5–5.1)
SODIUM: 136 mmol/L (ref 135–145)
Total Bilirubin: 0.8 mg/dL (ref 0.3–1.2)
Total Protein: 6.9 g/dL (ref 6.5–8.1)

## 2018-03-14 LAB — CBC WITH DIFFERENTIAL/PLATELET
BASOS PCT: 1 %
Basophils Absolute: 0 10*3/uL (ref 0–0.1)
EOS ABS: 0.2 10*3/uL (ref 0–0.7)
EOS PCT: 4 %
HCT: 36.4 % (ref 35.0–47.0)
HEMOGLOBIN: 12.4 g/dL (ref 12.0–16.0)
LYMPHS ABS: 1.7 10*3/uL (ref 1.0–3.6)
Lymphocytes Relative: 27 %
MCH: 31.2 pg (ref 26.0–34.0)
MCHC: 34 g/dL (ref 32.0–36.0)
MCV: 91.6 fL (ref 80.0–100.0)
MONOS PCT: 8 %
Monocytes Absolute: 0.5 10*3/uL (ref 0.2–0.9)
NEUTROS PCT: 60 %
Neutro Abs: 3.8 10*3/uL (ref 1.4–6.5)
PLATELETS: 422 10*3/uL (ref 150–440)
RBC: 3.97 MIL/uL (ref 3.80–5.20)
RDW: 12.9 % (ref 11.5–14.5)
WBC: 6.2 10*3/uL (ref 3.6–11.0)

## 2018-03-14 NOTE — Assessment & Plan Note (Addendum)
#   STAGE I Breast ca ER/PR-Pos; her 2 Neu NEG [high risk oncotype]. Patient Arimidex/extended duration.  Clinically stable.  No evidence of recurrence.  2019 mammogram normal.  #Arthritis-secondary to AI; improved.  #Fasting blood sugars 187-diabetes not well controlled.  Recommend follow-up with PCP.  #BMD June 2019 normal.  Stable.  Continue calcium with vitamin D.  # follow up in 12 months/ labs.

## 2018-03-14 NOTE — Progress Notes (Signed)
Hannah [name] OFFICE PROGRESS NOTE  Patient Care Team: Birdie Sons, MD as PCP - General (Family Medicine) Bary Castilla, Forest Gleason, MD (General Surgery) Cammie Sickle, MD as Consulting Physician (Internal Medicine)  Cancer Staging No matching staging information was found for the patient.   Oncology History   # AUG 201- T1b N0 M0 (clinical, stage I) infiltrating ductal carcinoma of the right breast status post wide local excision and sentinel node study March 14, 2010. Tumor size 9 mm, grade 2, margins negative.  2 sentinel lymph nodes negative.  ER positive (90%), PR borderline (5%) HER-2/neu 2+ on IHC, negative on FISH. Her2/CEP ratio is 1.45. April 11, 2010 - Oncotype DX score is 35 (high risk group) with average rate of distant recurrence of 24%. Got mammosite.  Patient completed adjuvant chemotherapy with Taxotere/Cytoxan, then radiation, then started aromatase inhibitor in February 2012. ---------------------------------------  DIAGNOSIS: RIGHT BREAST CA  STAGE:  I       ;GOALS: CURE  CURRENT/MOST RECENT THERAPY : Arimidex      Malignant neoplasm of overlapping sites of right breast ([place])      INTERVAL HISTORY:  Hannah [name] 67 y.o.  female pleasant patient above history of breast cancer ER PR positive HER-2 negative currently on Arimidex is here for follow-up.  She continues have chronic mild joint pain back pain not any worse.  Improved.  No new lumps or bumps.  Appetite is good.  No chest pain shortness with cough.  Review of Systems  Constitutional: Negative for chills, diaphoresis, fever, malaise/fatigue and weight loss.  HENT: Negative for nosebleeds and sore throat.   Eyes: Negative for double vision.  Respiratory: Negative for cough, hemoptysis, sputum production, shortness of breath and wheezing.   Cardiovascular: Negative for chest pain, palpitations, orthopnea and leg swelling.  Gastrointestinal: Negative for abdominal pain,  blood in stool, constipation, diarrhea, heartburn, melena, nausea and vomiting.  Genitourinary: Negative for dysuria, frequency and urgency.  Musculoskeletal: Positive for back pain and joint pain.  Skin: Negative.  Negative for itching and rash.  Neurological: Negative for dizziness, tingling, focal weakness, weakness and headaches.  Endo/Heme/Allergies: Does not bruise/bleed easily.  Psychiatric/Behavioral: Negative for depression. The patient is not nervous/anxious and does not have insomnia.       PAST MEDICAL HISTORY :  Past Medical History:  Diagnosis Date  . Allergy   . Arthritis   . Cancer ([place]) 2011-Right    DCIS. 9 mm histologic grade 2 invasive mammary carcinoma, ER 90%, PR 5%, HER-2/neu not over expressed. Sentinel nodes negative.  . Chicken pox   . Diabetes mellitus without complication ([place])   . Fractured elbow   . History of lumpectomy 2011   Right breast  . Hyperlipidemia   . Hypertension 2001  . Personal history of chemotherapy 2011   Right Breast  . Personal history of radiation therapy 2011   Right breast    PAST SURGICAL HISTORY :   Past Surgical History:  Procedure Laterality Date  . BREAST BIOPSY Right 2011   Invasive Ductal Carcinoma  . BREAST LUMPECTOMY Right 2011  . BREAST SURGERY Right 2011    right breast wide excision, partial breast radiation.  . COLONOSCOPY  Declines   Reports she has annual stool fecal occult blood testing.  [name] DILATION AND CURETTAGE OF UTERUS  1990  . PORT-A-CATH REMOVAL  2012  . right breast wide excision  2011  . WRIST SURGERY  1998   pins placed    FAMILY  HISTORY :   Family History  Problem Relation Age of Onset  . Arthritis Mother   . Alzheimer's disease Mother   . Arthritis Father   . Heart disease Father   . Arthritis Maternal Grandmother   . Breast cancer Maternal Grandmother   . Alzheimer's disease Maternal Grandmother   . Stroke Maternal Grandfather     SOCIAL HISTORY:   Social History   Tobacco  Use  . Smoking status: Never Smoker  . Smokeless tobacco: Never Used  Substance Use Topics  . Alcohol use: No  . Drug use: No    ALLERGIES:  is allergic to azithromycin; codeine; and penicillins.  MEDICATIONS:  Current Outpatient Medications  Medication Sig Dispense Refill  . anastrozole (ARIMIDEX) 1 MG tablet Take 1 tablet (1 mg total) by mouth daily. 90 tablet 3  . carvedilol (COREG) 12.5 MG tablet Take 1 tablet (12.5 mg total) by mouth 2 (two) times daily. 180 tablet 3  . Cholecalciferol (VITAMIN D3) 1000 UNITS CAPS Take 1 capsule by mouth daily.    [name] FLUoxetine (PROZAC) 10 MG capsule TAKE 3 CAPSULES BY MOUTH ONCE A DAY FOR ANXIETY 270 capsule 2  . fluticasone (FLONASE) 50 MCG/ACT nasal spray fluticasone propionate as needed    . glipiZIDE (GLUCOTROL XL) 2.5 MG 24 hr tablet take 1 tablet by mouth once daily for diabetes 180 tablet 3  . losartan (COZAAR) 50 MG tablet Take 1 tablet (50 mg total) by mouth daily. PLEASE CANCEL LOSARTAN-HCTZ COMBO REFILLS 90 tablet 3  . metFORMIN (GLUCOPHAGE) 1000 MG tablet take 1 tablet by mouth twice a day for diabetes 60 tablet 11  . simvastatin (ZOCOR) 40 MG tablet Take 1 tablet (40 mg total) by mouth at bedtime. 90 tablet 3   No current facility-administered medications for this visit.     PHYSICAL EXAMINATION: ECOG PERFORMANCE STATUS: 0 - Asymptomatic  BP 108/71 (BP Location: Left Arm, Patient Position: Sitting)   Pulse 75   Temp 97.8 F (36.6 C) (Tympanic)   Resp 16   LMP 08/30/2003   There were no vitals filed for this visit.  Physical Exam  Constitutional: She is oriented to person, place, and time and well-developed, well-nourished, and in no distress.  HENT:  Head: Normocephalic and atraumatic.  Mouth/Throat: Oropharynx is clear and moist. No oropharyngeal exudate.  Eyes: Pupils are equal, round, and reactive to light.  Neck: Normal range of motion. Neck supple.  Cardiovascular: Normal rate and regular rhythm.  Pulmonary/Chest:  No respiratory distress. She has no wheezes.  Abdominal: Soft. Bowel sounds are normal. She exhibits no distension and no mass. There is no tenderness. There is no rebound and no guarding.  Musculoskeletal: Normal range of motion. She exhibits no edema or tenderness.  Neurological: She is alert and oriented to person, place, and time.  Skin: Skin is warm.  Psychiatric: Affect normal.       LABORATORY DATA:  I have reviewed the data as listed    Component Value Date/Time   NA 136 03/14/2018 0931   K 4.0 03/14/2018 0931   CL 101 03/14/2018 0931   CO2 22 03/14/2018 0931   GLUCOSE 187 (H) 03/14/2018 0931   BUN 20 03/14/2018 0931   CREATININE 0.75 03/14/2018 0931   CREATININE 0.69 06/13/2017 0844   CALCIUM 9.4 03/14/2018 0931   CALCIUM 9.9 02/10/2014 1458   PROT 6.9 03/14/2018 0931   PROT 7.4 02/10/2014 1458   ALBUMIN 4.1 03/14/2018 0931   ALBUMIN 3.8 02/10/2014 1458  AST 24 03/14/2018 0931   AST 17 02/10/2014 1458   ALT 17 03/14/2018 0931   ALT 34 02/10/2014 1458   ALKPHOS 51 03/14/2018 0931   ALKPHOS 69 02/10/2014 1458   BILITOT 0.8 03/14/2018 0931   BILITOT 0.5 02/10/2014 1458   GFRNONAA >60 03/14/2018 0931   GFRNONAA 91 06/13/2017 0844   GFRAA >60 03/14/2018 0931   GFRAA 105 06/13/2017 0844    No results found for: SPEP, UPEP  Lab Results  Component Value Date   WBC 6.2 03/14/2018   NEUTROABS 3.8 03/14/2018   HGB 12.4 03/14/2018   HCT 36.4 03/14/2018   MCV 91.6 03/14/2018   PLT 422 03/14/2018      Chemistry      Component Value Date/Time   NA 136 03/14/2018 0931   K 4.0 03/14/2018 0931   CL 101 03/14/2018 0931   CO2 22 03/14/2018 0931   BUN 20 03/14/2018 0931   CREATININE 0.75 03/14/2018 0931   CREATININE 0.69 06/13/2017 0844      Component Value Date/Time   CALCIUM 9.4 03/14/2018 0931   CALCIUM 9.9 02/10/2014 1458   ALKPHOS 51 03/14/2018 0931   ALKPHOS 69 02/10/2014 1458   AST 24 03/14/2018 0931   AST 17 02/10/2014 1458   ALT 17 03/14/2018  0931   ALT 34 02/10/2014 1458   BILITOT 0.8 03/14/2018 0931   BILITOT 0.5 02/10/2014 1458       RADIOGRAPHIC STUDIES: I have personally reviewed the radiological images as listed and agreed with the findings in the report. No results found.   ASSESSMENT & PLAN:  Malignant neoplasm of overlapping sites of right breast ([place]) # STAGE I Breast ca ER/PR-Pos; her 2 Neu NEG [high risk oncotype]. Patient Arimidex/extended duration.  Clinically stable.  No evidence of recurrence.  2019 mammogram normal.  #Arthritis-secondary to AI; improved.  #Fasting blood sugars 187-diabetes not well controlled.  Recommend follow-up with PCP.  #BMD June 2019 normal.  Stable.  Continue calcium with vitamin D.  # follow up in 12 months/ labs.    Orders Placed This Encounter  Procedures  . MM 3D SCREEN BREAST BILATERAL    Standing Status:   Future    Standing Expiration Date:   05/15/2019    Order Specific Question:   Reason for Exam (SYMPTOM  OR DIAGNOSIS REQUIRED)    Answer:   Breast cancer    Order Specific Question:   Preferred imaging location?    Answer:   [facility]   All questions were answered. The patient knows to call the clinic with any problems, questions or concerns.      Cammie Sickle, MD 03/19/2018 10:03 PM

## 2018-03-15 LAB — CANCER ANTIGEN 27.29: CA 27.29: 4.6 U/mL (ref 0.0–38.6)

## 2018-04-08 ENCOUNTER — Other Ambulatory Visit: Payer: Self-pay | Admitting: Internal Medicine

## 2018-04-08 DIAGNOSIS — C50811 Malignant neoplasm of overlapping sites of right female breast: Secondary | ICD-10-CM

## 2018-04-08 DIAGNOSIS — Z79811 Long term (current) use of aromatase inhibitors: Secondary | ICD-10-CM

## 2018-04-12 ENCOUNTER — Ambulatory Visit: Payer: Self-pay | Admitting: Family Medicine

## 2018-06-04 ENCOUNTER — Encounter: Payer: Self-pay | Admitting: Family Medicine

## 2018-06-04 ENCOUNTER — Ambulatory Visit (INDEPENDENT_AMBULATORY_CARE_PROVIDER_SITE_OTHER): Payer: PPO | Admitting: Family Medicine

## 2018-06-04 VITALS — BP 102/70 | HR 75 | Temp 98.4°F | Resp 16 | Ht 60.0 in | Wt 144.0 lb

## 2018-06-04 DIAGNOSIS — E119 Type 2 diabetes mellitus without complications: Secondary | ICD-10-CM | POA: Diagnosis not present

## 2018-06-04 DIAGNOSIS — E785 Hyperlipidemia, unspecified: Secondary | ICD-10-CM

## 2018-06-04 DIAGNOSIS — I10 Essential (primary) hypertension: Secondary | ICD-10-CM | POA: Diagnosis not present

## 2018-06-04 DIAGNOSIS — Z23 Encounter for immunization: Secondary | ICD-10-CM | POA: Diagnosis not present

## 2018-06-04 LAB — POCT GLYCOSYLATED HEMOGLOBIN (HGB A1C)
ESTIMATED AVERAGE GLUCOSE: 163
HEMOGLOBIN A1C: 7.3 % — AB (ref 4.0–5.6)

## 2018-06-04 NOTE — Patient Instructions (Signed)
   Stop taking losartan-hctz combination pill   Continue losartain 50mg  a day    Continue hydrochlorothiazide 12.5mg  a day

## 2018-06-04 NOTE — Progress Notes (Signed)
Patient: Hannah [name] Female    DOB: 1951/03/03   67 y.o.   MRN: [id number] Visit Date: 06/04/2018  Today's Provider: Lelon Huh, MD   Chief Complaint  Patient presents with  . Diabetes  . Hypertension   Subjective:    HPI  Diabetes Mellitus Type II, Follow-up:   Lab Results  Component Value Date   HGBA1C 7.6 12/10/2017   HGBA1C 7.2 (H) 06/13/2017   HGBA1C 7.1 (H) 08/09/2016    Last seen for diabetes 6 months ago.  Management since then includes no changes. Patient was given diabetic diet information and encouraged to walk 30 minutes daily when able. She reports good compliance with treatment. She is not having side effects.  Current symptoms include nausea and tremors and have been stable. Home blood sugar records: blood sugars are not checked   Episodes of hypoglycemia? no   Current Insulin Regimen: none Most Recent Eye Exam: <1 year ago Weight trend: fluctuating a bit Prior visit with dietician: no Current diet: in general, an "unhealthy" diet Current exercise: walking  Pertinent Labs:    Component Value Date/Time   CHOL 146 06/13/2017 0844   TRIG 99 06/13/2017 0844   HDL 83 06/13/2017 0844   LDLCALC 45 06/13/2017 0844   CREATININE 0.75 03/14/2018 0931   CREATININE 0.69 06/13/2017 0844    Wt Readings from Last 3 Encounters:  06/04/18 144 lb (65.3 kg)  01/01/18 142 lb (64.4 kg)  12/10/17 144 lb (65.3 kg)    ------------------------------------------------------------------------  Hypertension, follow-up:  BP Readings from Last 3 Encounters:  06/04/18 102/70  03/14/18 108/71  01/01/18 138/76    She was last seen for hypertension 6 months ago.  BP at that visit was 90/64. Management since that visit includes no changes. She reports good compliance with treatment. Was prescribed separate prescriptions for losartan and hctz earlier this year due to insurance. However, patient has been taking Losartan combo pill along with separate  Losartan and HCTZ.  She is having side effects. Shakiness for a few months. She is exercising. She is not adherent to low salt diet.   Outside blood pressures are not being checked. She is experiencing none.  Patient denies chest pain, chest pressure/discomfort, claudication, dyspnea, exertional chest pressure/discomfort, fatigue, irregular heart beat, lower extremity edema, near-syncope, orthopnea, palpitations, paroxysmal nocturnal dyspnea, syncope and tachypnea.   Cardiovascular risk factors include advanced age (older than 71 for men, 53 for women), diabetes mellitus and hypertension.  Use of agents associated with hypertension: none.     Weight trend: fluctuating a bit Wt Readings from Last 3 Encounters:  06/04/18 144 lb (65.3 kg)  01/01/18 142 lb (64.4 kg)  12/10/17 144 lb (65.3 kg)    Current diet: in general, an "unhealthy" diet  ------------------------------------------------------------------------     Allergies  Allergen Reactions  . Azithromycin Swelling    Itching, swelling, and rash  . Codeine Other (See Comments)    Mental changes  . Penicillins Itching and Rash     Current Outpatient Medications:  .  anastrozole (ARIMIDEX) 1 MG tablet, TAKE 1 TABLET EVERY DAY, Disp: 90 tablet, Rfl: 1 .  carvedilol (COREG) 12.5 MG tablet, Take 1 tablet (12.5 mg total) by mouth 2 (two) times daily., Disp: 180 tablet, Rfl: 3 .  Cholecalciferol (VITAMIN D3) 1000 UNITS CAPS, Take 1 capsule by mouth daily., Disp: , Rfl:  .  FLUoxetine (PROZAC) 10 MG capsule, TAKE 3 CAPSULES BY MOUTH ONCE A DAY FOR ANXIETY, Disp:  270 capsule, Rfl: 2 .  glipiZIDE (GLUCOTROL XL) 2.5 MG 24 hr tablet, take 1 tablet by mouth once daily for diabetes, Disp: 180 tablet, Rfl: 3 .  hydrochlorothiazide (HYDRODIURIL) 12.5 MG tablet, Take 1 tablet by mouth daily., Disp: , Rfl:  .  losartan (COZAAR) 50 MG tablet, Take 1 tablet (50 mg total) by mouth daily. PLEASE CANCEL LOSARTAN-HCTZ COMBO REFILLS, Disp: 90  tablet, Rfl: 3 .  metFORMIN (GLUCOPHAGE) 1000 MG tablet, take 1 tablet by mouth twice a day for diabetes, Disp: 60 tablet, Rfl: 11 .  simvastatin (ZOCOR) 40 MG tablet, Take 1 tablet (40 mg total) by mouth at bedtime., Disp: 90 tablet, Rfl: 3 .  fluticasone (FLONASE) 50 MCG/ACT nasal spray, fluticasone propionate as needed, Disp: , Rfl:   Review of Systems  Constitutional: Negative for appetite change, chills, fatigue and fever.  Respiratory: Negative for chest tightness and shortness of breath.   Cardiovascular: Negative for chest pain and palpitations.  Gastrointestinal: Positive for nausea. Negative for abdominal pain and vomiting.  Neurological: Positive for tremors. Negative for dizziness and weakness.    Social History   Tobacco Use  . Smoking status: Never Smoker  . Smokeless tobacco: Never Used  Substance Use Topics  . Alcohol use: No   Objective:   BP 102/70 (BP Location: Left Arm, Patient Position: Sitting, Cuff Size: Normal)   Pulse 75   Temp 98.4 F (36.9 C) (Oral)   Resp 16   Ht 5' (1.524 m)   Wt 144 lb (65.3 kg)   LMP 08/30/2003   SpO2 96% Comment: room air  BMI 28.12 kg/m  Vitals:   06/04/18 1634  BP: 102/70  Pulse: 75  Resp: 16  Temp: 98.4 F (36.9 C)  TempSrc: Oral  SpO2: 96%  Weight: 144 lb (65.3 kg)  Height: 5' (1.524 m)     Physical Exam   General Appearance:    Alert, cooperative, no distress  Eyes:    PERRL, conjunctiva/corneas clear, EOM's intact       Lungs:     Clear to auscultation bilaterally, respirations unlabored  Heart:    Regular rate and rhythm  Neurologic:   Awake, alert, oriented x 3. No apparent focal neurological           defect.       Results for orders placed or performed in visit on 06/04/18  POCT HgB A1C  Result Value Ref Range   Hemoglobin A1C 7.3 (A) 4.0 - 5.6 %   HbA1c POC (<> result, manual entry)     HbA1c, POC (prediabetic range)     HbA1c, POC (controlled diabetic range)     Est. average glucose Bld gHb  Est-mCnc 163        Assessment & Plan:     1. Type 2 diabetes mellitus without complication, without long-term current use of insulin (HCC) Well controlled.  Continue current medications.   - POCT HgB A1C  2. Essential hypertension She has been taking both the combo losartan/hctz and separate pills of losartan 50mg  and hctz 12.5. Advised that she is not supposed to be taking combo pill which she is going to discontinue. Shakiness is likely due to relative hypotension .   3. Hyperlipidemia, unspecified hyperlipidemia type She is tolerating simvastatin well with no adverse effects.   - Lipid panel  4. Need for influenza vaccination  - Flu vaccine HIGH DOSE PF (Fluzone High dose)  Return in about 6 months (around 12/03/2018).  Lelon Huh, MD  [facility]

## 2018-06-06 DIAGNOSIS — E785 Hyperlipidemia, unspecified: Secondary | ICD-10-CM | POA: Diagnosis not present

## 2018-06-07 ENCOUNTER — Telehealth: Payer: Self-pay

## 2018-06-07 ENCOUNTER — Encounter: Payer: Self-pay | Admitting: Family Medicine

## 2018-06-07 LAB — LIPID PANEL
CHOL/HDL RATIO: 2.2 ratio (ref 0.0–4.4)
Cholesterol, Total: 151 mg/dL (ref 100–199)
HDL: 68 mg/dL (ref >39)
LDL CALC: 64 mg/dL (ref 0–99)
Triglycerides: 93 mg/dL (ref 0–149)
VLDL CHOLESTEROL CAL: 19 mg/dL (ref 5–40)

## 2018-06-07 NOTE — Telephone Encounter (Signed)
-----   Message from Birdie Sons, MD sent at 06/07/2018  7:36 AM EST ----- Labs are good. Continue current medications.  Follow up for diabetes and BP in may as scheduled.

## 2018-06-07 NOTE — Telephone Encounter (Signed)
Pt advised.   Thanks,   -Jahari Billy  

## 2018-06-28 ENCOUNTER — Other Ambulatory Visit: Payer: Self-pay | Admitting: Family Medicine

## 2018-07-07 ENCOUNTER — Other Ambulatory Visit: Payer: Self-pay | Admitting: Family Medicine

## 2018-07-07 DIAGNOSIS — E119 Type 2 diabetes mellitus without complications: Secondary | ICD-10-CM

## 2018-08-16 ENCOUNTER — Other Ambulatory Visit: Payer: Self-pay | Admitting: Family Medicine

## 2018-08-22 ENCOUNTER — Other Ambulatory Visit: Payer: Self-pay | Admitting: Family Medicine

## 2018-08-22 NOTE — Telephone Encounter (Signed)
This is done. ° °Thanks,  ° °-Laura  °

## 2018-08-22 NOTE — Telephone Encounter (Signed)
Pt states she wants to make sure her pharmacy is changed [facility] [place].

## 2018-10-02 ENCOUNTER — Other Ambulatory Visit: Payer: Self-pay | Admitting: Family Medicine

## 2018-10-05 ENCOUNTER — Other Ambulatory Visit: Payer: Self-pay | Admitting: Internal Medicine

## 2018-10-05 DIAGNOSIS — C50811 Malignant neoplasm of overlapping sites of right female breast: Secondary | ICD-10-CM

## 2018-10-05 DIAGNOSIS — Z79811 Long term (current) use of aromatase inhibitors: Secondary | ICD-10-CM

## 2018-10-14 ENCOUNTER — Other Ambulatory Visit: Payer: Self-pay | Admitting: Family Medicine

## 2018-10-14 DIAGNOSIS — C50911 Malignant neoplasm of unspecified site of right female breast: Secondary | ICD-10-CM

## 2018-10-17 ENCOUNTER — Ambulatory Visit: Payer: Self-pay

## 2018-10-23 ENCOUNTER — Telehealth: Payer: Self-pay

## 2018-10-23 NOTE — Telephone Encounter (Signed)
AWV phone apt scheduled for 10/29/18 @ 10 AM. -MM

## 2018-10-23 NOTE — Telephone Encounter (Signed)
LMTCB to schedule AWV over the phone for sometime next week. Need to verify # to call when pt CB.  -MM

## 2018-10-28 ENCOUNTER — Telehealth: Payer: Self-pay

## 2018-10-28 NOTE — Telephone Encounter (Signed)
Called pt to change telephonic AWV to a virtual visit (per Medicare guidelines) and pt declined and stated she has to take care of her mother right now and does not have access to video chat. Pt states she will CB to schedule AWV later in the year. -MM

## 2018-10-29 ENCOUNTER — Ambulatory Visit: Payer: Self-pay

## 2018-11-29 ENCOUNTER — Other Ambulatory Visit: Payer: Self-pay | Admitting: Family Medicine

## 2018-12-03 ENCOUNTER — Ambulatory Visit: Payer: Self-pay | Admitting: Family Medicine

## 2018-12-06 ENCOUNTER — Other Ambulatory Visit: Payer: Self-pay

## 2018-12-06 ENCOUNTER — Ambulatory Visit (INDEPENDENT_AMBULATORY_CARE_PROVIDER_SITE_OTHER): Payer: Medicare HMO | Admitting: Family Medicine

## 2018-12-06 ENCOUNTER — Encounter: Payer: Self-pay | Admitting: Family Medicine

## 2018-12-06 VITALS — BP 118/74 | HR 80 | Temp 98.7°F | Resp 16 | Ht 60.0 in | Wt 149.0 lb

## 2018-12-06 DIAGNOSIS — I10 Essential (primary) hypertension: Secondary | ICD-10-CM | POA: Diagnosis not present

## 2018-12-06 DIAGNOSIS — E785 Hyperlipidemia, unspecified: Secondary | ICD-10-CM | POA: Diagnosis not present

## 2018-12-06 DIAGNOSIS — E119 Type 2 diabetes mellitus without complications: Secondary | ICD-10-CM | POA: Diagnosis not present

## 2018-12-06 LAB — POCT GLYCOSYLATED HEMOGLOBIN (HGB A1C)
Est. average glucose Bld gHb Est-mCnc: 166
Hemoglobin A1C: 7.4 % — AB (ref 4.0–5.6)

## 2018-12-06 NOTE — Patient Instructions (Addendum)
.   Please review the attached list of medications and notify my office if there are any errors.   . Please bring all of your medications to every appointment so we can make sure that our medication list is the same as yours.   . Please contact your eyecare professional to schedule a routine eye exam    

## 2018-12-06 NOTE — Progress Notes (Signed)
Patient: Hannah [name] Female    DOB: October 07, 1950   68 y.o.   MRN: [id number] Visit Date: 12/06/2018  Today's Provider: Lelon Huh, MD   Chief Complaint  Patient presents with  . Diabetes  . Hypertension  . Hyperlipidemia   Subjective:     HPI    Diabetes Mellitus Type II, Follow-up:   Lab Results  Component Value Date   HGBA1C 7.4 (A) 12/06/2018   HGBA1C 7.3 (A) 06/04/2018   HGBA1C 7.6 12/10/2017    Last seen for diabetes 6 months ago.  Management since then includes no changes. She reports good compliance with treatment. She is not having side effects.  Current symptoms include none and have been stable. Home blood sugar records: not being checked  Episodes of hypoglycemia? no   Current Insulin Regimen: none Most Recent Eye Exam: 1 year ago. Weight trend: stable Prior visit with dietician: No Current exercise: no regular exercise Current diet habits: well balanced  Pertinent Labs:    Component Value Date/Time   CHOL 151 06/06/2018 0955   TRIG 93 06/06/2018 0955   HDL 68 06/06/2018 0955   LDLCALC 64 06/06/2018 0955   LDLCALC 45 06/13/2017 0844   CREATININE 0.75 03/14/2018 0931   CREATININE 0.69 06/13/2017 0844    Wt Readings from Last 3 Encounters:  12/06/18 149 lb (67.6 kg)  06/04/18 144 lb (65.3 kg)  01/01/18 142 lb (64.4 kg)    Hypertension, follow-up:  BP Readings from Last 3 Encounters:  12/06/18 118/74  06/04/18 102/70  03/14/18 108/71    She was last seen for hypertension 6 months ago.  BP at that visit was 102/70. Management since that visit includes no changes. She reports good compliance with treatment. She is not having side effects.  She is not exercising. She is adherent to low salt diet.   Outside blood pressures are not being checked. She is experiencing none.  Patient denies exertional chest pressure/discomfort, lower extremity edema and palpitations.   Cardiovascular risk factors include diabetes mellitus and  dyslipidemia.    Lipid/Cholesterol, Follow-up:   Last seen for this6 months ago.  Management changes since that visit include no changes. . Last Lipid Panel:    Component Value Date/Time   CHOL 151 06/06/2018 0955   TRIG 93 06/06/2018 0955   HDL 68 06/06/2018 0955   CHOLHDL 2.2 06/06/2018 0955   CHOLHDL 1.8 06/13/2017 0844   VLDL 42.4 (H) 02/07/2016 1508   LDLCALC 64 06/06/2018 0955   LDLCALC 45 06/13/2017 0844   LDLDIRECT 74.0 02/07/2016 1508    Risk factors for vascular disease include diabetes mellitus and hypertension  She reports good compliance with treatment. She is not having side effects.  Current symptoms include none and have been stable.  Allergies  Allergen Reactions  . Azithromycin Swelling    Itching, swelling, and rash  . Codeine Other (See Comments)    Mental changes  . Penicillins Itching and Rash     Current Outpatient Medications:  .  anastrozole (ARIMIDEX) 1 MG tablet, TAKE 1 TABLET EVERY DAY, Disp: 90 tablet, Rfl: 1 .  carvedilol (COREG) 12.5 MG tablet, Take 1 tablet (12.5 mg total) by mouth 2 (two) times daily., Disp: 180 tablet, Rfl: 3 .  Cholecalciferol (VITAMIN D3) 1000 UNITS CAPS, Take 1 capsule by mouth daily., Disp: , Rfl:  .  FLUoxetine (PROZAC) 10 MG capsule, TAKE 3 CAPSULES BY MOUTH ONCE A DAY FOR ANXIETY, Disp: 270 capsule, Rfl: 4 .  fluticasone (FLONASE) 50 MCG/ACT nasal spray, fluticasone propionate as needed, Disp: , Rfl:  .  glipiZIDE (GLUCOTROL XL) 2.5 MG 24 hr tablet, TAKE 1 TABLET BY MOUTH ONCE DAILY FOR DIABETES, Disp: 90 tablet, Rfl: 4 .  hydrochlorothiazide (HYDRODIURIL) 12.5 MG tablet, TAKE 1 TABLET BY MOUTH EVERY DAY, Disp: 90 tablet, Rfl: 0 .  losartan (COZAAR) 50 MG tablet, Take 1 tablet (50 mg total) by mouth daily. PLEASE CANCEL LOSARTAN-HCTZ COMBO REFILLS, Disp: 90 tablet, Rfl: 3 .  metFORMIN (GLUCOPHAGE) 1000 MG tablet, TAKE 1 TABLET BY MOUTH TWICE A DAY FOR DIABETES, Disp: 180 tablet, Rfl: 4 .  simvastatin (ZOCOR) 40  MG tablet, TAKE 1 TABLET BY MOUTH EVERYDAY AT BEDTIME, Disp: 90 tablet, Rfl: 1  Review of Systems  Constitutional: Negative for activity change, appetite change, chills, fatigue, fever and unexpected weight change.  Respiratory: Negative for cough, shortness of breath and wheezing.   Cardiovascular: Negative for chest pain, palpitations and leg swelling.  Gastrointestinal: Negative.   Endocrine: Negative for cold intolerance, heat intolerance, polydipsia, polyphagia and polyuria.  Musculoskeletal: Negative for arthralgias, back pain, joint swelling and myalgias.  Allergic/Immunologic: Negative for environmental allergies.  Neurological: Negative for dizziness, light-headedness and headaches.  Psychiatric/Behavioral: Negative for agitation, self-injury, sleep disturbance and suicidal ideas. The patient is not nervous/anxious.     Social History   Tobacco Use  . Smoking status: Never Smoker  . Smokeless tobacco: Never Used  Substance Use Topics  . Alcohol use: No      Objective:   BP 118/74 (BP Location: Left Arm, Patient Position: Sitting, Cuff Size: Normal)   Pulse 80   Temp 98.7 F (37.1 C)   Resp 16   Ht 5' (1.524 m)   Wt 149 lb (67.6 kg)   LMP 08/30/2003   BMI 29.10 kg/m  Vitals:   12/06/18 1051  BP: 118/74  Pulse: 80  Resp: 16  Temp: 98.7 F (37.1 C)  Weight: 149 lb (67.6 kg)  Height: 5' (1.524 m)     Physical Exam   General Appearance:    Alert, cooperative, no distress  Eyes:    PERRL, conjunctiva/corneas clear, EOM's intact       Lungs:     Clear to auscultation bilaterally, respirations unlabored  Heart:    Regular rate and rhythm  Neurologic:   Awake, alert, oriented x 3. No apparent focal neurological           defect.       Results for orders placed or performed in visit on 12/06/18  POCT glycosylated hemoglobin (Hb A1C)  Result Value Ref Range   Hemoglobin A1C 7.4 (A) 4.0 - 5.6 %   HbA1c POC (<> result, manual entry)     HbA1c, POC  (prediabetic range)     HbA1c, POC (controlled diabetic range)     Est. average glucose Bld gHb Est-mCnc 166        Assessment & Plan    1. Type 2 diabetes mellitus without complication, without long-term current use of insulin (HCC) Well controlled.  Continue current medications.   - POCT glycosylated hemoglobin (Hb A1C)  2. Essential hypertension Well controlled.  Continue current medications.    3. Hyperlipidemia, unspecified hyperlipidemia type She is tolerating simvastatin well with no adverse effects.       Lelon Huh, MD  [facility]

## 2018-12-26 ENCOUNTER — Telehealth: Payer: Self-pay

## 2018-12-26 NOTE — Telephone Encounter (Signed)
[facility] and stated that the insurance is requesting a prior authorization to be done on patients Prozac 10 mg due to it been prescribed as patient taking 3 capsules a daily. FYI.

## 2018-12-27 MED ORDER — FLUOXETINE HCL 40 MG PO CAPS: 40.0000 mg | ORAL_CAPSULE | Freq: Every day | ORAL | 3 refills | Status: DC

## 2018-12-27 NOTE — Telephone Encounter (Signed)
Please contact patient and advise that insurance won't cover 3 pills a day. prozac comes in 20 and 40mg  pills, so we can either go up to a 40mg  pill every day, or down to a 20mg  pill every day, whichever she feels she would do better with.

## 2018-12-27 NOTE — Addendum Note (Signed)
Addended by: Lelon Huh E on: 12/27/2018 11:50 AM   Modules accepted: Orders

## 2018-12-27 NOTE — Telephone Encounter (Signed)
Pt advised.  She would like to go up to 40mg  a day.  Please send to [facility] [place].  Thanks,   -Mickel Baas

## 2019-01-14 ENCOUNTER — Other Ambulatory Visit: Payer: Self-pay

## 2019-01-14 ENCOUNTER — Ambulatory Visit
Admission: RE | Admit: 2019-01-14 | Discharge: 2019-01-14 | Disposition: A | Discharge status: Home / Self Care | Payer: Medicare HMO | Source: Ambulatory Visit | Attending: Internal Medicine | Admitting: Internal Medicine

## 2019-01-14 DIAGNOSIS — Z1231 Encounter for screening mammogram for malignant neoplasm of breast: Secondary | ICD-10-CM | POA: Diagnosis not present

## 2019-01-14 DIAGNOSIS — C50811 Malignant neoplasm of overlapping sites of right female breast: Secondary | ICD-10-CM

## 2019-03-03 ENCOUNTER — Other Ambulatory Visit: Payer: Self-pay | Admitting: Family Medicine

## 2019-03-14 ENCOUNTER — Other Ambulatory Visit: Payer: Self-pay

## 2019-03-14 DIAGNOSIS — C50811 Malignant neoplasm of overlapping sites of right female breast: Secondary | ICD-10-CM

## 2019-03-17 ENCOUNTER — Inpatient Hospital Stay: Payer: Medicare HMO | Attending: Internal Medicine

## 2019-03-17 ENCOUNTER — Other Ambulatory Visit: Payer: Self-pay

## 2019-03-17 ENCOUNTER — Encounter: Payer: Self-pay | Admitting: Internal Medicine

## 2019-03-17 ENCOUNTER — Inpatient Hospital Stay (HOSPITAL_BASED_OUTPATIENT_CLINIC_OR_DEPARTMENT_OTHER): Payer: Medicare HMO | Admitting: Internal Medicine

## 2019-03-17 DIAGNOSIS — C50811 Malignant neoplasm of overlapping sites of right female breast: Secondary | ICD-10-CM

## 2019-03-17 DIAGNOSIS — Z79811 Long term (current) use of aromatase inhibitors: Secondary | ICD-10-CM | POA: Insufficient documentation

## 2019-03-17 DIAGNOSIS — M1990 Unspecified osteoarthritis, unspecified site: Secondary | ICD-10-CM | POA: Insufficient documentation

## 2019-03-17 DIAGNOSIS — Z170 Estrogen receptor positive status [ER+]: Secondary | ICD-10-CM | POA: Insufficient documentation

## 2019-03-17 DIAGNOSIS — E119 Type 2 diabetes mellitus without complications: Secondary | ICD-10-CM | POA: Diagnosis not present

## 2019-03-17 DIAGNOSIS — D649 Anemia, unspecified: Secondary | ICD-10-CM | POA: Insufficient documentation

## 2019-03-17 LAB — CBC WITH DIFFERENTIAL/PLATELET
Abs Immature Granulocytes: 0.03 10*3/uL (ref 0.00–0.07)
Basophils Absolute: 0 10*3/uL (ref 0.0–0.1)
Basophils Relative: 1 %
Eosinophils Absolute: 0.2 10*3/uL (ref 0.0–0.5)
Eosinophils Relative: 3 %
HCT: 34.9 % — ABNORMAL LOW (ref 36.0–46.0)
Hemoglobin: 11.9 g/dL — ABNORMAL LOW (ref 12.0–15.0)
Immature Granulocytes: 0 %
Lymphocytes Relative: 28 %
Lymphs Abs: 1.9 10*3/uL (ref 0.7–4.0)
MCH: 30.7 pg (ref 26.0–34.0)
MCHC: 34.1 g/dL (ref 30.0–36.0)
MCV: 90.2 fL (ref 80.0–100.0)
Monocytes Absolute: 0.5 10*3/uL (ref 0.1–1.0)
Monocytes Relative: 7 %
Neutro Abs: 4.2 10*3/uL (ref 1.7–7.7)
Neutrophils Relative %: 61 %
Platelets: 402 10*3/uL — ABNORMAL HIGH (ref 150–400)
RBC: 3.87 MIL/uL (ref 3.87–5.11)
RDW: 12.2 % (ref 11.5–15.5)
WBC: 6.9 10*3/uL (ref 4.0–10.5)
nRBC: 0 % (ref 0.0–0.2)

## 2019-03-17 LAB — COMPREHENSIVE METABOLIC PANEL
ALT: 17 U/L (ref 0–44)
AST: 18 U/L (ref 15–41)
Albumin: 3.8 g/dL (ref 3.5–5.0)
Alkaline Phosphatase: 51 U/L (ref 38–126)
Anion gap: 10 (ref 5–15)
BUN: 20 mg/dL (ref 8–23)
CO2: 25 mmol/L (ref 22–32)
Calcium: 9.2 mg/dL (ref 8.9–10.3)
Chloride: 100 mmol/L (ref 98–111)
Creatinine, Ser: 0.86 mg/dL (ref 0.44–1.00)
GFR calc Af Amer: >60 mL/min (ref >60)
GFR calc non Af Amer: >60 mL/min (ref >60)
Glucose, Bld: 154 mg/dL — ABNORMAL HIGH (ref 70–99)
Potassium: 4.4 mmol/L (ref 3.5–5.1)
Sodium: 135 mmol/L (ref 135–145)
Total Bilirubin: 0.6 mg/dL (ref 0.3–1.2)
Total Protein: 6.7 g/dL (ref 6.5–8.1)

## 2019-03-17 LAB — IRON AND TIBC
Iron: 62 ug/dL (ref 28–170)
Saturation Ratios: 14 % (ref 10.4–31.8)
TIBC: 438 ug/dL (ref 250–450)
UIBC: 376 ug/dL

## 2019-03-17 LAB — FERRITIN: Ferritin: 13 ng/mL (ref 11–307)

## 2019-03-17 NOTE — Assessment & Plan Note (Addendum)
#   STAGE I Breast ca ER/PR-Pos; her 2 Neu NEG [high risk oncotype]. Patient Arimidex/extended duration.  Clinically stable.  No evidence of recurrence.  June 2020- mammogram normal. Will refill when needed.  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  #Fasting blood sugars 154-diabetes not well controlled.  Recommend follow-up with PCP.  # Mild anemia- Hb 11.9; cologuard  [pcp ? 1-2 years ago]; recommend checking Iron studies/ferritin. Declines colonoscopy. add iron studies/ferritin; UA/ stool card x3.   #BMD april 2019 normal. STABLE.  Continue calcium with vitamin D.  # DISPOSITION: add iron studies/ferritin; UA/ stool card x3.  # follow up in 12 months- MD/ labs- cbc/cmp- Dr.B.

## 2019-03-17 NOTE — Progress Notes (Signed)
[place] OFFICE PROGRESS NOTE  Patient Care Team: Birdie Sons, MD as PCP - General (Family Medicine) Bary Castilla, Forest Gleason, MD (General Surgery) Cammie Sickle, MD as Consulting Physician (Internal Medicine)  Cancer Staging No matching staging information was found for the patient.   Oncology History Overview Note  # AUG 201- T1b N0 M0 (clinical, stage I) infiltrating ductal carcinoma of the right breast status post wide local excision and sentinel node study March 14, 2010. Tumor size 9 mm, grade 2, margins negative.  2 sentinel lymph nodes negative.  ER positive (90%), PR borderline (5%) HER-2/neu 2+ on IHC, negative on FISH. Her2/CEP ratio is 1.45. April 11, 2010 - Oncotype DX score is 35 (high risk group) with average rate of distant recurrence of 24%. Got mammosite.  Patient completed adjuvant chemotherapy with Taxotere/Cytoxan, then radiation, then started aromatase inhibitor in February 2012. ---------------------------------------  DIAGNOSIS: RIGHT BREAST CA  STAGE:  I       ;GOALS: CURE  CURRENT/MOST RECENT THERAPY : Arimidex    Malignant neoplasm of overlapping sites of right breast ([place])      INTERVAL HISTORY:  Daily P Parkey 68 y.o.  female pleasant patient above history of breast cancer ER PR positive HER-2 negative currently on Arimidex is here for follow-up.  Patient denies any significant bone pain.  Complains to have chronic joint pains not any worse.  No new lumps or bumps.  Appetite is good.  No weight loss.  No shortness of the cough.  Denies any blood in stools or black or stools but no blood in urine.  Never had a colonoscopy.  States to have a Cologuard test 1 to 2 years ago.   Review of Systems  Constitutional: Negative for chills, diaphoresis, fever, malaise/fatigue and weight loss.  HENT: Negative for nosebleeds and sore throat.   Eyes: Negative for double vision.  Respiratory: Negative for cough, hemoptysis,  sputum production, shortness of breath and wheezing.   Cardiovascular: Negative for chest pain, palpitations, orthopnea and leg swelling.  Gastrointestinal: Negative for abdominal pain, blood in stool, constipation, diarrhea, heartburn, melena, nausea and vomiting.  Genitourinary: Negative for dysuria, frequency and urgency.  Musculoskeletal: Positive for back pain and joint pain.  Skin: Negative.  Negative for itching and rash.  Neurological: Negative for dizziness, tingling, focal weakness, weakness and headaches.  Endo/Heme/Allergies: Does not bruise/bleed easily.  Psychiatric/Behavioral: Negative for depression. The patient is not nervous/anxious and does not have insomnia.       PAST MEDICAL HISTORY :  Past Medical History:  Diagnosis Date  . Allergy   . Arthritis   . Cancer ([place]) 2011-Right    DCIS. 9 mm histologic grade 2 invasive mammary carcinoma, ER 90%, PR 5%, HER-2/neu not over expressed. Sentinel nodes negative.  . Chicken pox   . Diabetes mellitus without complication ([place])   . Fractured elbow   . History of lumpectomy 2011   Right breast  . Hyperlipidemia   . Hypertension 2001  . Personal history of chemotherapy 2011   Right Breast  . Personal history of radiation therapy 2011   Right breast  . Radial head fracture, closed 12/19/2016    PAST SURGICAL HISTORY :   Past Surgical History:  Procedure Laterality Date  . BREAST BIOPSY Right 2011   Invasive Ductal Carcinoma  . BREAST LUMPECTOMY Right 2011  . BREAST SURGERY Right 2011    right breast wide excision, partial breast radiation.  . COLONOSCOPY  Declines   Reports she  has annual stool fecal occult blood testing.  Hannah [name] DILATION AND CURETTAGE OF UTERUS  1990  . PORT-A-CATH REMOVAL  2012  . right breast wide excision  2011  . WRIST SURGERY  1998   pins placed    FAMILY HISTORY :   Family History  Problem Relation Age of Onset  . Arthritis Mother   . Alzheimer's disease Mother   . Arthritis Father   .  Heart disease Father   . Arthritis Maternal Grandmother   . Breast cancer Maternal Grandmother   . Alzheimer's disease Maternal Grandmother   . Stroke Maternal Grandfather     SOCIAL HISTORY:   Social History   Tobacco Use  . Smoking status: Never Smoker  . Smokeless tobacco: Never Used  Substance Use Topics  . Alcohol use: No  . Drug use: No    ALLERGIES:  is allergic to azithromycin; codeine; and penicillins.  MEDICATIONS:  Current Outpatient Medications  Medication Sig Dispense Refill  . anastrozole (ARIMIDEX) 1 MG tablet TAKE 1 TABLET EVERY DAY 90 tablet 1  . carvedilol (COREG) 12.5 MG tablet Take 1 tablet (12.5 mg total) by mouth 2 (two) times daily. 180 tablet 3  . Cholecalciferol (VITAMIN D3) 1000 UNITS CAPS Take 1 capsule by mouth daily.    Hannah [name] FLUoxetine (PROZAC) 40 MG capsule Take 1 capsule (40 mg total) by mouth daily. 90 capsule 3  . glipiZIDE (GLUCOTROL XL) 2.5 MG 24 hr tablet TAKE 1 TABLET BY MOUTH ONCE DAILY FOR DIABETES 90 tablet 4  . hydrochlorothiazide (HYDRODIURIL) 12.5 MG tablet TAKE 1 TABLET BY MOUTH EVERY DAY 90 tablet 1  . losartan (COZAAR) 50 MG tablet Take 1 tablet (50 mg total) by mouth daily. PLEASE CANCEL LOSARTAN-HCTZ COMBO REFILLS 90 tablet 3  . metFORMIN (GLUCOPHAGE) 1000 MG tablet TAKE 1 TABLET BY MOUTH TWICE A DAY FOR DIABETES 180 tablet 4  . simvastatin (ZOCOR) 40 MG tablet TAKE 1 TABLET BY MOUTH EVERYDAY AT BEDTIME 90 tablet 1  . fluticasone (FLONASE) 50 MCG/ACT nasal spray fluticasone propionate as needed     No current facility-administered medications for this visit.     PHYSICAL EXAMINATION: ECOG PERFORMANCE STATUS: 0 - Asymptomatic  LMP 08/30/2003   There were no vitals filed for this visit.  Physical Exam  Constitutional: She is oriented to person, place, and time and well-developed, well-nourished, and in no distress.  HENT:  Head: Normocephalic and atraumatic.  Mouth/Throat: Oropharynx is clear and moist. No oropharyngeal  exudate.  Eyes: Pupils are equal, round, and reactive to light.  Neck: Normal range of motion. Neck supple.  Cardiovascular: Normal rate and regular rhythm.  Pulmonary/Chest: No respiratory distress. She has no wheezes.  Abdominal: Soft. Bowel sounds are normal. She exhibits no distension and no mass. There is no abdominal tenderness. There is no rebound and no guarding.  Musculoskeletal: Normal range of motion.        General: No tenderness or edema.  Neurological: She is alert and oriented to person, place, and time.  Skin: Skin is warm.  Psychiatric: Affect normal.       LABORATORY DATA:  I have reviewed the data as listed    Component Value Date/Time   NA 135 03/17/2019 0920   K 4.4 03/17/2019 0920   CL 100 03/17/2019 0920   CO2 25 03/17/2019 0920   GLUCOSE 154 (H) 03/17/2019 0920   BUN 20 03/17/2019 0920   CREATININE 0.86 03/17/2019 0920   CREATININE 0.69 06/13/2017 0844   CALCIUM 9.2  03/17/2019 0920   CALCIUM 9.9 02/10/2014 1458   PROT 6.7 03/17/2019 0920   PROT 7.4 02/10/2014 1458   ALBUMIN 3.8 03/17/2019 0920   ALBUMIN 3.8 02/10/2014 1458   AST 18 03/17/2019 0920   AST 17 02/10/2014 1458   ALT 17 03/17/2019 0920   ALT 34 02/10/2014 1458   ALKPHOS 51 03/17/2019 0920   ALKPHOS 69 02/10/2014 1458   BILITOT 0.6 03/17/2019 0920   BILITOT 0.5 02/10/2014 1458   GFRNONAA >60 03/17/2019 0920   GFRNONAA 91 06/13/2017 0844   GFRAA >60 03/17/2019 0920   GFRAA 105 06/13/2017 0844    No results found for: SPEP, UPEP  Lab Results  Component Value Date   WBC 6.9 03/17/2019   NEUTROABS 4.2 03/17/2019   HGB 11.9 (L) 03/17/2019   HCT 34.9 (L) 03/17/2019   MCV 90.2 03/17/2019   PLT 402 (H) 03/17/2019      Chemistry      Component Value Date/Time   NA 135 03/17/2019 0920   K 4.4 03/17/2019 0920   CL 100 03/17/2019 0920   CO2 25 03/17/2019 0920   BUN 20 03/17/2019 0920   CREATININE 0.86 03/17/2019 0920   CREATININE 0.69 06/13/2017 0844      Component Value  Date/Time   CALCIUM 9.2 03/17/2019 0920   CALCIUM 9.9 02/10/2014 1458   ALKPHOS 51 03/17/2019 0920   ALKPHOS 69 02/10/2014 1458   AST 18 03/17/2019 0920   AST 17 02/10/2014 1458   ALT 17 03/17/2019 0920   ALT 34 02/10/2014 1458   BILITOT 0.6 03/17/2019 0920   BILITOT 0.5 02/10/2014 1458       RADIOGRAPHIC STUDIES: I have personally reviewed the radiological images as listed and agreed with the findings in the report. No results found.   ASSESSMENT & PLAN:  Malignant neoplasm of overlapping sites of right breast ([place]) # STAGE I Breast ca ER/PR-Pos; her 2 Neu NEG [high risk oncotype]. Patient Arimidex/extended duration.  Clinically stable.  No evidence of recurrence.  June 2020- mammogram normal. Will refill when needed.  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  #Fasting blood sugars 154-diabetes not well controlled.  Recommend follow-up with PCP.  # Mild anemia- Hb 11.9; cologuard  [pcp ? 1-2 years ago]; recommend checking Iron studies/ferritin. Declines colonoscopy. add iron studies/ferritin; UA/ stool card x3.   #BMD april 2019 normal. STABLE.  Continue calcium with vitamin D.  # DISPOSITION: add iron studies/ferritin; UA/ stool card x3.  # follow up in 12 months- MD/ labs- cbc/cmp- Dr.B.    No orders of the defined types were placed in this encounter.  All questions were answered. The patient knows to call the clinic with any problems, questions or concerns.      Cammie Sickle, MD 03/17/2019 10:33 AM

## 2019-03-17 NOTE — Progress Notes (Signed)
Patient does not offer any problems today.  

## 2019-03-18 LAB — CANCER ANTIGEN 27.29: CA 27.29: <3.5 U/mL (ref 0.0–38.6)

## 2019-03-20 NOTE — Progress Notes (Signed)
Subjective:   Hannah [name] is a 68 y.o. female who presents for Medicare Annual (Subsequent) preventive examination.    This visit is being conducted through telemedicine due to the COVID-19 pandemic. This patient has given me verbal consent via doximity to conduct this visit, patient states they are participating from their home address. Some vital signs may be absent or patient reported.    Patient identification: identified by name, DOB, and current address  Review of Systems:  N/A  Cardiac Risk Factors include: advanced age (>60mn, >>44women);diabetes mellitus;hypertension;dyslipidemia     Objective:     Vitals: LMP 08/30/2003   There is no height or weight on file to calculate BMI. Unable to obtain vitals due to visit being conducted via telephonically.   Advanced Directives 03/24/2019 03/17/2019 10/15/2017 03/14/2017 03/02/2016 03/02/2016 02/12/2015  Does Patient Have a Medical Advance Directive? No No Yes No No No No  Type of Advance Directive - -Public librarianLiving will - - - -  Copy of [place] Chart? - - No - copy requested - - - -  Would patient like information on creating a medical advance directive? No - Patient declined No - Patient declined - No - Patient declined - - No - patient declined information    Tobacco Social History   Tobacco Use  Smoking Status Never Smoker  Smokeless Tobacco Never Used     Counseling given: Not Answered   Clinical Intake:  Pre-visit preparation completed: Yes  Hannah : No/denies Hannah Hannah [name]: 0-No Hannah     Nutritional Risks: None Diabetes: Yes  How often do you need to have someone help you when you read instructions, pamphlets, or other written materials from your doctor or pharmacy?: 1 - Never   Diabetes:  Is the patient diabetic?  Yes  type 2 If diabetic, was a CBG obtained today?  No  Did the patient bring in their glucometer from home?  No  How often do you monitor your  CBG's? Once a month.   Financial Strains and Diabetes Management:  Are you having any financial strains with the device, your supplies or your medication? No .  Does the patient want to be seen by Chronic Care Management for management of their diabetes?  No  Would the patient like to be referred to a Nutritionist or for Diabetic Management?  No   Diabetic Exams:  Diabetic Eye Exam: Completed 06/20/17. Overdue for diabetic eye exam. Pt has been advised about the importance in completing this exam.   Diabetic Foot Exam: Currently due. Pt has been advised about the importance in completing this exam. Note made to follow up on this at next in office visit.    Interpreter Needed?: No  Information [facility] by :: [facility] L[facility]  Past Medical History:  Diagnosis Date  . Allergy   . Arthritis   . Cancer ([place] 2011-Right    DCIS. 9 mm histologic grade 2 invasive mammary carcinoma, ER 90%, PR 5%, HER-2/neu not over expressed. Sentinel nodes negative.  . Chicken pox   . Diabetes mellitus [place] complication ([place]   . Fractured elbow   . History of lumpectomy 2011   Right breast  . Hyperlipidemia   . Hypertension 2001  . Personal history of chemotherapy 2011   Right Breast  . Personal history of radiation therapy 2011   Right breast  . Radial head fracture, closed 12/19/2016   Past Surgical History:  Procedure Laterality Date  .  BREAST BIOPSY Right 2011   Invasive Ductal Carcinoma  . BREAST LUMPECTOMY Right 2011  . BREAST SURGERY Right 2011    right breast wide excision, partial breast radiation.  . COLONOSCOPY  Declines   Reports she has annual stool fecal occult blood testing.  [name] DILATION AND CURETTAGE OF UTERUS  1990  . PORT-A-CATH REMOVAL  2012  . right breast wide excision  2011  . WRIST SURGERY  1998   pins placed   Family History  Problem Relation Age of Onset  . Arthritis Mother   . Alzheimer's disease Mother   . Arthritis Father   . Heart disease Father   .  Arthritis Maternal Grandmother   . Breast cancer Maternal Grandmother   . Alzheimer's disease Maternal Grandmother   . Stroke Maternal Grandfather    Social History   Socioeconomic History  . Marital status: Widowed    Spouse name: Not on file  . Number of children: 1  . Years of education: Not on file  . Highest education level: Bachelor's degree (e.g., BA, AB, BS)  Occupational History  . Occupation: retired  Scientific laboratory technician  . Financial resource strain: Not hard at all  . Food insecurity    Worry: Never true    Inability: Never true  . Transportation needs    Medical: No    Non-medical: No  Tobacco Use  . Smoking status: Never Smoker  . Smokeless tobacco: Never Used  Substance and Sexual Activity  . Alcohol use: No  . Drug use: No  . Sexual activity: Not on file  Lifestyle  . Physical activity    Days per week: 0 days    Minutes per session: 0 min  . Stress: Not at all  Relationships  . Social Herbalist on phone: Patient refused    Gets together: Patient refused    Attends religious service: Patient refused    Active member of club or organization: Patient refused    Attends meetings of clubs or organizations: Patient refused    Relationship status: Patient refused  Other Topics Concern  . Not on file  Social History Narrative  . Not on file    Outpatient Encounter Medications as of 03/24/2019  Medication Sig  . anastrozole (ARIMIDEX) 1 MG tablet TAKE 1 TABLET EVERY DAY  . carvedilol (COREG) 12.5 MG tablet Take 1 tablet (12.5 mg total) by mouth 2 (two) times daily.  . Cholecalciferol (VITAMIN D3) 1000 UNITS CAPS Take 1 capsule by mouth daily.  [name] FLUoxetine (PROZAC) 40 MG capsule Take 1 capsule (40 mg total) by mouth daily.  . fluticasone (FLONASE) 50 MCG/ACT nasal spray fluticasone propionate as needed  . glipiZIDE (GLUCOTROL XL) 2.5 MG 24 hr tablet TAKE 1 TABLET BY MOUTH ONCE DAILY FOR DIABETES  . hydrochlorothiazide (HYDRODIURIL) 12.5 MG tablet  TAKE 1 TABLET BY MOUTH EVERY DAY  . losartan (COZAAR) 50 MG tablet Take 1 tablet (50 mg total) by mouth daily. PLEASE CANCEL LOSARTAN-HCTZ COMBO REFILLS  . metFORMIN (GLUCOPHAGE) 1000 MG tablet TAKE 1 TABLET BY MOUTH TWICE A DAY FOR DIABETES  . simvastatin (ZOCOR) 40 MG tablet TAKE 1 TABLET BY MOUTH EVERYDAY AT BEDTIME   No facility-administered encounter medications on file as of 03/24/2019.     Activities of Daily Living In your present state of health, do you have any difficulty performing the following activities: 03/24/2019  Hearing? N  Vision? N  Difficulty concentrating or making decisions? N  Walking or climbing stairs?  N  Dressing or bathing? N  Doing errands, shopping? N  Preparing Food and eating ? N  Using the Toilet? N  In the past six months, have you accidently leaked urine? N  Do you have problems with loss of bowel control? N  Managing your Medications? N  Managing your Finances? N  Housekeeping or managing your Housekeeping? N  Some recent data might be hidden    Patient Care Team: Birdie Sons, MD as PCP - General (Family Medicine) Cammie Sickle, MD as Consulting Physician (Internal Medicine)    Assessment:   This is a routine wellness examination for Hannah [name].  Exercise Activities and Dietary recommendations Current Exercise Habits: The patient does not participate in regular exercise at present, Exercise limited by: None identified  Goals    . Exercise 3x per week (30 min per time)     Recommend to start exercising 3 days a week for 30 minutes. Pt to start at [facility] doing yoga.        Fall Risk: Fall Risk  03/24/2019 10/15/2017 06/11/2017  Falls in the past year? 0 Yes Yes  Number falls in past yr: - 1 1  Comment - tripped -  Injury with Fall? - Yes Yes  Comment - fractured elbow -  Follow up - Falls prevention discussed Falls prevention discussed    FALL RISK PREVENTION PERTAINING TO THE HOME:  Any stairs in or around the home? Yes   If so, are there any without handrails? No   Home free of loose throw rugs in walkways, pet beds, electrical cords, etc? Yes  Adequate lighting in your home to reduce risk of falls? Yes   ASSISTIVE DEVICES UTILIZED TO PREVENT FALLS:  Life alert? No  Use of a cane, walker or w/c? No  Grab bars in the bathroom? Yes  Shower chair or bench in shower? Yes  Elevated toilet seat or a handicapped toilet? Yes   TIMED UP AND GO:  Was the test performed? No .    Depression Screen PHQ 2/9 Scores 03/24/2019 12/06/2018 10/15/2017 06/11/2017  PHQ - 2 [name] 0 0 2 1  PHQ- 9 [name] - - 4 5     Cognitive Function     6CIT Screen 03/24/2019 10/15/2017  What Year? 0 points 4 points  What month? 0 points 0 points  What time? 0 points 0 points  Count back from 20 0 points 0 points  Months in reverse 0 points 0 points  Repeat phrase 2 points 2 points  Total [name] 2 6    Immunization History  Administered Date(s) Administered  . Influenza, High Dose Seasonal PF 06/11/2017, 06/04/2018  . Pneumococcal Conjugate-13 02/07/2016  . Pneumococcal Polysaccharide-23 06/11/2017  . Tdap 12/04/2011  . Zoster 09/11/2013    Qualifies for Shingles Vaccine? Yes  Zostavax completed 09/11/13. Due for Shingrix. Education has been provided regarding the importance of this vaccine. Pt has been advised to call insurance company to determine out of pocket expense. Advised may also receive vaccine at local pharmacy or Health Dept. Verbalized acceptance and understanding.  Tdap: Up to date  Flu Vaccine: Due fall 2020  Pneumococcal Vaccine: Completed series   Screening Tests Health Maintenance  Topic Date Due  . Hepatitis C Screening  April 21, 1951  . FOOT EXAM  09/26/1960  . OPHTHALMOLOGY EXAM  06/20/2018  . INFLUENZA VACCINE  03/01/2019  . HEMOGLOBIN A1C  06/08/2019  . Fecal DNA (Cologuard)  04/05/2020  . MAMMOGRAM  01/13/2021  .  TETANUS/TDAP  12/03/2021  . DEXA SCAN  Completed  . PNA vac Low Risk Adult   Completed    Cancer Screenings:  Colorectal Screening: Cologuard completed 04/05/17. Repeat every 3 years.  Mammogram: Completed 01/14/19.   Bone Density: Completed 11/20/17. Results reflect NORMAL. No repeat needed unless indicated by a physician.   Lung Cancer Screening: (Low Dose CT Chest recommended if Age 49-80 years, 30 pack-year currently smoking OR have quit w/in 15years.) does not qualify.   Additional Screening:  Hepatitis C Screening: does qualify and would like to receive this at next in office visit.   Dental Screening: Recommended annual dental exams for proper oral hygiene   Community Resource Referral:  CRR required this visit?  No       Plan:  I have personally reviewed and addressed the Medicare Annual Wellness questionnaire and have noted the following in the patient's chart:  A. Medical and social history B. Use of alcohol, tobacco or illicit drugs  C. Current medications and supplements D. Functional ability and status E.  Nutritional status F.  Physical activity G. Advance directives H. List of other physicians I.  Hospitalizations, surgeries, and ER visits in previous 12 months J.  [place] such as hearing and vision if needed, cognitive and depression L. Referrals and appointments   In addition, I have reviewed and discussed with patient certain preventive protocols, quality metrics, and best practice recommendations. A written personalized care plan for preventive services as well as general preventive health recommendations were provided to patient.   Hannah [name], [place]  8/87/1959 Nurse Health Advisor   Nurse Notes: Pt needs a diabetic foot exam and Hep C lab order at next in office visit. Pt plans to set up an eye exam this year.

## 2019-03-21 ENCOUNTER — Other Ambulatory Visit: Payer: Self-pay

## 2019-03-21 DIAGNOSIS — M1990 Unspecified osteoarthritis, unspecified site: Secondary | ICD-10-CM | POA: Diagnosis not present

## 2019-03-21 DIAGNOSIS — D649 Anemia, unspecified: Secondary | ICD-10-CM

## 2019-03-21 DIAGNOSIS — Z170 Estrogen receptor positive status [ER+]: Secondary | ICD-10-CM | POA: Diagnosis not present

## 2019-03-21 DIAGNOSIS — C50811 Malignant neoplasm of overlapping sites of right female breast: Secondary | ICD-10-CM | POA: Diagnosis not present

## 2019-03-21 DIAGNOSIS — E119 Type 2 diabetes mellitus without complications: Secondary | ICD-10-CM | POA: Diagnosis not present

## 2019-03-21 DIAGNOSIS — Z79811 Long term (current) use of aromatase inhibitors: Secondary | ICD-10-CM | POA: Diagnosis not present

## 2019-03-21 LAB — URINALYSIS, COMPLETE (UACMP) WITH MICROSCOPIC
Bacteria, UA: NONE SEEN
Bilirubin Urine: NEGATIVE
Glucose, UA: >=500 mg/dL — AB
Hgb urine dipstick: NEGATIVE
Ketones, ur: NEGATIVE mg/dL
Nitrite: NEGATIVE
Protein, ur: NEGATIVE mg/dL
Specific Gravity, Urine: 1.011 (ref 1.005–1.030)
pH: 6 (ref 5.0–8.0)

## 2019-03-21 LAB — OCCULT BLOOD X 1 CARD TO LAB, STOOL: Fecal Occult Bld: NEGATIVE

## 2019-03-23 ENCOUNTER — Telehealth: Payer: Self-pay | Admitting: Internal Medicine

## 2019-03-23 NOTE — Telephone Encounter (Signed)
FYI-  Left a message for the patient to discuss mild anemia; left a voicemail to call us back patient will likely need GI work-up/follow-up CBC.

## 2019-03-24 ENCOUNTER — Other Ambulatory Visit: Payer: Self-pay

## 2019-03-24 ENCOUNTER — Ambulatory Visit (INDEPENDENT_AMBULATORY_CARE_PROVIDER_SITE_OTHER): Payer: Medicare HMO

## 2019-03-24 DIAGNOSIS — Z0000 Encounter for general adult medical examination without abnormal findings: Secondary | ICD-10-CM

## 2019-03-24 NOTE — Patient Instructions (Signed)
Hannah [name] , Thank you for taking time to come for your Medicare Wellness Visit. I appreciate your ongoing commitment to your health goals. Please review the following plan we discussed and let me know if I can assist you in the future.   Screening recommendations/referrals: Colonoscopy: Cologuard up to date, due 04/05/2020 Mammogram: Up to date, due 12/2028 Bone Density: Up to date, previous DEXA was normal. No repeat needed unless indicated by the physician.  Recommended yearly ophthalmology/optometry visit for glaucoma screening and checkup Recommended yearly dental visit for hygiene and checkup  Vaccinations: Influenza vaccine: Currently due. Pneumococcal vaccine: Completed series Tdap vaccine: Up to date, due 11/2021 Shingles vaccine: Pt declines today.     Advanced directives: Advance directive discussed with you today. Even though you declined this today please call our office should you change your mind and we can give you the proper paperwork for you to fill out.  Conditions/risks identified: Continue to work towards a form of exercise for 30 minutes 3 days a week.   Next appointment: 06/10/19 with Dr Caryn Section.    Preventive Care 51 Years and Older, Female Preventive care refers to lifestyle choices and visits with your health care provider that can promote health and wellness. What does preventive care include?  A yearly physical exam. This is also called an annual well check.  Dental exams once or twice a year.  Routine eye exams. Ask your health care provider how often you should have your eyes checked.  Personal lifestyle choices, including:  Daily care of your teeth and gums.  Regular physical activity.  Eating a healthy diet.  Avoiding tobacco and drug use.  Limiting alcohol use.  Practicing safe sex.  Taking low-dose aspirin every day.  Taking vitamin and mineral supplements as recommended by your health care provider. What happens during an annual well  check? The services and screenings done by your health care provider during your annual well check will depend on your age, overall health, lifestyle risk factors, and family history of disease. Counseling  Your health care provider may ask you questions about your:  Alcohol use.  Tobacco use.  Drug use.  Emotional well-being.  Home and relationship well-being.  Sexual activity.  Eating habits.  History of falls.  Memory and ability to understand (cognition).  Work and work Statistician.  Reproductive health. Screening  You may have the following tests or measurements:  Height, weight, and BMI.  Blood pressure.  Lipid and cholesterol levels. These may be checked every 5 years, or more frequently if you are over 12 years old.  Skin check.  Lung cancer screening. You may have this screening every year starting at age 69 if you have a 30-pack-year history of smoking and currently smoke or have quit within the past 15 years.  Fecal occult blood test (FOBT) of the stool. You may have this test every year starting at age 33.  Flexible sigmoidoscopy or colonoscopy. You may have a sigmoidoscopy every 5 years or a colonoscopy every 10 years starting at age 27.  Hepatitis C blood test.  Hepatitis B blood test.  Sexually transmitted disease (STD) testing.  Diabetes screening. This is done by checking your blood sugar (glucose) after you have not eaten for a while (fasting). You may have this done every 1-3 years.  Bone density scan. This is done to screen for osteoporosis. You may have this done starting at age 49.  Mammogram. This may be done every 1-2 years. Talk to your health  care provider about how often you should have regular mammograms. Talk with your health care provider about your test results, treatment options, and if necessary, the need for more tests. Vaccines  Your health care provider may recommend certain vaccines, such as:  Influenza vaccine. This is  recommended every year.  Tetanus, diphtheria, and acellular pertussis (Tdap, Td) vaccine. You may need a Td booster every 10 years.  Zoster vaccine. You may need this after age 22.  Pneumococcal 13-valent conjugate (PCV13) vaccine. One dose is recommended after age 10.  Pneumococcal polysaccharide (PPSV23) vaccine. One dose is recommended after age 13. Talk to your health care provider about which screenings and vaccines you need and how often you need them. This information is not intended to replace advice given to you by your health care provider. Make sure you discuss any questions you have with your health care provider. Document Released: 08/13/2015 Document Revised: 04/05/2016 Document Reviewed: 05/18/2015 Elsevier Interactive Patient Education  2017 [place] Prevention in the Home Falls can cause injuries. They can happen to people of all ages. There are many things you can do to make your home safe and to help prevent falls. What can I do on the outside of my home?  Regularly fix the edges of walkways and driveways and fix any cracks.  Remove anything that might make you trip as you walk through a door, such as a raised step or threshold.  Trim any bushes or trees on the path to your home.  Use bright outdoor lighting.  Clear any walking paths of anything that might make someone trip, such as rocks or tools.  Regularly check to see if handrails are loose or broken. Make sure that both sides of any steps have handrails.  Any raised decks and porches should have guardrails on the edges.  Have any leaves, snow, or ice cleared regularly.  Use sand or salt on walking paths during winter.  Clean up any spills in your garage right away. This includes oil or grease spills. What can I do in the bathroom?  Use night lights.  Install grab bars by the toilet and in the tub and shower. Do not use towel bars as grab bars.  Use non-skid mats or decals in the tub or  shower.  If you need to sit down in the shower, use a plastic, non-slip stool.  Keep the floor dry. Clean up any water that spills on the floor as soon as it happens.  Remove soap buildup in the tub or shower regularly.  Attach bath mats securely with double-sided non-slip rug tape.  Do not have throw rugs and other things on the floor that can make you trip. What can I do in the bedroom?  Use night lights.  Make sure that you have a light by your bed that is easy to reach.  Do not use any sheets or blankets that are too big for your bed. They should not hang down onto the floor.  Have a firm chair that has side arms. You can use this for support while you get dressed.  Do not have throw rugs and other things on the floor that can make you trip. What can I do in the kitchen?  Clean up any spills right away.  Avoid walking on wet floors.  Keep items that you use a lot in easy-to-reach places.  If you need to reach something above you, use a strong step stool that has a grab  bar.  Keep electrical cords out of the way.  Do not use floor polish or wax that makes floors slippery. If you must use wax, use non-skid floor wax.  Do not have throw rugs and other things on the floor that can make you trip. What can I do with my stairs?  Do not leave any items on the stairs.  Make sure that there are handrails on both sides of the stairs and use them. Fix handrails that are broken or loose. Make sure that handrails are as long as the stairways.  Check any carpeting to make sure that it is firmly attached to the stairs. Fix any carpet that is loose or worn.  Avoid having throw rugs at the top or bottom of the stairs. If you do have throw rugs, attach them to the floor with carpet tape.  Make sure that you have a light switch at the top of the stairs and the bottom of the stairs. If you do not have them, ask someone to add them for you. What else can I do to help prevent falls?   Wear shoes that:  Do not have high heels.  Have rubber bottoms.  Are comfortable and fit you well.  Are closed at the toe. Do not wear sandals.  If you use a stepladder:  Make sure that it is fully opened. Do not climb a closed stepladder.  Make sure that both sides of the stepladder are locked into place.  Ask someone to hold it for you, if possible.  Clearly mark and make sure that you can see:  Any grab bars or handrails.  First and last steps.  Where the edge of each step is.  Use tools that help you move around (mobility aids) if they are needed. These include:  Canes.  Walkers.  Scooters.  Crutches.  Turn on the lights when you go into a dark area. Replace any light bulbs as soon as they burn out.  Set up your furniture so you have a clear path. Avoid moving your furniture around.  If any of your floors are uneven, fix them.  If there are any pets around you, be aware of where they are.  Review your medicines with your doctor. Some medicines can make you feel dizzy. This can increase your chance of falling. Ask your doctor what other things that you can do to help prevent falls. This information is not intended to replace advice given to you by your health care provider. Make sure you discuss any questions you have with your health care provider. Document Released: 05/13/2009 Document Revised: 12/23/2015 Document Reviewed: 08/21/2014 Elsevier Interactive Patient Education  2017 [employer].

## 2019-03-25 ENCOUNTER — Other Ambulatory Visit: Payer: Self-pay | Admitting: *Deleted

## 2019-03-25 ENCOUNTER — Telehealth: Payer: Self-pay | Admitting: Internal Medicine

## 2019-03-25 DIAGNOSIS — C50811 Malignant neoplasm of overlapping sites of right female breast: Secondary | ICD-10-CM

## 2019-03-25 NOTE — Telephone Encounter (Signed)
Spoke to patient regarding results of slightly low hemoglobin 11.9.  No obvious evidence of iron deficiency noted  Patient has not had colonoscopies; had Cologuard testing in the past.  Recommend GI evaluation-patient reluctant.  Discussed option of repeating the labs in a month/patient agreement.   Please order/schedule lab-CBC/LDH [id number] folic acid in 1 month.

## 2019-03-25 NOTE — Addendum Note (Signed)
Addended by: Sandria Bales B on: 03/25/2019 08:51 AM   Modules accepted: Orders

## 2019-04-06 ENCOUNTER — Other Ambulatory Visit: Payer: Self-pay | Admitting: Internal Medicine

## 2019-04-06 ENCOUNTER — Other Ambulatory Visit: Payer: Self-pay | Admitting: Family Medicine

## 2019-04-06 DIAGNOSIS — Z79811 Long term (current) use of aromatase inhibitors: Secondary | ICD-10-CM

## 2019-04-06 DIAGNOSIS — C50811 Malignant neoplasm of overlapping sites of right female breast: Secondary | ICD-10-CM

## 2019-04-29 ENCOUNTER — Inpatient Hospital Stay: Payer: Medicare HMO | Attending: Internal Medicine

## 2019-05-15 ENCOUNTER — Other Ambulatory Visit: Payer: Self-pay

## 2019-05-15 DIAGNOSIS — Z20828 Contact with and (suspected) exposure to other viral communicable diseases: Secondary | ICD-10-CM | POA: Diagnosis not present

## 2019-05-15 DIAGNOSIS — Z20822 Contact with and (suspected) exposure to covid-19: Secondary | ICD-10-CM

## 2019-05-17 LAB — NOVEL CORONAVIRUS, NAA: SARS-CoV-2, NAA: NOT DETECTED

## 2019-05-22 DIAGNOSIS — R69 Illness, unspecified: Secondary | ICD-10-CM | POA: Diagnosis not present

## 2019-06-10 ENCOUNTER — Ambulatory Visit: Payer: Self-pay | Admitting: Family Medicine

## 2019-06-16 ENCOUNTER — Ambulatory Visit: Payer: Self-pay | Admitting: Family Medicine

## 2019-06-26 ENCOUNTER — Other Ambulatory Visit: Payer: Self-pay | Admitting: Family Medicine

## 2019-07-08 ENCOUNTER — Other Ambulatory Visit: Payer: Self-pay | Admitting: Family Medicine

## 2019-07-14 DIAGNOSIS — E119 Type 2 diabetes mellitus without complications: Secondary | ICD-10-CM | POA: Diagnosis not present

## 2019-07-14 LAB — HM DIABETES EYE EXAM

## 2019-07-15 ENCOUNTER — Other Ambulatory Visit: Payer: Self-pay

## 2019-07-15 ENCOUNTER — Encounter: Payer: Self-pay | Admitting: Family Medicine

## 2019-07-15 ENCOUNTER — Ambulatory Visit (INDEPENDENT_AMBULATORY_CARE_PROVIDER_SITE_OTHER): Payer: Medicare HMO | Admitting: Family Medicine

## 2019-07-15 VITALS — BP 125/77 | HR 88 | Temp 96.0°F | Resp 16 | Wt 150.4 lb

## 2019-07-15 DIAGNOSIS — E119 Type 2 diabetes mellitus without complications: Secondary | ICD-10-CM | POA: Diagnosis not present

## 2019-07-15 DIAGNOSIS — K219 Gastro-esophageal reflux disease without esophagitis: Secondary | ICD-10-CM | POA: Diagnosis not present

## 2019-07-15 DIAGNOSIS — I10 Essential (primary) hypertension: Secondary | ICD-10-CM

## 2019-07-15 LAB — POCT GLYCOSYLATED HEMOGLOBIN (HGB A1C): Hemoglobin A1C: 7 % — AB (ref 4.0–5.6)

## 2019-07-15 MED ORDER — OMEPRAZOLE 20 MG PO CPDR: 20.0000 mg | DELAYED_RELEASE_CAPSULE | Freq: Every day | ORAL | 3 refills | Status: DC

## 2019-07-15 NOTE — Progress Notes (Signed)
Patient: Hannah [name] Female    DOB: May 02, 1951   68 y.o.   MRN: [id number] Visit Date: 07/15/2019  Today's Provider: Lelon Huh, MD   Chief Complaint  Patient presents with  . Diabetes  . Hypertension  . Hyperlipidemia   Subjective:     HPI  Diabetes Mellitus Type II, Follow-up:   Lab Results  Component Value Date   HGBA1C 7.0 (A) 07/15/2019   HGBA1C 7.4 (A) 12/06/2018   HGBA1C 7.3 (A) 06/04/2018   Last seen for diabetes 7 months ago.  Management since then includes no changes. She reports excellent compliance with treatment. She is not having side effects.  Current symptoms include nausea and visual disturbances and have been unchanged. Home blood sugar records: not being checked  Episodes of hypoglycemia? no   Current Insulin Regimen: not on insulin Most Recent Eye Exam: >1 year ago Weight trend: stable Prior visit with dietician: no Current diet: in general, an "unhealthy" diet Current exercise: none  ------------------------------------------------------------------------   Hypertension, follow-up:  BP Readings from Last 3 Encounters:  07/15/19 125/77  12/06/18 118/74  06/04/18 102/70    She was last seen for hypertension 7 months ago.  BP at that visit was 118/74. Management since that visit includes no changes.She reports excellent compliance with treatment. She is not having side effects.  She is not exercising. She is not adherent to low salt diet.   Outside blood pressures are not being checked . She is experiencing none.  Patient denies chest pain, chest pressure/discomfort, exertional chest pressure/discomfort, fatigue, irregular heart beat, lower extremity edema, near-syncope, palpitations and syncope.   Cardiovascular risk factors include advanced age (older than 64 for men, 79 for women), diabetes mellitus, dyslipidemia and hypertension.  Use of agents associated with hypertension: none.    ------------------------------------------------------------------------    Lipid/Cholesterol, Follow-up:   Last seen for this 7 months ago.  Management since that visit includes no changes.  Last Lipid Panel:    Component Value Date/Time   CHOL 151 06/06/2018 0955   TRIG 93 06/06/2018 0955   HDL 68 06/06/2018 0955   CHOLHDL 2.2 06/06/2018 0955   CHOLHDL 1.8 06/13/2017 0844   VLDL 42.4 (H) 02/07/2016 1508   LDLCALC 64 06/06/2018 0955   LDLCALC 45 06/13/2017 0844   LDLDIRECT 74.0 02/07/2016 1508    She reports excellent compliance with treatment. She is not having side effects.   Wt Readings from Last 3 Encounters:  07/15/19 150 lb 6.4 oz (68.2 kg)  12/06/18 149 lb (67.6 kg)  06/04/18 144 lb (65.3 kg)    ------------------------------------------------------------------------  She also  report she has been taking OTC omeprazole for several months due to sensation of food getting stuck when she swallows, which has resolved since starting daily omeprazole. Sx return if she stops medication. She wishes to get prescription for omeprazole.   Allergies  Allergen Reactions  . Azithromycin Swelling    Itching, swelling, and rash  . Codeine Other (See Comments)    Mental changes  . Penicillins Itching and Rash     Current Outpatient Medications:  .  anastrozole (ARIMIDEX) 1 MG tablet, TAKE 1 TABLET BY MOUTH EVERY DAY, Disp: 90 tablet, Rfl: 1 .  carvedilol (COREG) 12.5 MG tablet, Take 1 tablet (12.5 mg total) by mouth 2 (two) times daily., Disp: 180 tablet, Rfl: 3 .  Cholecalciferol (VITAMIN D3) 1000 UNITS CAPS, Take 1 capsule by mouth daily., Disp: , Rfl:  .  FLUoxetine (PROZAC) 40  MG capsule, Take 1 capsule (40 mg total) by mouth daily., Disp: 90 capsule, Rfl: 3 .  glipiZIDE (GLUCOTROL XL) 2.5 MG 24 hr tablet, TAKE 1 TABLET BY MOUTH ONCE DAILY FOR DIABETES, Disp: 90 tablet, Rfl: 3 .  losartan (COZAAR) 50 MG tablet, Take 1 tablet (50 mg total) by mouth daily. PLEASE  CANCEL LOSARTAN-HCTZ COMBO REFILLS, Disp: 90 tablet, Rfl: 3 .  losartan-hydrochlorothiazide (HYZAAR) 50-12.5 MG tablet, , Disp: , Rfl:  .  metFORMIN (GLUCOPHAGE) 1000 MG tablet, TAKE 1 TABLET BY MOUTH TWICE A DAY FOR DIABETES, Disp: 180 tablet, Rfl: 4 .  simvastatin (ZOCOR) 40 MG tablet, TAKE 1 TABLET BY MOUTH EVERYDAY AT BEDTIME, Disp: 90 tablet, Rfl: 3 .  omeprazole (PRILOSEC) 20 MG capsule, Take 1 capsule (20 mg total) by mouth daily., Disp: 90 capsule, Rfl: 3  Review of Systems  Constitutional: Negative for appetite change, chills, fatigue and fever.  Respiratory: Negative for chest tightness and shortness of breath.   Cardiovascular: Negative for chest pain and palpitations.  Gastrointestinal: Negative for abdominal pain, nausea and vomiting.  Neurological: Negative for dizziness and weakness.    Social History   Tobacco Use  . Smoking status: Never Smoker  . Smokeless tobacco: Never Used  Substance Use Topics  . Alcohol use: No      Objective:   BP 125/77   Pulse 88   Temp (!) 96 F (35.6 C) (Oral)   Resp 16   Wt 150 lb 6.4 oz (68.2 kg)   LMP 08/30/2003   BMI 29.37 kg/m  Vitals:   07/15/19 1613  BP: 125/77  Pulse: 88  Resp: 16  Temp: (!) 96 F (35.6 C)  TempSrc: Oral  Weight: 150 lb 6.4 oz (68.2 kg)  Body mass index is 29.37 kg/m.   Physical Exam   General Appearance:    Well developed, well nourished female in no acute distress  Eyes:    PERRL, conjunctiva/corneas clear, EOM's intact       Lungs:     Clear to auscultation bilaterally, respirations unlabored  Heart:    Normal heart rate. Normal rhythm. No murmurs, rubs, or gallops.   MS:   All extremities are intact.   Neurologic:   Awake, alert, oriented x 3. No apparent focal neurological           defect.       Results for orders placed or performed in visit on 07/15/19  POCT HgB A1C  Result Value Ref Range   Hemoglobin A1C 7.0 (A) 4.0 - 5.6 %   HbA1c POC (<> result, manual entry)     HbA1c, POC  (prediabetic range)     HbA1c, POC (controlled diabetic range)         Assessment & Plan    1. Type 2 diabetes mellitus without complication, without long-term current use of insulin (HCC) Well controlled.  Continue current medications.  She reports eye exam at [facility] yesterday  2. Essential hypertension .2c Continue current medications.    3. Gastroesophageal reflux disease, unspecified whether esophagitis present Dysphagia resolved on omeprazole.  - omeprazole (PRILOSEC) 20 MG capsule; Take 1 capsule (20 mg total) by mouth daily.  Dispense: 90 capsule; Refill: 3  The entirety of the information documented in the History of Present Illness, Review of Systems and Physical Exam were personally obtained by me. Portions of this information were initially documented by Minette Headland, CMA and reviewed by me for thoroughness and accuracy.    Future  Appointments  Date Time Provider [place]  01/13/2020  1:40 PM Birdie Sons, MD [facility]  03/16/2020 10:00 AM [facility] None  03/16/2020 10:30 AM Cammie Sickle, MD [facility] None  03/24/2020  2:00 PM [facility]NURSE HEALTH ADVISOR [facility]       Lelon Huh, MD  [facility]

## 2019-08-04 LAB — HM DIABETES EYE EXAM

## 2019-08-08 ENCOUNTER — Ambulatory Visit (INDEPENDENT_AMBULATORY_CARE_PROVIDER_SITE_OTHER): Payer: Medicare HMO | Admitting: Family Medicine

## 2019-08-08 ENCOUNTER — Encounter: Payer: Self-pay | Admitting: Family Medicine

## 2019-08-08 ENCOUNTER — Telehealth: Payer: Self-pay | Admitting: Family Medicine

## 2019-08-08 DIAGNOSIS — J0110 Acute frontal sinusitis, unspecified: Secondary | ICD-10-CM | POA: Diagnosis not present

## 2019-08-08 MED ORDER — FLUTICASONE PROPIONATE 50 MCG/ACT NA SUSP: 2.0000 | Freq: Every day | NASAL | 0 refills | Status: DC

## 2019-08-08 MED ORDER — DOXYCYCLINE HYCLATE 100 MG PO TABS: 100.0000 mg | ORAL_TABLET | Freq: Two times a day (BID) | ORAL | 0 refills | Status: AC

## 2019-08-08 NOTE — Telephone Encounter (Signed)
Pt called to request rx for something to help with her sinus infection. Please advise.  [facility]

## 2019-08-08 NOTE — Telephone Encounter (Signed)
Patient scheduled for a virtual appointment today at 3 pm

## 2019-08-08 NOTE — Progress Notes (Signed)
Patient: Hannah [name] Female    DOB: July 03, 1951   69 y.o.   MRN: [id number] Visit Date: 08/08/2019  Today's Provider: Lelon Huh, MD   Chief Complaint  Patient presents with  . URI   Subjective:    Virtual Visit via Telephone Note  I connected with [place] on 08/12/19 at  4:00 PM EST by telephone and verified that I am speaking with the correct person using two identifiers.  Location: Patient: Home Provider: [facility]   I discussed the limitations, risks, security and privacy concerns of performing an evaluation and management service by telephone and the availability of in person appointments. I also discussed with the patient that there may be a patient responsible charge related to this service. The patient expressed understanding and agreed to proceed.   Location: Patient: home Provider: [facility]   I discussed the limitations of evaluation and management by telemedicine and the availability of in person appointments. The patient expressed understanding and agreed to proceed.  URI  This is a new problem. Episode onset: 1 week ago. The problem has been unchanged. There has been no fever. Associated symptoms include congestion, coughing, ear pain, headaches, rhinorrhea, sinus pain, sneezing and a sore throat. Treatments tried: salt water gargles, and OTC sinus medication  The treatment provided mild relief.  No fevers, chills or sweats. Cough is productive of yellow. No unusual loss of taste or smell and pressure across of front of head.   Allergies  Allergen Reactions  . Azithromycin Swelling    Itching, swelling, and rash  . Codeine Other (See Comments)    Mental changes  . Penicillins Itching and Rash     Current Outpatient Medications:  .  anastrozole (ARIMIDEX) 1 MG tablet, TAKE 1 TABLET BY MOUTH EVERY DAY, Disp: 90 tablet, Rfl: 1 .  carvedilol (COREG) 12.5 MG tablet, Take 1 tablet (12.5 mg total) by mouth 2 (two) times daily., Disp:  180 tablet, Rfl: 3 .  Cholecalciferol (VITAMIN D3) 1000 UNITS CAPS, Take 1 capsule by mouth daily., Disp: , Rfl:  .  FLUoxetine (PROZAC) 40 MG capsule, Take 1 capsule (40 mg total) by mouth daily., Disp: 90 capsule, Rfl: 3 .  glipiZIDE (GLUCOTROL XL) 2.5 MG 24 hr tablet, TAKE 1 TABLET BY MOUTH ONCE DAILY FOR DIABETES, Disp: 90 tablet, Rfl: 3 .  losartan (COZAAR) 50 MG tablet, Take 1 tablet (50 mg total) by mouth daily. PLEASE CANCEL LOSARTAN-HCTZ COMBO REFILLS, Disp: 90 tablet, Rfl: 3 .  losartan-hydrochlorothiazide (HYZAAR) 50-12.5 MG tablet, , Disp: , Rfl:  .  metFORMIN (GLUCOPHAGE) 1000 MG tablet, TAKE 1 TABLET BY MOUTH TWICE A DAY FOR DIABETES, Disp: 180 tablet, Rfl: 4 .  omeprazole (PRILOSEC) 20 MG capsule, Take 1 capsule (20 mg total) by mouth daily., Disp: 90 capsule, Rfl: 3 .  simvastatin (ZOCOR) 40 MG tablet, TAKE 1 TABLET BY MOUTH EVERYDAY AT BEDTIME, Disp: 90 tablet, Rfl: 3  Review of Systems  Constitutional: Negative.   HENT: Positive for congestion, ear pain, rhinorrhea, sinus pain, sneezing and sore throat.   Respiratory: Positive for cough.   Cardiovascular: Negative.   Musculoskeletal: Negative.   Neurological: Positive for headaches.    Social History   Tobacco Use  . Smoking status: Never Smoker  . Smokeless tobacco: Never Used  Substance Use Topics  . Alcohol use: No      Objective:   LMP 08/30/2003  There were no vitals filed for this visit.There is no  height or weight on file to calculate BMI.   Physical Exam  Awake, alert, oriented x 3. In no apparent distress  No results found for any visits on 08/08/19.     Assessment & Plan       1. Acute non-recurrent frontal sinusitis  - doxycycline (VIBRA-TABS) 100 MG tablet; Take 1 tablet (100 mg total) by mouth 2 (two) times daily for 7 days.  Dispense: 14 tablet; Refill: 0 - fluticasone (FLONASE) 50 MCG/ACT nasal spray; Place 2 sprays into both nostrils daily.  Dispense: 16 g; Refill: 0  There are no  diagnoses linked to this encounter.  I discussed the assessment and treatment plan with the patient. The patient was provided an opportunity to ask questions and all were answered. The patient agreed with the plan and demonstrated an understanding of the instructions.   The patient was advised to call back or seek an in-person evaluation if the symptoms worsen or if the condition fails to improve as anticipated.  I provided 12 minutes of non-face-to-face time during this encounter.  Call if symptoms change or if not rapidly improving.        Lelon Huh, MD  [facility]

## 2019-08-30 ENCOUNTER — Other Ambulatory Visit: Payer: Self-pay | Admitting: Family Medicine

## 2019-08-30 DIAGNOSIS — J0110 Acute frontal sinusitis, unspecified: Secondary | ICD-10-CM

## 2019-09-05 ENCOUNTER — Other Ambulatory Visit: Payer: Self-pay | Admitting: Family Medicine

## 2019-09-05 DIAGNOSIS — J0110 Acute frontal sinusitis, unspecified: Secondary | ICD-10-CM

## 2019-10-02 ENCOUNTER — Other Ambulatory Visit: Payer: Self-pay | Admitting: Family Medicine

## 2019-10-02 ENCOUNTER — Other Ambulatory Visit: Payer: Self-pay | Admitting: Internal Medicine

## 2019-10-02 DIAGNOSIS — C50811 Malignant neoplasm of overlapping sites of right female breast: Secondary | ICD-10-CM

## 2019-10-02 DIAGNOSIS — E119 Type 2 diabetes mellitus without complications: Secondary | ICD-10-CM

## 2019-10-02 DIAGNOSIS — Z79811 Long term (current) use of aromatase inhibitors: Secondary | ICD-10-CM

## 2019-10-02 NOTE — Telephone Encounter (Signed)
Requested Prescriptions  Pending Prescriptions Disp Refills  . metFORMIN (GLUCOPHAGE) 1000 MG tablet [Pharmacy Med Name: METFORMIN HCL 1,000 MG TABLET] 180 tablet 0    Sig: TAKE 1 TABLET BY MOUTH TWICE A DAY FOR DIABETES     Endocrinology:  Diabetes - Biguanides Passed - 10/02/2019  1:45 AM      Passed - Cr in normal range and within 360 days    Creat  Date Value Ref Range Status  06/13/2017 0.69 0.50 - 0.99 mg/dL Final    Comment:    For patients >41 years of age, the reference limit for Creatinine is approximately 13% higher for people identified as African-American. .    Creatinine, Ser  Date Value Ref Range Status  03/17/2019 0.86 0.44 - 1.00 mg/dL Final   Creatinine,U  Date Value Ref Range Status  02/07/2016 188.2 mg/dL Final         Passed - HBA1C is between 0 and 7.9 and within 180 days    Hemoglobin A1C  Date Value Ref Range Status  07/15/2019 7.0 (A) 4.0 - 5.6 % Final   Hgb A1c MFr Bld  Date Value Ref Range Status  06/13/2017 7.2 (H) <5.7 % of total Hgb Final    Comment:    For someone without known diabetes, a hemoglobin A1c value of 6.5% or greater indicates that they may have  diabetes and this should be confirmed with a follow-up  test. . For someone with known diabetes, a value <7% indicates  that their diabetes is well controlled and a value  greater than or equal to 7% indicates suboptimal  control. A1c targets should be individualized based on  duration of diabetes, age, comorbid conditions, and  other considerations. . Currently, no consensus exists regarding use of hemoglobin A1c for diagnosis of diabetes for children. .          Passed - eGFR in normal range and within 360 days    GFR, Est African American  Date Value Ref Range Status  06/13/2017 105 > OR = 60 mL/min/1.36m Final   GFR calc Af Amer  Date Value Ref Range Status  03/17/2019 >60 >60 mL/min Final   GFR, Est Non African American  Date Value Ref Range Status  06/13/2017 91  > OR = 60 mL/min/1.754mFinal   GFR calc non Af Amer  Date Value Ref Range Status  03/17/2019 >60 >60 mL/min Final   GFR  Date Value Ref Range Status  02/07/2016 70.31 >60.00 mL/min Final         Passed - Valid encounter within last 6 months    Recent Outpatient Visits          1 month ago [facility]-recurrent frontal sinusitis   [facility]   2 months ago Type 2 diabetes mellitus without complication, [facility] long-term [facility] use [facility]   10 months ago Type 2 diabetes mellitus without complication, [facility] use [facility]   1 year ago Type 2 diabetes mellitus without complication, [facility]   1 year ago Type 2 diabetes mellitus without complication, [facility] long-term [facility] use of [facility]      Future Appointments  In 3 months Fisher, Kirstie Peri, MD [facility], [place]

## 2019-10-13 ENCOUNTER — Other Ambulatory Visit: Payer: Self-pay | Admitting: Family Medicine

## 2019-10-13 DIAGNOSIS — C50911 Malignant neoplasm of unspecified site of right female breast: Secondary | ICD-10-CM

## 2019-10-14 ENCOUNTER — Other Ambulatory Visit: Payer: Self-pay | Admitting: Family Medicine

## 2019-10-14 NOTE — Telephone Encounter (Signed)
Requested medications are due for refill today? Listed on medication list as a historical med.  No information.    Requested medications are on active medication list?  Reported by patient.    Last Refill:  Uncertain.    Future visit scheduled?  Yes  Notes to Clinic:  Losartan is listed on patient's active medication list with a notation to cancel Losartan with HCTZ.

## 2019-12-09 ENCOUNTER — Other Ambulatory Visit: Payer: Self-pay | Admitting: Family Medicine

## 2019-12-18 ENCOUNTER — Telehealth: Payer: Self-pay

## 2019-12-18 ENCOUNTER — Other Ambulatory Visit: Payer: Self-pay | Admitting: Family Medicine

## 2019-12-18 DIAGNOSIS — Z1231 Encounter for screening mammogram for malignant neoplasm of breast: Secondary | ICD-10-CM

## 2019-12-18 NOTE — Telephone Encounter (Signed)
Copied from [place] [id number]. Topic: Referral - Request for Referral >> Dec 18, 2019 10:20 AM Rainey Pines A wrote: Has patient seen PCP for this complaint? {Yes *If NO, is insurance requiring patient see PCP for this issue before PCP can refer them? Preferred provider/office: [facility] Reason for referral: Mammogram

## 2019-12-22 ENCOUNTER — Other Ambulatory Visit: Payer: Self-pay | Admitting: Family Medicine

## 2019-12-22 NOTE — Telephone Encounter (Signed)
Order placed. Patient notified.  

## 2019-12-22 NOTE — Telephone Encounter (Signed)
Pt calling for an update. Please advise.

## 2019-12-22 NOTE — Addendum Note (Signed)
Addended by: Birdie Sons on: 12/22/2019 10:18 AM   Modules accepted: Orders

## 2019-12-22 NOTE — Telephone Encounter (Signed)
[facility] who stated that the patient had requested medication refilled on 10/19/19 #90.   Patient called. Patient notified that per the pharmacy refill for 90 days was supplied on 10/21/19. Pt states she is unable to locate the prescription for 90 days and only has a few pills left. Patient advised to contact insurance company to see if early refill could be approved. Patient verbalized understanding.

## 2019-12-22 NOTE — Telephone Encounter (Signed)
Medication Refill - Medication: losartan-hydrochlorothiazide   Has the patient contacted their pharmacy? Yes.   (Agent: If no, request that the patient contact the pharmacy for the refill.) (Agent: If yes, when and what did the pharmacy advise?)  Preferred Pharmacy (with phone number or street name):  [id number] [place], [address]  [address] [place] [address]  Phone: [phone] Fax: [phone]  Not a 24 hour pharmacy; exact hours not known.     Agent: Please be advised that RX refills may take up to 3 business days. We ask that you follow-up with your pharmacy.

## 2019-12-25 ENCOUNTER — Other Ambulatory Visit: Payer: Self-pay | Admitting: Family Medicine

## 2019-12-29 ENCOUNTER — Other Ambulatory Visit: Payer: Self-pay | Admitting: Family Medicine

## 2019-12-29 DIAGNOSIS — C50911 Malignant neoplasm of unspecified site of right female breast: Secondary | ICD-10-CM

## 2019-12-29 DIAGNOSIS — E119 Type 2 diabetes mellitus without complications: Secondary | ICD-10-CM

## 2020-01-13 ENCOUNTER — Encounter: Payer: Self-pay | Admitting: Family Medicine

## 2020-01-13 ENCOUNTER — Other Ambulatory Visit: Payer: Self-pay

## 2020-01-13 ENCOUNTER — Ambulatory Visit (INDEPENDENT_AMBULATORY_CARE_PROVIDER_SITE_OTHER): Payer: Medicare HMO | Admitting: Family Medicine

## 2020-01-13 VITALS — BP 122/82 | HR 98 | Temp 96.8°F | Wt 149.4 lb

## 2020-01-13 DIAGNOSIS — E119 Type 2 diabetes mellitus without complications: Secondary | ICD-10-CM

## 2020-01-13 DIAGNOSIS — E785 Hyperlipidemia, unspecified: Secondary | ICD-10-CM | POA: Diagnosis not present

## 2020-01-13 DIAGNOSIS — C50811 Malignant neoplasm of overlapping sites of right female breast: Secondary | ICD-10-CM

## 2020-01-13 DIAGNOSIS — I10 Essential (primary) hypertension: Secondary | ICD-10-CM

## 2020-01-13 DIAGNOSIS — K219 Gastro-esophageal reflux disease without esophagitis: Secondary | ICD-10-CM | POA: Diagnosis not present

## 2020-01-13 DIAGNOSIS — Z1159 Encounter for screening for other viral diseases: Secondary | ICD-10-CM | POA: Diagnosis not present

## 2020-01-13 LAB — POCT GLYCOSYLATED HEMOGLOBIN (HGB A1C)
Est. average glucose Bld gHb Est-mCnc: 177
Hemoglobin A1C: 7.8 % — AB (ref 4.0–5.6)

## 2020-01-13 NOTE — Patient Instructions (Signed)
.   Please review the attached list of medications and notify my office if there are any errors.   . Please bring all of your medications to every appointment so we can make sure that our medication list is the same as yours.   . Covid-19 vaccines: The Covid vaccines have been given to hundreds of millions of people and found to be very effective and are as safe as any other vaccine.  The [employer] vaccine has been associated with very rare dangerous blood clots, but only in adult women under the age of 60.  The risk of dying from Covid infections is much higher than having a serious reaction to the vaccine.  I strongly recommend getting fully vaccinated against Covid-19.  I recommend that adult women under 60 get fully vaccinated, but the Moderna and Pfizer vaccines may be safer for those women than the Johnson [employer] vaccine.   

## 2020-01-13 NOTE — Progress Notes (Signed)
Established patient visit   Patient: Hannah [name]   DOB: 24-Jun-1951   69 y.o. Female  MRN: [id number] Visit Date: 01/13/2020  Today's healthcare provider: Lelon Huh, MD   Chief Complaint  Patient presents with  . Diabetes  . Hypertension  . Hyperlipidemia  . Gastroesophageal Reflux   I,Latasha Walston,acting as a scribe for Lelon Huh, MD.,have documented all relevant documentation on the behalf of Lelon Huh, MD,as directed by  Lelon Huh, MD while in the presence of Lelon Huh, MD.  Subjective    HPI Diabetes Mellitus Type II, Follow-up  Lab Results  Component Value Date   HGBA1C 7.0 (A) 07/15/2019   HGBA1C 7.4 (A) 12/06/2018   HGBA1C 7.3 (A) 06/04/2018   Wt Readings from Last 3 Encounters:  01/13/20 149 lb 6.4 oz (67.8 kg)  07/15/19 150 lb 6.4 oz (68.2 kg)  12/06/18 149 lb (67.6 kg)   Last seen for diabetes 5 months ago.  Management since then includes no change. She reports good compliance with treatment. She is not having side effects.  Symptoms: No fatigue No foot ulcerations  No appetite changes No nausea  No paresthesia of the feet  No polydipsia  No polyuria No visual disturbances   No vomiting     Home blood sugar records: fasting range: not being checked at home  Episodes of hypoglycemia? No    Current insulin regiment: None Most Recent Eye Exam: 08/04/2019 Current exercise: dancing Current diet habits: in general, an "unhealthy" diet  Pertinent Labs: Lab Results  Component Value Date   CHOL 151 06/06/2018   HDL 68 06/06/2018   LDLCALC 64 06/06/2018   LDLDIRECT 74.0 02/07/2016   TRIG 93 06/06/2018   CHOLHDL 2.2 06/06/2018   Lab Results  Component Value Date   NA 135 03/17/2019   K 4.4 03/17/2019   CREATININE 0.86 03/17/2019   GFRNONAA >60 03/17/2019   GFRAA >60 03/17/2019   GLUCOSE 154 (H) 03/17/2019      --------------------------------------------------------------------------------------------------- Hypertension, follow-up  BP Readings from Last 3 Encounters:  01/13/20 122/82  07/15/19 125/77  12/06/18 118/74   Wt Readings from Last 3 Encounters:  01/13/20 149 lb 6.4 oz (67.8 kg)  07/15/19 150 lb 6.4 oz (68.2 kg)  12/06/18 149 lb (67.6 kg)     She was last seen for hypertension 6 months ago.  BP at that visit was 125/77. Management since that visit includes no change.  She reports good compliance with treatment. She is not having side effects.  She is following a Regular diet. She is exercising. She does not smoke.  Use of agents associated with hypertension: none.   Outside blood pressures are not being checked at home. Symptoms: No chest pain No chest pressure  No palpitations No syncope  No dyspnea No orthopnea  No paroxysmal nocturnal dyspnea No lower extremity edema   Pertinent labs: Lab Results  Component Value Date   CHOL 151 06/06/2018   HDL 68 06/06/2018   LDLCALC 64 06/06/2018   LDLDIRECT 74.0 02/07/2016   TRIG 93 06/06/2018   CHOLHDL 2.2 06/06/2018   Lab Results  Component Value Date   NA 135 03/17/2019   K 4.4 03/17/2019   CREATININE 0.86 03/17/2019   GFRNONAA >60 03/17/2019   GFRAA >60 03/17/2019   GLUCOSE 154 (H) 03/17/2019     The 10-year ASCVD risk score Mikey Bussing DC Jr., et al., 2013) is: 16.6%   --------------------------------------------------------------------------------------------------- Lipid/Cholesterol, Follow-up  Last lipid panel Other pertinent labs  Lab Results  Component Value Date   CHOL 151 06/06/2018   HDL 68 06/06/2018   LDLCALC 64 06/06/2018   LDLDIRECT 74.0 02/07/2016   TRIG 93 06/06/2018   CHOLHDL 2.2 06/06/2018   Lab Results  Component Value Date   ALT 17 03/17/2019   AST 18 03/17/2019   PLT 402 (H) 03/17/2019     She was last seen for this 6 months ago.  Management since that visit includes no  change.  She reports good compliance with treatment. She is not having side effects.   Symptoms: No chest pain No chest pressure/discomfort  No dyspnea No lower extremity edema  No numbness or tingling of extremity No orthopnea  No palpitations No paroxysmal nocturnal dyspnea  No speech difficulty No syncope   Current diet: in general, an "unhealthy" diet Current exercise: dancing  The 10-year ASCVD risk score Mikey Bussing DC Jr., et al., 2013) is: 16.6%  ---------------------------------------------------------------------------------------------------  GERD, Follow up:  The patient was last seen for GERD 6 months ago. Changes made since that visit include no change.  She reports good compliance with treatment. She is not having side effects. .  She IS experiencing no symptoms.   -----------------------------------------------------------------------------------------       Medications: Outpatient Medications Prior to Visit  Medication Sig  . anastrozole (ARIMIDEX) 1 MG tablet TAKE 1 TABLET BY MOUTH EVERY DAY  . carvedilol (COREG) 12.5 MG tablet TAKE 1 TABLET (12.5 MG TOTAL) BY MOUTH 2 (TWO) TIMES DAILY.  [name] Cholecalciferol (VITAMIN D3) 1000 UNITS CAPS Take 1 capsule by mouth daily.  [name] FLUoxetine (PROZAC) 40 MG capsule TAKE 1 CAPSULE BY MOUTH EVERY DAY  . fluticasone (FLONASE) 50 MCG/ACT nasal spray SPRAY 2 SPRAYS INTO EACH NOSTRIL EVERY DAY  . glipiZIDE (GLUCOTROL XL) 2.5 MG 24 hr tablet TAKE 1 TABLET BY MOUTH ONCE DAILY FOR DIABETES  . losartan (COZAAR) 50 MG tablet Take 1 tablet (50 mg total) by mouth daily. PLEASE CANCEL LOSARTAN-HCTZ COMBO REFILLS  . losartan-hydrochlorothiazide (HYZAAR) 50-12.5 MG tablet TAKE 1 TABLET BY MOUTH EVERY DAY  . metFORMIN (GLUCOPHAGE) 1000 MG tablet TAKE 1 TABLET BY MOUTH TWICE A DAY FOR DIABETES  . omeprazole (PRILOSEC) 20 MG capsule Take 1 capsule (20 mg total) by mouth daily.  . simvastatin (ZOCOR) 40 MG tablet TAKE 1 TABLET BY MOUTH  EVERYDAY AT BEDTIME   No facility-administered medications prior to visit.    Review of Systems  Constitutional: Negative.   Respiratory: Negative.   Cardiovascular: Negative.   Musculoskeletal: Negative.       Objective    BP 122/82 (BP Location: Right Arm, Patient Position: Sitting, Cuff Size: Normal)   Pulse 98   Temp (!) 96.8 F (36 C) (Temporal)   Wt 149 lb 6.4 oz (67.8 kg)   LMP 08/30/2003   BMI 29.18 kg/m    Physical Exam   General: Appearance:     Overweight female in no acute distress  Eyes:    PERRL, conjunctiva/corneas clear, EOM's intact       Lungs:     Clear to auscultation bilaterally, respirations unlabored  Heart:    Normal heart rate. Normal rhythm. No murmurs, rubs, or gallops.   MS:   All extremities are intact.   Neurologic:   Awake, alert, oriented x 3. No apparent focal neurological           defect.        Results for orders placed or performed in visit on 01/13/20  POCT HgB A1C  Result Value Ref Range   Hemoglobin A1C 7.8 (A) 4.0 - 5.6 %   Est. average glucose Bld gHb Est-mCnc 177   HM DIABETES EYE EXAM  Result Value Ref Range   HM Diabetic Eye Exam No Retinopathy No Retinopathy    Assessment & Plan     1. Type 2 diabetes mellitus without complication, without long-term current use of insulin (HCC) A1c is increasing. She is to be more strict with diet. Check labs. Recheck a1c in 3-4 months. Same medications.   2. Essential hypertension Well controlled.  Continue current medications.   - CBC with Differential - Comprehensive Metabolic Panel (CMET) - TSH  3. Hyperlipidemia, unspecified hyperlipidemia type She is tolerating simvastatin well with no adverse effects.   - Lipid Profile  4. Gastroesophageal reflux disease, unspecified whether esophagitis present Well controlled on omeprazole.   5. Need for hepatitis C screening test  - Hepatitis C Antibody  Other orders - [facility] DIABETES EYE EXAM       The entirety of the  information documented in the History of Present Illness, Review of Systems and Physical Exam were personally obtained by me. Portions of this information were initially documented by the CMA and reviewed by me for thoroughness and accuracy.      Lelon Huh, MD  [facility] [phone] (phone) [phone] (fax)  [place]

## 2020-01-14 DIAGNOSIS — I10 Essential (primary) hypertension: Secondary | ICD-10-CM | POA: Diagnosis not present

## 2020-01-14 DIAGNOSIS — E785 Hyperlipidemia, unspecified: Secondary | ICD-10-CM | POA: Diagnosis not present

## 2020-01-14 DIAGNOSIS — Z1159 Encounter for screening for other viral diseases: Secondary | ICD-10-CM | POA: Diagnosis not present

## 2020-01-15 LAB — COMPREHENSIVE METABOLIC PANEL
ALT: 22 IU/L (ref 0–32)
AST: 22 IU/L (ref 0–40)
Albumin/Globulin Ratio: 1.8 (ref 1.2–2.2)
Albumin: 4.5 g/dL (ref 3.8–4.8)
Alkaline Phosphatase: 62 IU/L (ref 48–121)
BUN/Creatinine Ratio: 18 (ref 12–28)
BUN: 15 mg/dL (ref 8–27)
Bilirubin Total: 0.5 mg/dL (ref 0.0–1.2)
CO2: 19 mmol/L — ABNORMAL LOW (ref 20–29)
Calcium: 9.5 mg/dL (ref 8.7–10.3)
Chloride: 98 mmol/L (ref 96–106)
Creatinine, Ser: 0.85 mg/dL (ref 0.57–1.00)
GFR calc Af Amer: 81 mL/min/{1.73_m2} (ref >59)
GFR calc non Af Amer: 70 mL/min/{1.73_m2} (ref >59)
Globulin, Total: 2.5 g/dL (ref 1.5–4.5)
Glucose: 174 mg/dL — ABNORMAL HIGH (ref 65–99)
Potassium: 4.7 mmol/L (ref 3.5–5.2)
Sodium: 136 mmol/L (ref 134–144)
Total Protein: 7 g/dL (ref 6.0–8.5)

## 2020-01-15 LAB — CBC WITH DIFFERENTIAL/PLATELET
Basophils Absolute: 0.1 10*3/uL (ref 0.0–0.2)
Basos: 1 %
EOS (ABSOLUTE): 0.2 10*3/uL (ref 0.0–0.4)
Eos: 3 %
Hematocrit: 36.7 % (ref 34.0–46.6)
Hemoglobin: 12.2 g/dL (ref 11.1–15.9)
Immature Grans (Abs): 0 10*3/uL (ref 0.0–0.1)
Immature Granulocytes: 0 %
Lymphocytes Absolute: 1.9 10*3/uL (ref 0.7–3.1)
Lymphs: 33 %
MCH: 30 pg (ref 26.6–33.0)
MCHC: 33.2 g/dL (ref 31.5–35.7)
MCV: 90 fL (ref 79–97)
Monocytes Absolute: 0.5 10*3/uL (ref 0.1–0.9)
Monocytes: 9 %
Neutrophils Absolute: 3.1 10*3/uL (ref 1.4–7.0)
Neutrophils: 54 %
Platelets: 457 10*3/uL — ABNORMAL HIGH (ref 150–450)
RBC: 4.07 x10E6/uL (ref 3.77–5.28)
RDW: 12.5 % (ref 11.7–15.4)
WBC: 5.7 10*3/uL (ref 3.4–10.8)

## 2020-01-15 LAB — LIPID PANEL
Chol/HDL Ratio: 2.1 ratio (ref 0.0–4.4)
Cholesterol, Total: 162 mg/dL (ref 100–199)
HDL: 76 mg/dL (ref >39)
LDL Chol Calc (NIH): 68 mg/dL (ref 0–99)
Triglycerides: 99 mg/dL (ref 0–149)
VLDL Cholesterol Cal: 18 mg/dL (ref 5–40)

## 2020-01-15 LAB — TSH: TSH: 1.63 u[IU]/mL (ref 0.450–4.500)

## 2020-01-15 LAB — HEPATITIS C ANTIBODY: Hep C Virus Ab: <0.1 s/co ratio (ref 0.0–0.9)

## 2020-01-29 ENCOUNTER — Other Ambulatory Visit: Payer: Self-pay | Admitting: Family Medicine

## 2020-01-29 DIAGNOSIS — C50911 Malignant neoplasm of unspecified site of right female breast: Secondary | ICD-10-CM

## 2020-01-29 DIAGNOSIS — E119 Type 2 diabetes mellitus without complications: Secondary | ICD-10-CM

## 2020-02-10 ENCOUNTER — Ambulatory Visit
Admission: RE | Admit: 2020-02-10 | Discharge: 2020-02-10 | Disposition: A | Discharge status: Home / Self Care | Payer: Medicare HMO | Source: Ambulatory Visit | Attending: Family Medicine | Admitting: Family Medicine

## 2020-02-10 DIAGNOSIS — Z1231 Encounter for screening mammogram for malignant neoplasm of breast: Secondary | ICD-10-CM | POA: Diagnosis not present

## 2020-03-05 ENCOUNTER — Other Ambulatory Visit: Payer: Self-pay | Admitting: Family Medicine

## 2020-03-05 DIAGNOSIS — C50911 Malignant neoplasm of unspecified site of right female breast: Secondary | ICD-10-CM

## 2020-03-16 ENCOUNTER — Other Ambulatory Visit: Payer: Self-pay

## 2020-03-16 ENCOUNTER — Inpatient Hospital Stay: Payer: Medicare HMO | Admitting: Internal Medicine

## 2020-03-16 ENCOUNTER — Inpatient Hospital Stay: Payer: Medicare HMO

## 2020-03-16 DIAGNOSIS — C50811 Malignant neoplasm of overlapping sites of right female breast: Secondary | ICD-10-CM

## 2020-03-17 ENCOUNTER — Other Ambulatory Visit: Payer: Self-pay

## 2020-03-17 ENCOUNTER — Encounter: Payer: Self-pay | Admitting: Internal Medicine

## 2020-03-17 ENCOUNTER — Inpatient Hospital Stay: Payer: Medicare HMO | Attending: Internal Medicine

## 2020-03-17 ENCOUNTER — Inpatient Hospital Stay (HOSPITAL_BASED_OUTPATIENT_CLINIC_OR_DEPARTMENT_OTHER): Payer: Medicare HMO | Admitting: Internal Medicine

## 2020-03-17 VITALS — BP 133/86 | HR 84 | Temp 97.7°F | Resp 16 | Ht 60.0 in | Wt 146.8 lb

## 2020-03-17 DIAGNOSIS — C50811 Malignant neoplasm of overlapping sites of right female breast: Secondary | ICD-10-CM | POA: Diagnosis not present

## 2020-03-17 DIAGNOSIS — Z170 Estrogen receptor positive status [ER+]: Secondary | ICD-10-CM | POA: Insufficient documentation

## 2020-03-17 DIAGNOSIS — Z13820 Encounter for screening for osteoporosis: Secondary | ICD-10-CM | POA: Diagnosis not present

## 2020-03-17 DIAGNOSIS — D649 Anemia, unspecified: Secondary | ICD-10-CM

## 2020-03-17 DIAGNOSIS — M1990 Unspecified osteoarthritis, unspecified site: Secondary | ICD-10-CM | POA: Insufficient documentation

## 2020-03-17 DIAGNOSIS — Z79811 Long term (current) use of aromatase inhibitors: Secondary | ICD-10-CM | POA: Insufficient documentation

## 2020-03-17 LAB — COMPREHENSIVE METABOLIC PANEL
ALT: 20 U/L (ref 0–44)
AST: 22 U/L (ref 15–41)
Albumin: 3.8 g/dL (ref 3.5–5.0)
Alkaline Phosphatase: 50 U/L (ref 38–126)
Anion gap: 12 (ref 5–15)
BUN: 18 mg/dL (ref 8–23)
CO2: 22 mmol/L (ref 22–32)
Calcium: 8.8 mg/dL — ABNORMAL LOW (ref 8.9–10.3)
Chloride: 102 mmol/L (ref 98–111)
Creatinine, Ser: 0.9 mg/dL (ref 0.44–1.00)
GFR calc Af Amer: >60 mL/min (ref >60)
GFR calc non Af Amer: >60 mL/min (ref >60)
Glucose, Bld: 199 mg/dL — ABNORMAL HIGH (ref 70–99)
Potassium: 4.5 mmol/L (ref 3.5–5.1)
Sodium: 136 mmol/L (ref 135–145)
Total Bilirubin: 0.6 mg/dL (ref 0.3–1.2)
Total Protein: 6.8 g/dL (ref 6.5–8.1)

## 2020-03-17 LAB — CBC WITH DIFFERENTIAL/PLATELET
Abs Immature Granulocytes: 0.02 10*3/uL (ref 0.00–0.07)
Basophils Absolute: 0.1 10*3/uL (ref 0.0–0.1)
Basophils Relative: 1 %
Eosinophils Absolute: 0.3 10*3/uL (ref 0.0–0.5)
Eosinophils Relative: 4 %
HCT: 33.8 % — ABNORMAL LOW (ref 36.0–46.0)
Hemoglobin: 11.2 g/dL — ABNORMAL LOW (ref 12.0–15.0)
Immature Granulocytes: 0 %
Lymphocytes Relative: 27 %
Lymphs Abs: 2 10*3/uL (ref 0.7–4.0)
MCH: 29.6 pg (ref 26.0–34.0)
MCHC: 33.1 g/dL (ref 30.0–36.0)
MCV: 89.4 fL (ref 80.0–100.0)
Monocytes Absolute: 0.5 10*3/uL (ref 0.1–1.0)
Monocytes Relative: 7 %
Neutro Abs: 4.4 10*3/uL (ref 1.7–7.7)
Neutrophils Relative %: 61 %
Platelets: 406 10*3/uL — ABNORMAL HIGH (ref 150–400)
RBC: 3.78 MIL/uL — ABNORMAL LOW (ref 3.87–5.11)
RDW: 12.6 % (ref 11.5–15.5)
WBC: 7.2 10*3/uL (ref 4.0–10.5)
nRBC: 0 % (ref 0.0–0.2)

## 2020-03-17 NOTE — Progress Notes (Signed)
[place] OFFICE PROGRESS NOTE  Patient Care Team: Birdie Sons, MD as PCP - General (Family Medicine) Cammie Sickle, MD as Consulting Physician (Internal Medicine) [place], [place] [facility])  Cancer Staging No matching staging information was found for the patient.   Oncology History Overview Note  # AUG 2011- T1b N0 M0 (clinical, stage I) infiltrating ductal carcinoma of the right breast status post wide local excision and sentinel node study March 14, 2010. Tumor size 9 mm, grade 2, margins negative.  2 sentinel lymph nodes negative.  ER positive (90%), PR borderline (5%) HER-2/neu 2+ on IHC, negative on FISH. Her2/CEP ratio is 1.45. April 11, 2010 - Oncotype DX score is 35 (high risk group) with average rate of distant recurrence of 24%. Got mammosite.   Patient completed adjuvant chemotherapy with Taxotere/Cytoxan, then radiation, then started aromatase inhibitor in February 2012. ---------------------------------------  DIAGNOSIS: RIGHT BREAST CA  STAGE:  I       ;GOALS: CURE  CURRENT/MOST RECENT THERAPY : Arimidex    Malignant neoplasm of overlapping sites of right breast ([place])      INTERVAL HISTORY:  Hannah [name] 69 y.o.  female pleasant patient above history of breast cancer ER PR positive HER-2 negative currently on Arimidex is here for follow-up.  In the interim evaluated by Dr. Hazel Sams normal.  Patient denies any worsening joint pains or bone pain.  She has chronic mild arthritis.  No new lumps bumps.  Appetite is good.  No weight loss.  No new shortness of breath or cough.   Review of Systems  Constitutional: Negative for chills, diaphoresis, fever, malaise/fatigue and weight loss.  HENT: Negative for nosebleeds and sore throat.   Eyes: Negative for double vision.  Respiratory: Negative for cough, hemoptysis, sputum production, shortness of breath and wheezing.   Cardiovascular: Negative for chest pain,  palpitations, orthopnea and leg swelling.  Gastrointestinal: Negative for abdominal pain, blood in stool, constipation, diarrhea, heartburn, melena, nausea and vomiting.  Genitourinary: Negative for dysuria, frequency and urgency.  Musculoskeletal: Positive for back pain and joint pain.  Skin: Negative.  Negative for itching and rash.  Neurological: Negative for dizziness, tingling, focal weakness, weakness and headaches.  Endo/Heme/Allergies: Does not bruise/bleed easily.  Psychiatric/Behavioral: Negative for depression. The patient is not nervous/anxious and does not have insomnia.       PAST MEDICAL HISTORY :  Past Medical History:  Diagnosis Date  . Allergy   . Arthritis   . Cancer ([place]) 2011-Right    DCIS. 9 mm histologic grade 2 invasive mammary carcinoma, ER 90%, PR 5%, HER-2/neu not over expressed. Sentinel nodes negative.  . Chicken pox   . Diabetes mellitus without complication ([place])   . Fractured elbow   . History of lumpectomy 2011   Right breast  . Hyperlipidemia   . Hypertension 2001  . Personal history of chemotherapy 2011   Right Breast  . Personal history of radiation therapy 2011   Right breast  . Radial head fracture, closed 12/19/2016    PAST SURGICAL HISTORY :   Past Surgical History:  Procedure Laterality Date  . BREAST BIOPSY Right 2011   Invasive Ductal Carcinoma  . BREAST LUMPECTOMY Right 2011  . BREAST SURGERY Right 2011    right breast wide excision, partial breast radiation.  . COLONOSCOPY  Declines   Reports she has annual stool fecal occult blood testing.  [name] DILATION AND CURETTAGE OF UTERUS  1990  . PORT-A-CATH REMOVAL  2012  .  right breast wide excision  2011  . WRIST SURGERY  1998   pins placed    FAMILY HISTORY :   Family History  Problem Relation Age of Onset  . Arthritis Mother   . Alzheimer's disease Mother   . Arthritis Father   . Heart disease Father   . Arthritis Maternal Grandmother   . Breast cancer Maternal Grandmother    . Alzheimer's disease Maternal Grandmother   . Stroke Maternal Grandfather     SOCIAL HISTORY:   Social History   Tobacco Use  . Smoking status: Never Smoker  . Smokeless tobacco: Never Used  Vaping Use  . Vaping Use: Never used  Substance Use Topics  . Alcohol use: No  . Drug use: No    ALLERGIES:  is allergic to azithromycin, codeine, and penicillins.  MEDICATIONS:  Current Outpatient Medications  Medication Sig Dispense Refill  . anastrozole (ARIMIDEX) 1 MG tablet TAKE 1 TABLET BY MOUTH EVERY DAY 90 tablet 1  . carvedilol (COREG) 12.5 MG tablet TAKE 1 TABLET BY MOUTH 2 TIMES DAILY. 180 tablet 0  . FLUoxetine (PROZAC) 40 MG capsule TAKE 1 CAPSULE BY MOUTH EVERY DAY 90 capsule 3  . fluticasone (FLONASE) 50 MCG/ACT nasal spray SPRAY 2 SPRAYS INTO EACH NOSTRIL EVERY DAY 16 mL 1  . glipiZIDE (GLUCOTROL XL) 2.5 MG 24 hr tablet TAKE 1 TABLET BY MOUTH ONCE DAILY FOR DIABETES 90 tablet 3  . losartan-hydrochlorothiazide (HYZAAR) 50-12.5 MG tablet TAKE 1 TABLET BY MOUTH EVERY DAY 90 tablet 4  . metFORMIN (GLUCOPHAGE) 1000 MG tablet TAKE 1 TABLET BY MOUTH TWICE A DAY FOR DIABETES 60 tablet 4  . omeprazole (PRILOSEC) 20 MG capsule Take 1 capsule (20 mg total) by mouth daily. 90 capsule 3  . simvastatin (ZOCOR) 40 MG tablet TAKE 1 TABLET BY MOUTH EVERYDAY AT BEDTIME 90 tablet 3  . Cholecalciferol (VITAMIN D3) 1000 UNITS CAPS Take 1 capsule by mouth daily. (Patient not taking: Reported on 03/17/2020)     No current facility-administered medications for this visit.    PHYSICAL EXAMINATION: ECOG PERFORMANCE STATUS: 0 - Asymptomatic  BP 133/86 (BP Location: Left Arm, Patient Position: Sitting, Cuff Size: Large)   Pulse 84   Temp 97.7 F (36.5 C) (Tympanic)   Resp 16   Ht 5' (1.524 m)   Wt 146 lb 12.8 oz (66.6 kg)   LMP 08/30/2003   SpO2 99%   BMI 28.67 kg/m   Filed Weights   03/17/20 1044  Weight: 146 lb 12.8 oz (66.6 kg)    Physical Exam HENT:     Head: Normocephalic and  atraumatic.     Mouth/Throat:     Pharynx: No oropharyngeal exudate.  Eyes:     Pupils: Pupils are equal, round, and reactive to light.  Cardiovascular:     Rate and Rhythm: Normal rate and regular rhythm.  Pulmonary:     Effort: No respiratory distress.     Breath sounds: No wheezing.  Abdominal:     General: Bowel sounds are normal. There is no distension.     Palpations: Abdomen is soft. There is no mass.     Tenderness: There is no abdominal tenderness. There is no guarding or rebound.  Musculoskeletal:        General: No tenderness. Normal range of motion.     Cervical back: Normal range of motion and neck supple.  Skin:    General: Skin is warm.  Neurological:     Mental Status: She is  alert and oriented to person, place, and time.  Psychiatric:        Mood and Affect: Affect normal.        LABORATORY DATA:  I have reviewed the data as listed    Component Value Date/Time   NA 136 03/17/2020 1022   NA 136 01/14/2020 1025   K 4.5 03/17/2020 1022   CL 102 03/17/2020 1022   CO2 22 03/17/2020 1022   GLUCOSE 199 (H) 03/17/2020 1022   BUN 18 03/17/2020 1022   BUN 15 01/14/2020 1025   CREATININE 0.90 03/17/2020 1022   CREATININE 0.69 06/13/2017 0844   CALCIUM 8.8 (L) 03/17/2020 1022   CALCIUM 9.9 02/10/2014 1458   PROT 6.8 03/17/2020 1022   PROT 7.0 01/14/2020 1025   PROT 7.4 02/10/2014 1458   ALBUMIN 3.8 03/17/2020 1022   ALBUMIN 4.5 01/14/2020 1025   ALBUMIN 3.8 02/10/2014 1458   AST 22 03/17/2020 1022   AST 17 02/10/2014 1458   ALT 20 03/17/2020 1022   ALT 34 02/10/2014 1458   ALKPHOS 50 03/17/2020 1022   ALKPHOS 69 02/10/2014 1458   BILITOT 0.6 03/17/2020 1022   BILITOT 0.5 01/14/2020 1025   BILITOT 0.5 02/10/2014 1458   GFRNONAA >60 03/17/2020 1022   GFRNONAA 91 06/13/2017 0844   GFRAA >60 03/17/2020 1022   GFRAA 105 06/13/2017 0844    No results found for: SPEP, UPEP  Lab Results  Component Value Date   WBC 7.2 03/17/2020   NEUTROABS 4.4  03/17/2020   HGB 11.2 (L) 03/17/2020   HCT 33.8 (L) 03/17/2020   MCV 89.4 03/17/2020   PLT 406 (H) 03/17/2020      Chemistry      Component Value Date/Time   NA 136 03/17/2020 1022   NA 136 01/14/2020 1025   K 4.5 03/17/2020 1022   CL 102 03/17/2020 1022   CO2 22 03/17/2020 1022   BUN 18 03/17/2020 1022   BUN 15 01/14/2020 1025   CREATININE 0.90 03/17/2020 1022   CREATININE 0.69 06/13/2017 0844      Component Value Date/Time   CALCIUM 8.8 (L) 03/17/2020 1022   CALCIUM 9.9 02/10/2014 1458   ALKPHOS 50 03/17/2020 1022   ALKPHOS 69 02/10/2014 1458   AST 22 03/17/2020 1022   AST 17 02/10/2014 1458   ALT 20 03/17/2020 1022   ALT 34 02/10/2014 1458   BILITOT 0.6 03/17/2020 1022   BILITOT 0.5 01/14/2020 1025   BILITOT 0.5 02/10/2014 1458       RADIOGRAPHIC STUDIES: I have personally reviewed the radiological images as listed and agreed with the findings in the report. No results found.   ASSESSMENT & PLAN:  Malignant neoplasm of overlapping sites of right breast ([place]) # STAGE I Breast ca ER/PR-Pos; her 2 Neu NEG [high risk oncotype]. Patient Arimidex/extended duration.  STABLE;   No evidence of recurrence.  July  2021- mammogram normal. Will  STOP in 6 months/Feb 2021.   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  # Mild anemia- Hb 11.9; cologuard  [pcp ? 2-3 years ago]-Jan 2020 iron studies -saturation 14 ferritin 13.  Previously declined colonoscopy; agreeable to colonoscopy today.  She will reach out to Dr. Bary Castilla regarding colonoscopy/EGD  # BMD april 2019 normal. STABLE.  Continue calcium with vitamin D; repeat BMD in 6 months.  # DISPOSITION:  # follow up in 6 months- MD/ labs- cbc/cmp/iron studies/ferritin; BMD-- Dr.B.    Orders Placed This Encounter  Procedures  . DG Bone Density  Standing Status:   Future    Standing Expiration Date:   03/17/2021    Order Specific Question:   Reason for Exam (SYMPTOM  OR DIAGNOSIS REQUIRED)    Answer:   osteoporosis screening     Order Specific Question:   Preferred imaging location?    Answer:   [facility]  . Comprehensive metabolic panel    Standing Status:   Future    Standing Expiration Date:   03/17/2021  . CBC with Differential    Standing Status:   Future    Standing Expiration Date:   03/17/2021  . Ferritin    Standing Status:   Future    Standing Expiration Date:   03/17/2021  . Iron and TIBC    Standing Status:   Future    Standing Expiration Date:   03/17/2021   All questions were answered. The patient knows to call the clinic with any problems, questions or concerns.      Cammie Sickle, MD 03/17/2020 12:30 PM

## 2020-03-17 NOTE — Assessment & Plan Note (Addendum)
#   STAGE I Breast ca ER/PR-Pos; her 2 Neu NEG [high risk oncotype]. Patient Arimidex/extended duration.  STABLE;   No evidence of recurrence.  July  2021- mammogram normal. Will  STOP in 6 months/Feb 2021.   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  # Mild anemia- Hb 11.9; cologuard  [pcp ? 2-3 years ago]-Jan 2020 iron studies -saturation 14 ferritin 13.  Previously declined colonoscopy; agreeable to colonoscopy today.  She will reach out to Dr. Bary Castilla regarding colonoscopy/EGD  # BMD april 2019 normal. STABLE.  Continue calcium with vitamin D; repeat BMD in 6 months.  # DISPOSITION:  # follow up in 6 months- MD/ labs- cbc/cmp/iron studies/ferritin; BMD-- Dr.B.

## 2020-03-23 NOTE — Progress Notes (Signed)
Subjective:   Hannah [name] is a 69 y.o. female who presents for Medicare Annual (Subsequent) preventive examination.  I connected with Darlyne Schmiesing today by telephone and verified that I am speaking with the correct person using two identifiers. Location patient: home Location provider: work Persons participating in the virtual visit: patient, provider.   I discussed the limitations, risks, security and privacy concerns of performing an evaluation and management service by telephone and the availability of in person appointments. I also discussed with the patient that there may be a patient responsible charge related to this service. The patient expressed understanding and verbally consented to this telephonic visit.    Interactive audio and video telecommunications were attempted between this provider and patient, however failed, due to patient having technical difficulties OR patient did not have access to video capability.  We continued and completed visit with audio only.   Review of Systems    N/A  Cardiac Risk Factors include: advanced age (>62mn, >>49women);diabetes mellitus;dyslipidemia;hypertension     Objective:    Today's Vitals   03/24/20 1352  PainSc: 0-No pain   There is no height or weight on file to calculate BMI.  Advanced Directives 03/24/2020 03/24/2019 03/17/2019 10/15/2017 03/14/2017 03/02/2016 03/02/2016  Does Patient Have a Medical Advance Directive? No No No Yes No No No  Type of Advance Directive - - -Public librarianLiving will - - -  Copy of HFreeportin Chart? - - - No - copy requested - - -  Would patient like information on creating a medical advance directive? No - Patient declined No - Patient declined No - Patient declined - No - Patient declined - -    Current Medications (verified) Outpatient Encounter Medications as of 03/24/2020  Medication Sig  . anastrozole (ARIMIDEX) 1 MG tablet TAKE 1 TABLET BY MOUTH EVERY  DAY  . carvedilol (COREG) 12.5 MG tablet TAKE 1 TABLET BY MOUTH 2 TIMES DAILY.  .Marland KitchenCholecalciferol (VITAMIN D3) 1000 UNITS CAPS Take 1 capsule by mouth daily.   .Marland KitchenFLUoxetine (PROZAC) 40 MG capsule TAKE 1 CAPSULE BY MOUTH EVERY DAY  . fluticasone (FLONASE) 50 MCG/ACT nasal spray SPRAY 2 SPRAYS INTO EACH NOSTRIL EVERY DAY (Patient taking differently: As needed)  . glipiZIDE (GLUCOTROL XL) 2.5 MG 24 hr tablet TAKE 1 TABLET BY MOUTH ONCE DAILY FOR DIABETES  . losartan-hydrochlorothiazide (HYZAAR) 50-12.5 MG tablet TAKE 1 TABLET BY MOUTH EVERY DAY  . metFORMIN (GLUCOPHAGE) 1000 MG tablet TAKE 1 TABLET BY MOUTH TWICE A DAY FOR DIABETES  . omeprazole (PRILOSEC) 20 MG capsule Take 1 capsule (20 mg total) by mouth daily.  . simvastatin (ZOCOR) 40 MG tablet TAKE 1 TABLET BY MOUTH EVERYDAY AT BEDTIME   No facility-administered encounter medications on file as of 03/24/2020.    Allergies (verified) Azithromycin, Codeine, and Penicillins   History: Past Medical History:  Diagnosis Date  . Allergy   . Arthritis   . Cancer ([facility] 2011-Right    DCIS. 9 mm histologic grade 2 invasive mammary carcinoma, ER 90%, PR 5%, HER-2/neu not over expressed. Sentinel nodes negative.  . Chicken pox   . Diabetes mellitus without complication ([facility]   . Fractured elbow   . History of lumpectomy 2011   Right breast  . Hyperlipidemia   . Hypertension 2001  . Personal history of chemotherapy 2011   Right Breast  . Personal history of radiation therapy 2011   Right breast  . Radial head fracture, closed  12/19/2016   Past Surgical History:  Procedure Laterality Date  . BREAST BIOPSY Right 2011   Invasive Ductal Carcinoma  . BREAST LUMPECTOMY Right 2011  . BREAST SURGERY Right 2011    right breast wide excision, partial breast radiation.  . COLONOSCOPY  Declines   Reports she has annual stool fecal occult blood testing.  [name] DILATION AND CURETTAGE OF UTERUS  1990  . PORT-A-CATH REMOVAL  2012  . right breast wide  excision  2011  . WRIST SURGERY  1998   pins placed   Family History  Problem Relation Age of Onset  . Arthritis Mother   . Alzheimer's disease Mother   . Arthritis Father   . Heart disease Father   . Arthritis Maternal Grandmother   . Breast cancer Maternal Grandmother   . Alzheimer's disease Maternal Grandmother   . Stroke Maternal Grandfather    Social History   Socioeconomic History  . Marital status: Widowed    Spouse name: Not on file  . Number of children: 1  . Years of education: Not on file  . Highest education level: Bachelor's degree (e.g., BA, AB, BS)  Occupational History  . Occupation: retired  Tobacco Use  . Smoking status: Never Smoker  . Smokeless tobacco: Never Used  Vaping Use  . Vaping Use: Never used  Substance and Sexual Activity  . Alcohol use: No  . Drug use: No  . Sexual activity: Not on file  Other Topics Concern  . Not on file  Social History Narrative  . Not on file   Social Determinants of Health   Financial Resource Strain: Low Risk   . Difficulty of Paying Living Expenses: Not hard at all  Food Insecurity: No Food Insecurity  . Worried About Charity fundraiser in the Last Year: Never true  . Ran Out of Food in the Last Year: Never true  Transportation Needs: No Transportation Needs  . Lack of Transportation (Medical): No  . Lack of Transportation (Non-Medical): No  Physical Activity: Inactive  . Days of Exercise per Week: 0 days  . Minutes of Exercise per Session: 0 min  Stress: No Stress Concern Present  . Feeling of Stress : Only a little  Social Connections: Moderately Isolated  . Frequency of Communication with Friends and Family: More than three times a week  . Frequency of Social Gatherings with Friends and Family: More than three times a week  . Attends Religious Services: More than 4 times per year  . Active Member of Clubs or Organizations: No  . Attends Archivist Meetings: Never  . Marital Status: Widowed     Tobacco Counseling Counseling given: Not Answered   Clinical Intake:  Pre-visit preparation completed: Yes  Pain : No/denies pain Pain Score: 0-No pain     Nutritional Risks: None Diabetes: Yes  How often do you need to have someone help you when you read instructions, pamphlets, or other written materials from your doctor or pharmacy?: 1 - Never  Diabetic? Yes  Nutrition Risk Assessment:  Has the patient had any N/V/D within the last 2 months?  No  Does the patient have any non-healing wounds?  No  Has the patient had any unintentional weight loss or weight gain?  No   Diabetes:  Is the patient diabetic?  Yes  If diabetic, was a CBG obtained today?  No  Did the patient bring in their glucometer from home?  No  How often do you monitor your  CBG's? Does not check BS.   Financial Strains and Diabetes Management:  Are you having any financial strains with the device, your supplies or your medication? No .  Does the patient want to be seen by Chronic Care Management for management of their diabetes?  No  Would the patient like to be referred to a Nutritionist or for Diabetic Management?  No   Diabetic Exams:  Diabetic Eye Exam: Completed 08/04/19. Diabetic Foot Exam: Overdue, Pt has been advised about the importance in completing this exam. Note made to follow up on this at next in office apt.    Interpreter Needed?: No  Information entered by :: [email or web address]   Activities of Daily Living In your present state of health, do you have any difficulty performing the following activities: 03/24/2020  Hearing? N  Vision? N  Difficulty concentrating or making decisions? N  Walking or climbing stairs? N  Dressing or bathing? N  Doing errands, shopping? N  Preparing Food and eating ? N  Using the Toilet? N  In the past six months, have you accidently leaked urine? Y  Comment Occasionally  Do you have problems with loss of bowel control? N  Managing your  Medications? N  Managing your Finances? N  Housekeeping or managing your Housekeeping? N  Some recent data might be hidden    Patient Care Team: Birdie Sons, MD as PCP - General (Family Medicine) Cammie Sickle, MD as Consulting Physician (Internal Medicine) [facility], [facility]) Bary Castilla, Forest Gleason, MD as Consulting Physician (General Surgery)  Indicate any recent Medical Services you may have received from other than [facility] providers in the past year (date may be approximate).     Assessment:   This is a routine wellness examination for [place].  Hearing/Vision screen No exam data present  Dietary issues and exercise activities discussed: Current Exercise Habits: The patient does not participate in regular exercise at present, Exercise limited by: Other - see comments (Pt is her mothers caregiver.)  Goals    . Exercise 3x per week (30 min per time)     Recommend to start exercising 3 days a week for 30 minutes.       Depression Screen PHQ 2/9 Scores 03/24/2020 03/24/2019 12/06/2018 10/15/2017 06/11/2017  PHQ - 2 Score 0 0 0 2 1  PHQ- 9 Score - - - 4 5    Fall Risk Fall Risk  03/24/2020 03/24/2019 10/15/2017 06/11/2017  Falls in the past year? 0 0 Yes Yes  Number falls in past yr: 0 - 1 1  Comment - - tripped -  Injury with Fall? 0 - Yes Yes  Comment - - fractured elbow -  Follow up - - Falls prevention discussed Falls prevention discussed    Any stairs in or around the home? Yes  If so, are there any without handrails? No  Home free of loose throw rugs in walkways, pet beds, electrical cords, etc? Yes  Adequate lighting in your home to reduce risk of falls? Yes   ASSISTIVE DEVICES UTILIZED TO PREVENT FALLS:  Life alert? No  Use of a cane, walker or w/c? No  Grab bars in the bathroom? No  Shower chair or bench in shower? Yes  Elevated toilet seat or a handicapped toilet? No    Cognitive Function:     6CIT Screen 03/24/2020 03/24/2019 10/15/2017    What Year? 0 points 0 points 4 points  What month? 0 points 0 points 0 points  What time? 0 points 0 points 0 points  Count back from 20 0 points 0 points 0 points  Months in reverse 2 points 0 points 0 points  Repeat phrase 2 points 2 points 2 points  Total Score _0 Immunizations Immunization History  Administered Date(s) Administered  . Influenza, High Dose Seasonal PF 06/11/2017, 06/04/2018  . Influenza-Unspecified 05/22/2019  . PFIZER SARS-COV-2 Vaccination 10/01/2019, 10/22/2019  . Pneumococcal Conjugate-13 02/07/2016  . Pneumococcal Polysaccharide-23 06/11/2017  . Tdap 12/04/2011  . Zoster 09/11/2013    TDAP status: Up to date Flu Vaccine status: Due fall 2021 Pneumococcal vaccine status: Up to date Covid-19 vaccine status: Completed vaccines  Qualifies for Shingles Vaccine? Yes   Zostavax completed Yes   Shingrix Completed?: No.    Education has been provided regarding the importance of this vaccine. Patient has been advised to call insurance company to determine out of pocket expense if they have not yet received this vaccine. Advised may also receive vaccine at local pharmacy or Health Dept. Verbalized acceptance and understanding.  Screening Tests Health Maintenance  Topic Date Due  . FOOT EXAM  Never done  . INFLUENZA VACCINE  02/29/2020  . Fecal DNA (Cologuard)  04/05/2020  . HEMOGLOBIN A1C  07/14/2020  . OPHTHALMOLOGY EXAM  08/03/2020  . TETANUS/TDAP  12/03/2021  . MAMMOGRAM  02/09/2022  . DEXA SCAN  Completed  . COVID-19 Vaccine  Completed  . Hepatitis C Screening  Completed  . PNA vac Low Risk Adult  Completed    Health Maintenance  Health Maintenance Due  Topic Date Due  . FOOT EXAM  Never done  . INFLUENZA VACCINE  02/29/2020    Colorectal cancer screening: Cologuard completed 04/05/17. Repeat every 3 years. Pt has an apt scheduled with Dr Bary Castilla to discuss a colonoscopy.  Mammogram status: Completed 02/10/20. Repeat every year Bone  Density status: Completed 11/21/27. Results reflect: Bone density results: NORMAL. Repeat dexa scheduled 03/31/20.  Lung Cancer Screening: (Low Dose CT Chest recommended if Age 49-80 years, 30 pack-year currently smoking OR have quit w/in 15years.) does not qualify.   Additional Screening:  Hepatitis C Screening: Up to date  Vision Screening: Recommended annual ophthalmology exams for early detection of glaucoma and other disorders of the eye. Is the patient up to date with their annual eye exam?  Yes  Who is the provider or what is the name of the office in which the patient attends annual eye exams? [facility] If pt is not established with a provider, would they like to be referred to a provider to establish care? No .   Dental Screening: Recommended annual dental exams for proper oral hygiene  Community Resource Referral / Chronic Care Management: CRR required this visit?  No   CCM required this visit?  No      Plan:     I have personally reviewed and noted the following in the patient's chart:   . Medical and social history . Use of alcohol, tobacco or illicit drugs  . Current medications and supplements . Functional ability and status . Nutritional status . Physical activity . Advanced directives . List of other physicians . Hospitalizations, surgeries, and ER visits in previous 12 months . Vitals . Screenings to include cognitive, depression, and falls . Referrals and appointments  In addition, I have reviewed and discussed with patient certain preventive protocols, quality metrics, and best practice recommendations. A written personalized care plan for preventive services as well as  general preventive health recommendations were provided to patient.     Violia Knopf [place], [place]   2[birth year]59   Nurse Notes: Pt needs a diabetic foot exam at next in office apt.

## 2020-03-24 ENCOUNTER — Ambulatory Visit (INDEPENDENT_AMBULATORY_CARE_PROVIDER_SITE_OTHER): Payer: Medicare HMO

## 2020-03-24 ENCOUNTER — Other Ambulatory Visit: Payer: Self-pay

## 2020-03-24 DIAGNOSIS — Z0000 Encounter for general adult medical examination without abnormal findings: Secondary | ICD-10-CM | POA: Diagnosis not present

## 2020-03-24 NOTE — Patient Instructions (Signed)
Hannah [name] , Thank you for taking time to come for your Medicare Wellness Visit. I appreciate your ongoing commitment to your health goals. Please review the following plan we discussed and let me know if I can assist you in the future.   Screening recommendations/referrals: Colonoscopy: Cologuard up to date, due 04/01/20 Mammogram: Up to date, due 01/2021 Bone Density: Previous DEXA scan was normal. No repeat needed unless advised by a physician. Recommended yearly ophthalmology/optometry visit for glaucoma screening and checkup Recommended yearly dental visit for hygiene and checkup  Vaccinations: Influenza vaccine: Due fall 2021 Pneumococcal vaccine: Completed series Tdap vaccine: Up to date, due 11/2021 Shingles vaccine: Shingrix discussed. Please contact your pharmacy for coverage information.     Advanced directives: Advance directive discussed with you today. Even though you declined this today please call our office should you change your mind and we can give you the proper paperwork for you to fill out.  Conditions/risks identified: Pt to start back walking 3 days a week for at least 30 minutes at a time.   Next appointment: 05/17/20 @ 1:40 PM with Dr Caryn Section. Declined scheduling an AWV for 2022 at this time.    Preventive Care 69 Years and Older, Female Preventive care refers to lifestyle choices and visits with your health care provider that can promote health and wellness. What does preventive care include?  A yearly physical exam. This is also called an annual well check.  Dental exams once or twice a year.  Routine eye exams. Ask your health care provider how often you should have your eyes checked.  Personal lifestyle choices, including:  Daily care of your teeth and gums.  Regular physical activity.  Eating a healthy diet.  Avoiding tobacco and drug use.  Limiting alcohol use.  Practicing safe sex.  Taking low-dose aspirin every day.  Taking vitamin and  mineral supplements as recommended by your health care provider. What happens during an annual well check? The services and screenings done by your health care provider during your annual well check will depend on your age, overall health, lifestyle risk factors, and family history of disease. Counseling  Your health care provider may ask you questions about your:  Alcohol use.  Tobacco use.  Drug use.  Emotional well-being.  Home and relationship well-being.  Sexual activity.  Eating habits.  History of falls.  Memory and ability to understand (cognition).  Work and work Statistician.  Reproductive health. Screening  You may have the following tests or measurements:  Height, weight, and BMI.  Blood pressure.  Lipid and cholesterol levels. These may be checked every 5 years, or more frequently if you are over 67 years old.  Skin check.  Lung cancer screening. You may have this screening every year starting at age 56 if you have a 30-pack-year history of smoking and currently smoke or have quit within the past 15 years.  Fecal occult blood test (FOBT) of the stool. You may have this test every year starting at age 53.  Flexible sigmoidoscopy or colonoscopy. You may have a sigmoidoscopy every 5 years or a colonoscopy every 10 years starting at age 69.  Hepatitis C blood test.  Hepatitis B blood test.  Sexually transmitted disease (STD) testing.  Diabetes screening. This is done by checking your blood sugar (glucose) after you have not eaten for a while (fasting). You may have this done every 1-3 years.  Bone density scan. This is done to screen for osteoporosis. You may have this  done starting at age 69.  Mammogram. This may be done every 1-2 years. Talk to your health care provider about how often you should have regular mammograms. Talk with your health care provider about your test results, treatment options, and if necessary, the need for more tests. Vaccines   Your health care provider may recommend certain vaccines, such as:  Influenza vaccine. This is recommended every year.  Tetanus, diphtheria, and acellular pertussis (Tdap, Td) vaccine. You may need a Td booster every 10 years.  Zoster vaccine. You may need this after age 38.  Pneumococcal 13-valent conjugate (PCV13) vaccine. One dose is recommended after age 52.  Pneumococcal polysaccharide (PPSV23) vaccine. One dose is recommended after age 74. Talk to your health care provider about which screenings and vaccines you need and how often you need them. This information is not intended to replace advice given to you by your health care provider. Make sure you discuss any questions you have with your health care provider. Document Released: 08/13/2015 Document Revised: 04/05/2016 Document Reviewed: 05/18/2015 Elsevier Interactive Patient Education  2017 [employer] Prevention in the Home Falls can cause injuries. They can happen to people of all ages. There are many things you can do to make your home safe and to help prevent falls. What can I do on the outside of my home?  Regularly fix the edges of walkways and driveways and fix any cracks.  Remove anything that might make you trip as you walk through a door, such as a raised step or threshold.  Trim any bushes or trees on the path to your home.  Use bright outdoor lighting.  Clear any walking paths of anything that might make someone trip, such as rocks or tools.  Regularly check to see if handrails are loose or broken. Make sure that both sides of any steps have handrails.  Any raised decks and porches should have guardrails on the edges.  Have any leaves, snow, or ice cleared regularly.  Use sand or salt on walking paths during winter.  Clean up any spills in your garage right away. This includes oil or grease spills. What can I do in the bathroom?  Use night lights.  Install grab bars by the toilet and in the  tub and shower. Do not use towel bars as grab bars.  Use non-skid mats or decals in the tub or shower.  If you need to sit down in the shower, use a plastic, non-slip stool.  Keep the floor dry. Clean up any water that spills on the floor as soon as it happens.  Remove soap buildup in the tub or shower regularly.  Attach bath mats securely with double-sided non-slip rug tape.  Do not have throw rugs and other things on the floor that can make you trip. What can I do in the bedroom?  Use night lights.  Make sure that you have a light by your bed that is easy to reach.  Do not use any sheets or blankets that are too big for your bed. They should not hang down onto the floor.  Have a firm chair that has side arms. You can use this for support while you get dressed.  Do not have throw rugs and other things on the floor that can make you trip. What can I do in the kitchen?  Clean up any spills right away.  Avoid walking on wet floors.  Keep items that you use a lot in easy-to-reach places.  If you need to reach something above you, use a strong step stool that has a grab bar.  Keep electrical cords out of the way.  Do not use floor polish or wax that makes floors slippery. If you must use wax, use non-skid floor wax.  Do not have throw rugs and other things on the floor that can make you trip. What can I do with my stairs?  Do not leave any items on the stairs.  Make sure that there are handrails on both sides of the stairs and use them. Fix handrails that are broken or loose. Make sure that handrails are as long as the stairways.  Check any carpeting to make sure that it is firmly attached to the stairs. Fix any carpet that is loose or worn.  Avoid having throw rugs at the top or bottom of the stairs. If you do have throw rugs, attach them to the floor with carpet tape.  Make sure that you have a light switch at the top of the stairs and the bottom of the stairs. If you  do not have them, ask someone to add them for you. What else can I do to help prevent falls?  Wear shoes that:  Do not have high heels.  Have rubber bottoms.  Are comfortable and fit you well.  Are closed at the toe. Do not wear sandals.  If you use a stepladder:  Make sure that it is fully opened. Do not climb a closed stepladder.  Make sure that both sides of the stepladder are locked into place.  Ask someone to hold it for you, if possible.  Clearly mark and make sure that you can see:  Any grab bars or handrails.  First and last steps.  Where the edge of each step is.  Use tools that help you move around (mobility aids) if they are needed. These include:  Canes.  Walkers.  Scooters.  Crutches.  Turn on the lights when you go into a dark area. Replace any light bulbs as soon as they burn out.  Set up your furniture so you have a clear path. Avoid moving your furniture around.  If any of your floors are uneven, fix them.  If there are any pets around you, be aware of where they are.  Review your medicines with your doctor. Some medicines can make you feel dizzy. This can increase your chance of falling. Ask your doctor what other things that you can do to help prevent falls. This information is not intended to replace advice given to you by your health care provider. Make sure you discuss any questions you have with your health care provider. Document Released: 05/13/2009 Document Revised: 12/23/2015 Document Reviewed: 08/21/2014 Elsevier Interactive Patient Education  2017 [employer].

## 2020-03-25 DIAGNOSIS — Z79811 Long term (current) use of aromatase inhibitors: Secondary | ICD-10-CM | POA: Diagnosis not present

## 2020-03-25 DIAGNOSIS — J309 Allergic rhinitis, unspecified: Secondary | ICD-10-CM | POA: Diagnosis not present

## 2020-03-25 DIAGNOSIS — R32 Unspecified urinary incontinence: Secondary | ICD-10-CM | POA: Diagnosis not present

## 2020-03-25 DIAGNOSIS — R69 Illness, unspecified: Secondary | ICD-10-CM | POA: Diagnosis not present

## 2020-03-25 DIAGNOSIS — K219 Gastro-esophageal reflux disease without esophagitis: Secondary | ICD-10-CM | POA: Diagnosis not present

## 2020-03-25 DIAGNOSIS — E785 Hyperlipidemia, unspecified: Secondary | ICD-10-CM | POA: Diagnosis not present

## 2020-03-25 DIAGNOSIS — C50919 Malignant neoplasm of unspecified site of unspecified female breast: Secondary | ICD-10-CM | POA: Diagnosis not present

## 2020-03-25 DIAGNOSIS — E119 Type 2 diabetes mellitus without complications: Secondary | ICD-10-CM | POA: Diagnosis not present

## 2020-03-25 DIAGNOSIS — I10 Essential (primary) hypertension: Secondary | ICD-10-CM | POA: Diagnosis not present

## 2020-03-25 DIAGNOSIS — Z7984 Long term (current) use of oral hypoglycemic drugs: Secondary | ICD-10-CM | POA: Diagnosis not present

## 2020-03-31 ENCOUNTER — Other Ambulatory Visit: Payer: Self-pay

## 2020-03-31 ENCOUNTER — Ambulatory Visit
Admission: RE | Admit: 2020-03-31 | Discharge: 2020-03-31 | Disposition: A | Discharge status: Home / Self Care | Payer: Medicare HMO | Source: Ambulatory Visit | Attending: Internal Medicine | Admitting: Internal Medicine

## 2020-03-31 DIAGNOSIS — Z79811 Long term (current) use of aromatase inhibitors: Secondary | ICD-10-CM | POA: Insufficient documentation

## 2020-03-31 DIAGNOSIS — M85851 Other specified disorders of bone density and structure, right thigh: Secondary | ICD-10-CM | POA: Diagnosis not present

## 2020-03-31 DIAGNOSIS — C50811 Malignant neoplasm of overlapping sites of right female breast: Secondary | ICD-10-CM | POA: Diagnosis not present

## 2020-03-31 DIAGNOSIS — Z853 Personal history of malignant neoplasm of breast: Secondary | ICD-10-CM | POA: Diagnosis not present

## 2020-03-31 DIAGNOSIS — Z13820 Encounter for screening for osteoporosis: Secondary | ICD-10-CM | POA: Diagnosis not present

## 2020-03-31 DIAGNOSIS — Z780 Asymptomatic menopausal state: Secondary | ICD-10-CM | POA: Diagnosis not present

## 2020-04-02 ENCOUNTER — Other Ambulatory Visit: Payer: Self-pay | Admitting: Family Medicine

## 2020-04-13 ENCOUNTER — Telehealth: Payer: Self-pay | Admitting: Internal Medicine

## 2020-04-13 NOTE — Telephone Encounter (Signed)
On 9/13= spoke to patient regarding results of bone density normal.  Patient awaiting evaluation with Dr. Tollie Pizza the next few weeks.  GB

## 2020-04-20 ENCOUNTER — Other Ambulatory Visit: Payer: Self-pay | Admitting: Internal Medicine

## 2020-04-20 DIAGNOSIS — Z79811 Long term (current) use of aromatase inhibitors: Secondary | ICD-10-CM

## 2020-04-20 DIAGNOSIS — C50811 Malignant neoplasm of overlapping sites of right female breast: Secondary | ICD-10-CM

## 2020-05-14 NOTE — Progress Notes (Signed)
Established patient visit   Patient: Hannah [name]   DOB: 06/04/1951   69 y.o. Female  MRN: [id number] Visit Date: 05/17/2020  Today's healthcare provider: Lelon Huh, MD   No chief complaint on file.  Subjective    HPI  Diabetes Mellitus Type II, Follow-up  Lab Results  Component Value Date   HGBA1C 7.8 (A) 01/13/2020   HGBA1C 7.0 (A) 07/15/2019   HGBA1C 7.4 (A) 12/06/2018   Wt Readings from Last 3 Encounters:  03/17/20 146 lb 12.8 oz (66.6 kg)  01/13/20 149 lb 6.4 oz (67.8 kg)  07/15/19 150 lb 6.4 oz (68.2 kg)   Last seen for diabetes 4 months ago.  Management since then includes patient advised to be more strict with diet. She reports fair compliance with treatment. She is not having side effects.  Symptoms: No fatigue No foot ulcerations  No appetite changes No nausea  No paresthesia of the feet  No polydipsia  No polyuria No visual disturbances   No vomiting     Home blood sugar records: fasting range: not being checked at home  Episodes of hypoglycemia? No    Current insulin regiment: none Most Recent Eye Exam: 08/04/2019 Current exercise: walking Current diet habits: in general, an "unhealthy" diet  Pertinent Labs: Lab Results  Component Value Date   CHOL 162 01/14/2020   HDL 76 01/14/2020   LDLCALC 68 01/14/2020   LDLDIRECT 74.0 02/07/2016   TRIG 99 01/14/2020   CHOLHDL 2.1 01/14/2020   Lab Results  Component Value Date   NA 136 03/17/2020   K 4.5 03/17/2020   CREATININE 0.90 03/17/2020   GFRNONAA >60 03/17/2020   GFRAA >60 03/17/2020   GLUCOSE 199 (H) 03/17/2020     ---------------------------------------------------------------------------------------------------      Medications: Outpatient Medications Prior to Visit  Medication Sig  . anastrozole (ARIMIDEX) 1 MG tablet TAKE 1 TABLET BY MOUTH EVERY DAY  . carvedilol (COREG) 12.5 MG tablet TAKE 1 TABLET BY MOUTH 2 TIMES DAILY.  [name] Cholecalciferol (VITAMIN D3) 1000 UNITS  CAPS Take 1 capsule by mouth daily.   [name] FLUoxetine (PROZAC) 40 MG capsule TAKE 1 CAPSULE BY MOUTH EVERY DAY  . fluticasone (FLONASE) 50 MCG/ACT nasal spray SPRAY 2 SPRAYS INTO EACH NOSTRIL EVERY DAY (Patient taking differently: As needed)  . glipiZIDE (GLUCOTROL XL) 2.5 MG 24 hr tablet TAKE 1 TABLET BY MOUTH ONCE DAILY FOR DIABETES  . losartan-hydrochlorothiazide (HYZAAR) 50-12.5 MG tablet TAKE 1 TABLET BY MOUTH EVERY DAY  . metFORMIN (GLUCOPHAGE) 1000 MG tablet TAKE 1 TABLET BY MOUTH TWICE A DAY FOR DIABETES  . omeprazole (PRILOSEC) 20 MG capsule Take 1 capsule (20 mg total) by mouth daily.  . simvastatin (ZOCOR) 40 MG tablet TAKE 1 TABLET BY MOUTH EVERYDAY AT BEDTIME   No facility-administered medications prior to visit.    Review of Systems  Constitutional: Negative.   Respiratory: Negative.   Cardiovascular: Negative.   Musculoskeletal: Negative.       Objective    BP 122/69 (BP Location: Left Arm, Patient Position: Sitting, Cuff Size: Normal)   Pulse 88   Temp 98.2 F (36.8 C) (Oral)   Ht 5' (1.524 m)   Wt 147 lb 9.6 oz (67 kg)   LMP 08/30/2003   SpO2 99%   BMI 28.83 kg/m    Physical Exam   General: Appearance:     Well developed, well nourished female in no acute distress  Eyes:    PERRL, conjunctiva/corneas clear, EOM's intact  Lungs:     Clear to auscultation bilaterally, respirations unlabored  Heart:    Normal heart rate. Normal rhythm. No murmurs, rubs, or gallops.   MS:   All extremities are intact.   Neurologic:   Awake, alert, oriented x 3. No apparent focal neurological           defect.           Assessment & Plan     1. Type 2 diabetes mellitus without complication, without long-term current use of insulin (HCC) Doing well on current medications.  - HgB A1c  2. Need for influenza vaccination  - Flu Vaccine QUAD High Dose(Fluad)  3. Hypertension Well controlled.  Continue current medications.    4. Colon cancer screening Is due for  screening, she reports she has appt scheduled with Dr. Bary Castilla regarding colonoscopy.         The entirety of the information documented in the History of Present Illness, Review of Systems and Physical Exam were personally obtained by me. Portions of this information were initially documented by the CMA and reviewed by me for thoroughness and accuracy.      Lelon Huh, MD  [facility] [phone] (phone) [phone] (fax)  [place]

## 2020-05-17 ENCOUNTER — Other Ambulatory Visit: Payer: Self-pay

## 2020-05-17 ENCOUNTER — Ambulatory Visit (INDEPENDENT_AMBULATORY_CARE_PROVIDER_SITE_OTHER): Payer: Medicare HMO | Admitting: Family Medicine

## 2020-05-17 ENCOUNTER — Ambulatory Visit: Payer: Medicare HMO | Admitting: Family Medicine

## 2020-05-17 ENCOUNTER — Encounter: Payer: Self-pay | Admitting: Family Medicine

## 2020-05-17 VITALS — BP 122/69 | HR 88 | Temp 98.2°F | Ht 60.0 in | Wt 147.6 lb

## 2020-05-17 DIAGNOSIS — I10 Essential (primary) hypertension: Secondary | ICD-10-CM | POA: Diagnosis not present

## 2020-05-17 DIAGNOSIS — E119 Type 2 diabetes mellitus without complications: Secondary | ICD-10-CM

## 2020-05-17 DIAGNOSIS — Z23 Encounter for immunization: Secondary | ICD-10-CM | POA: Diagnosis not present

## 2020-05-17 DIAGNOSIS — Z1211 Encounter for screening for malignant neoplasm of colon: Secondary | ICD-10-CM

## 2020-05-18 ENCOUNTER — Telehealth: Payer: Self-pay

## 2020-05-18 DIAGNOSIS — E119 Type 2 diabetes mellitus without complications: Secondary | ICD-10-CM

## 2020-05-18 LAB — HEMOGLOBIN A1C
Est. average glucose Bld gHb Est-mCnc: 180 mg/dL
Hgb A1c MFr Bld: 7.9 % — ABNORMAL HIGH (ref 4.8–5.6)

## 2020-05-18 MED ORDER — GLIPIZIDE ER 5 MG PO TB24: 5.0000 mg | ORAL_TABLET | Freq: Every day | ORAL | 3 refills | Status: DC

## 2020-05-18 NOTE — Telephone Encounter (Signed)
-----   Message from Birdie Sons, MD sent at 05/18/2020  1:11 PM EDT ----- A1c is up to 7.9. need to increase glipizide XL to 5mg  once a day, #30, rf x 3 and schedule follow up in 12 weeks.

## 2020-05-18 NOTE — Telephone Encounter (Signed)
Patient advised and verbalized understanding. Prescription sent into pharmacy.Follow up appointment scheduled. °

## 2020-05-25 ENCOUNTER — Telehealth: Payer: Self-pay | Admitting: *Deleted

## 2020-05-25 NOTE — Chronic Care Management (AMB) (Signed)
Chronic Care Management  ° °Note ° °05/25/2020 °Name: Marlyss P Doody MRN: [id number] DOB: 01/12/1951 ° °Loreda P Tunney is a 69 y.o. year old female who is a primary care patient of Fisher, Donald E, MD. I reached out to Keoni P Mccaig by phone today in response to a referral sent by Ms. Avilyn P Korenek's health plan.    ° °Ms. Kirsh was given information about Chronic Care Management services today including:  °1. CCM service includes personalized support from designated clinical staff supervised by her physician, including individualized plan of care and coordination with other care providers °2. 24/7 contact phone numbers for assistance for urgent and routine care needs. °3. Service will only be billed when office clinical staff spend 20 minutes or more in a month to coordinate care. °4. Only one practitioner may furnish and bill the service in a calendar month. °5. The patient may stop CCM services at any time (effective at the end of the month) by phone call to the office staff. °6. The patient will be responsible for cost sharing (co-pay) of up to 20% of the service fee (after annual deductible is met). ° °Patient agreed to services and verbal consent obtained.  ° °Follow up plan: °Telephone appointment with care management team member scheduled for:06/03/2020 ° °Stacey Snead  °Care Guide, Embedded Care Coordination °[place]   Care Management  °Direct Dial: [phone] ° °

## 2020-05-26 ENCOUNTER — Telehealth: Payer: Self-pay

## 2020-05-26 NOTE — Telephone Encounter (Signed)
Copied from [place] [id number]. Topic: Quick Communication - Rx Refill/Question >> May 26, 2020  2:45 PM Erick Blinks wrote: Pt states that she gets "shaky and trembly" whenever she takes the glipizide once a day, she wants to resume her previous dosage. Please advise   Best contact: [phone]

## 2020-05-26 NOTE — Telephone Encounter (Signed)
Her a1c was high, if she thinks she can improve her diet and get more exercise then, she can go back to 2.5mg  glipizide ER.

## 2020-05-30 ENCOUNTER — Other Ambulatory Visit: Payer: Self-pay | Admitting: Family Medicine

## 2020-05-30 DIAGNOSIS — C50911 Malignant neoplasm of unspecified site of right female breast: Secondary | ICD-10-CM

## 2020-05-30 NOTE — Telephone Encounter (Signed)
Requested Prescriptions  Pending Prescriptions Disp Refills  . carvedilol (COREG) 12.5 MG tablet [Pharmacy Med Name: CARVEDILOL 12.5 MG TABLET] 180 tablet 0    Sig: TAKE 1 TABLET BY MOUTH TWICE A DAY     Cardiovascular:  Beta Blockers Passed - 05/30/2020  8:55 AM      Passed - Last BP in normal range    BP Readings from Last 1 Encounters:  05/17/20 122/69         Passed - Last Heart Rate in normal range    Pulse Readings from Last 1 Encounters:  05/17/20 88         Passed - Valid encounter within last 6 months    Recent Outpatient Visits          1 week ago Type 2 diabetes mellitus without complication, without long-term current use of insulin ([place])   [facility] Birdie Sons, MD   4 months ago Type 2 diabetes mellitus without complication, without long-term current use of insulin ([place])   [facility] Birdie Sons, MD   9 months ago Acute non-recurrent frontal sinusitis   [facility] Birdie Sons, MD   10 months ago Type 2 diabetes mellitus without complication, without long-term current use of insulin [facility])   [facility] Birdie Sons, MD   1 year ago Type 2 diabetes mellitus without complication, without long-term current use of insulin [facility])   [facility] Birdie Sons, MD      Future Appointments            In 2 months Fisher, Kirstie Peri, MD [facility], [place]

## 2020-06-01 NOTE — Telephone Encounter (Signed)
I called and spoke with patient. She states she is tolerating the medication much better now and no longer feels shaky.

## 2020-06-03 ENCOUNTER — Ambulatory Visit: Payer: Medicare HMO

## 2020-06-03 DIAGNOSIS — E785 Hyperlipidemia, unspecified: Secondary | ICD-10-CM

## 2020-06-03 DIAGNOSIS — I10 Essential (primary) hypertension: Secondary | ICD-10-CM

## 2020-06-03 DIAGNOSIS — E119 Type 2 diabetes mellitus without complications: Secondary | ICD-10-CM

## 2020-06-03 NOTE — Chronic Care Management (AMB) (Signed)
Chronic Care Management   Initial Visit Note  06/03/2020 Name: Hannah [name] MRN: [id number] DOB: 22-Jul-1951  Primary Care Provider: Birdie Sons, MD Reason for referral : Chronic Care Management   Hannah [name] is a 69 y.o. year old female who is a primary care patient of Fisher, Kirstie Peri, MD. The CCM team was consulted for assistance with chronic disease management and care coordination. A telephonic assessment was conducted today.  Review of Hannah [name]'s status, including review of consultants reports, relevant labs and test results was conducted today. Collaboration with appropriate care team members was performed as part of the comprehensive evaluation and provision of chronic care management services.     SDOH (Social Determinants of Health) assessments performed: Yes SDOH Interventions     Most Recent Value  SDOH Interventions  Food Insecurity Interventions Intervention Not Indicated  Transportation Interventions Intervention Not Indicated        Medications: Outpatient Encounter Medications as of 06/03/2020  Medication Sig  . anastrozole (ARIMIDEX) 1 MG tablet TAKE 1 TABLET BY MOUTH EVERY DAY  . carvedilol (COREG) 12.5 MG tablet TAKE 1 TABLET BY MOUTH TWICE A DAY  . Cholecalciferol (VITAMIN D3) 1000 UNITS CAPS Take 1 capsule by mouth daily.   [name] FLUoxetine (PROZAC) 40 MG capsule TAKE 1 CAPSULE BY MOUTH EVERY DAY  . fluticasone (FLONASE) 50 MCG/ACT nasal spray SPRAY 2 SPRAYS INTO EACH NOSTRIL EVERY DAY (Patient taking differently: As needed)  . glipiZIDE (GLUCOTROL XL) 5 MG 24 hr tablet Take 1 tablet (5 mg total) by mouth daily with breakfast.  . losartan-hydrochlorothiazide (HYZAAR) 50-12.5 MG tablet TAKE 1 TABLET BY MOUTH EVERY DAY  . metFORMIN (GLUCOPHAGE) 1000 MG tablet TAKE 1 TABLET BY MOUTH TWICE A DAY FOR DIABETES  . omeprazole (PRILOSEC) 20 MG capsule Take 1 capsule (20 mg total) by mouth daily.  . simvastatin (ZOCOR) 40 MG tablet TAKE 1 TABLET BY MOUTH  EVERYDAY AT BEDTIME   No facility-administered encounter medications on file as of 06/03/2020.     Objective:  BP Readings from Last 3 Encounters:  05/17/20 122/69  03/17/20 133/86  01/13/20 122/82    Lab Results  Component Value Date   CHOL 162 01/14/2020   HDL 76 01/14/2020   LDLCALC 68 01/14/2020   LDLDIRECT 74.0 02/07/2016   TRIG 99 01/14/2020   CHOLHDL 2.1 01/14/2020   Lab Results  Component Value Date   HGBA1C 7.9 (H) 05/17/2020    Goals Addressed            This Visit's Progress   . Chronic Disease Management       CARE PLAN ENTRY (see longitudinal plan of care for additional care plan information)  Current Barriers:  . Chronic Disease Management support and education needs related to HTN, HLD, DM and GERD.  Nurse Case Manager Clinical Goal(s):  Over the next 120 days, patient: . Will not require hospitalization or emergent care d/t complications r/t chronic illnesses. Over the next 90 days, patient: . Will attend all scheduled medical appointments. . Will take all medications as scheduled. . Will monitor BP and record readings. . Will adhere to recommended cardiac prudent/diabetic diet. . Will follow recommended safety precautions to prevent falls and injuries.  Over the next 30 days, patient: . Follow up with the surgical team as scheduled regarding colonoscopy.   Interventions:  . Inter-disciplinary care team collaboration (see longitudinal plan of care) . Reviewed medications. Encouraged to take all medications as prescribed and notify provider if unable  to tolerate prescribed regimen. Encouraged to notify care management team with concerns regarding medication management or prescription cost.  . Provided education regarding established BP parameters and indications for notifying her provider. Encouraged to monitor BP a few times a week if unable to monitor daily and record readings.  . Discussed plan regarding diabetes management and adherence to  recommended diet. Most recent A1C =7.9%. Reports not monitoring fasting blood glucose levels. Discussed importance of reading nutritional labels. Currently managing via medications and diet. Reports completing recommended foot care and performing daily checks. Reports completing recommended annual eye exam in March.  . Provided information regarding safety and fall prevention measures. Encourage to use caution when ambulating. Advised to keep pathways clear and well lit. Reports ambulating well. Attempts to light walks daily. Denies recent falls.   . Reviewed scheduled/pending appointments. Encouraged to attend medical appointments as scheduled to prevent delays in care. Encouraged to notify care management team with concerns regarding transportation. Pending outreach with the surgical team on 06/08/20 to discuss plan/schedule a colonoscopy. Denies concerns regarding transportation.   . Discussed plans for ongoing care management follow up. Provided direct contact information for CCM Nurse Case Manager.   Patient Self Care Activities:  . Self administers medications . Attends scheduled provider appointments . Calls pharmacy for medication refills . Attends church or other social activities . Performs ADL's independently . Performs IADL's independently . Calls provider office for new concerns or questions  Initial goal documentation         Hannah [name] was given information about Chronic Care Management services  including:  1. CCM service includes personalized support from designated clinical staff supervised by her physician, including individualized plan of care and coordination with other care providers 2. 24/7 contact phone numbers for assistance for urgent and routine care needs. 3. Service will only be billed when office clinical staff spend 20 minutes or more in a month to coordinate care. 4. Only one practitioner may furnish and bill the service in a calendar month. 5. The patient  may stop CCM services at any time (effective at the end of the month) by phone call to the office staff. 6. The patient will be responsible for cost sharing (co-pay) of up to 20% of the service fee (after annual deductible is met).  Patient agreed to services and verbal consent obtained.     PLAN A member of the care management team will follow-up with Hannah [name] next month.   [name] Care Management [facility] [phone]

## 2020-06-08 DIAGNOSIS — Z853 Personal history of malignant neoplasm of breast: Secondary | ICD-10-CM | POA: Diagnosis not present

## 2020-06-08 DIAGNOSIS — Z1211 Encounter for screening for malignant neoplasm of colon: Secondary | ICD-10-CM | POA: Diagnosis not present

## 2020-06-14 NOTE — Patient Instructions (Signed)
Thank you for allowing the Chronic Care Management team to participate in your care.   Goals Addressed            This Visit's Progress   . Chronic Disease Management       CARE PLAN ENTRY (see longitudinal plan of care for additional care plan information)  Current Barriers:  . Chronic Disease Management support and education needs related to HTN, HLD, DM and GERD.  Nurse Case Manager Clinical Goal(s):  Over the next 120 days, patient: . Will not require hospitalization or emergent care d/t complications r/t chronic illnesses. Over the next 90 days, patient: . Will attend all scheduled medical appointments. . Will take all medications as scheduled. . Will monitor BP and record readings. . Will adhere to recommended cardiac prudent/diabetic diet. . Will follow recommended safety precautions to prevent falls and injuries.  Over the next 30 days, patient: . Follow up with the surgical team as scheduled regarding colonoscopy.   Interventions:  . Inter-disciplinary care team collaboration (see longitudinal plan of care) . Reviewed medications. Encouraged to take all medications as prescribed and notify provider if unable to tolerate prescribed regimen. Encouraged to notify care management team with concerns regarding medication management or prescription cost.  . Provided education regarding established BP parameters and indications for notifying her provider. Encouraged to monitor BP a few times a week if unable to monitor daily and record readings.  . Discussed plan regarding diabetes management and adherence to recommended diet. Most recent A1C =7.9%. Reports not monitoring fasting blood glucose levels. Discussed importance of reading nutritional labels. Currently managing via medications and diet. Reports completing recommended foot care and performing daily checks. Reports completing recommended annual eye exam in March.  . Provided information regarding safety and fall  prevention measures. Encourage to use caution when ambulating. Advised to keep pathways clear and well lit. Reports ambulating well. Attempts to light walks daily. Denies recent falls.   . Reviewed scheduled/pending appointments. Encouraged to attend medical appointments as scheduled to prevent delays in care. Encouraged to notify care management team with concerns regarding transportation. Pending outreach with the surgical team on 06/08/20 to discuss plan/schedule a colonoscopy. Denies concerns regarding transportation.   . Discussed plans for ongoing care management follow up. Provided direct contact information for CCM Nurse Case Manager.   Patient Self Care Activities:  . Self administers medications . Attends scheduled provider appointments . Calls pharmacy for medication refills . Attends church or other social activities . Performs ADL's independently . Performs IADL's independently . Calls provider office for new concerns or questions  Initial goal documentation        Ms. Chaplin was given information about Chronic Care Management services  including:  1. CCM service includes personalized support from designated clinical staff supervised by her physician, including individualized plan of care and coordination with other care providers 2. 24/7 contact phone numbers for assistance for urgent and routine care needs. 3. Service will only be billed when office clinical staff spend 20 minutes or more in a month to coordinate care. 4. Only one practitioner may furnish and bill the service in a calendar month. 5. The patient may stop CCM services at any time (effective at the end of the month) by phone call to the office staff. 6. The patient will be responsible for cost sharing (co-pay) of up to 20% of the service fee (after annual deductible is met).  Patient agreed to services and verbal consent obtained.  Ms. Bordeau verbalized understanding of the information discussed during  the telephonic outreach today. Declined need for a mailed/printed copy of the information.    A member of the care management team will follow-up with Ms. Fouty next month.   [name] Care Management [facility] [phone]

## 2020-06-16 ENCOUNTER — Ambulatory Visit: Payer: Medicare HMO | Admitting: Dermatology

## 2020-07-02 ENCOUNTER — Other Ambulatory Visit: Payer: Self-pay | Admitting: Family Medicine

## 2020-07-02 LAB — COLOGUARD

## 2020-07-09 DIAGNOSIS — Z1211 Encounter for screening for malignant neoplasm of colon: Secondary | ICD-10-CM | POA: Diagnosis not present

## 2020-07-09 LAB — COLOGUARD: Cologuard: NEGATIVE

## 2020-07-15 ENCOUNTER — Other Ambulatory Visit: Payer: Self-pay | Admitting: Family Medicine

## 2020-07-15 DIAGNOSIS — K219 Gastro-esophageal reflux disease without esophagitis: Secondary | ICD-10-CM

## 2020-07-18 LAB — EXTERNAL GENERIC LAB PROCEDURE: COLOGUARD: NEGATIVE

## 2020-07-18 LAB — COLOGUARD: COLOGUARD: NEGATIVE

## 2020-07-27 ENCOUNTER — Encounter: Payer: Self-pay | Admitting: Family Medicine

## 2020-07-27 ENCOUNTER — Ambulatory Visit (INDEPENDENT_AMBULATORY_CARE_PROVIDER_SITE_OTHER): Payer: Medicare HMO

## 2020-07-27 DIAGNOSIS — E119 Type 2 diabetes mellitus without complications: Secondary | ICD-10-CM | POA: Diagnosis not present

## 2020-07-27 NOTE — Chronic Care Management (AMB) (Signed)
Chronic Care Management   Follow Up Note   07/27/2020 Name: Hannah [name] MRN: [id number] DOB: 1951-07-18  Primary Care Provider: Malva Limes, MD Reason for referral : Chronic Care Management  Hannah [name] is a 69 y.o. year old female who is a primary care patient of Sherrie Mustache, Demetrios Isaacs, MD. She is currently engaged with the chronic care management team.  Review of Hannah [name] status, including review of consultants reports, relevant labs and test results was conducted today. Collaboration with appropriate care team members was performed as part of the comprehensive evaluation and provision of chronic care management services.    SDOH (Social Determinants of Health) assessments performed: No    Outpatient Encounter Medications as of 07/27/2020  Medication Sig  . anastrozole (ARIMIDEX) 1 MG tablet TAKE 1 TABLET BY MOUTH EVERY DAY  . carvedilol (COREG) 12.5 MG tablet TAKE 1 TABLET BY MOUTH TWICE A DAY  . Cholecalciferol (VITAMIN D3) 1000 UNITS CAPS Take 1 capsule by mouth daily.   [name] FLUoxetine (PROZAC) 40 MG capsule TAKE 1 CAPSULE BY MOUTH EVERY DAY  . fluticasone (FLONASE) 50 MCG/ACT nasal spray SPRAY 2 SPRAYS INTO EACH NOSTRIL EVERY DAY (Patient taking differently: As needed)  . glipiZIDE (GLUCOTROL XL) 5 MG 24 hr tablet Take 1 tablet (5 mg total) by mouth daily with breakfast.  . losartan-hydrochlorothiazide (HYZAAR) 50-12.5 MG tablet TAKE 1 TABLET BY MOUTH EVERY DAY  . metFORMIN (GLUCOPHAGE) 1000 MG tablet TAKE 1 TABLET BY MOUTH TWICE A DAY FOR DIABETES  . omeprazole (PRILOSEC) 20 MG capsule TAKE 1 CAPSULE BY MOUTH EVERY DAY  . simvastatin (ZOCOR) 40 MG tablet TAKE 1 TABLET BY MOUTH EVERYDAY AT BEDTIME   No facility-administered encounter medications on file as of 07/27/2020.     Objective:  Patient Care Plan: Diabetes Type 2 (Adult)    Problem Identified: Glycemic Management (Diabetes, Type 2)   Priority: High    Problem Identified: Disease Progression (Diabetes,  Type 2)     Long-Range Goal: Disease Progression Prevented or Minimized   Start Date: 06/03/2020  Expected End Date: 09/28/2020  Priority: High  Note:   Current Barriers:  . Chronic disease management and support related to Diabetes self-management   Case Manager Clinical Goal(s):  Over the next 120 days, patient will: [name] Demonstrate improved adherence to prescribed treatment plan for Diabetes self management as evidenced by daily monitoring and recording of CBG, adherence to ADA/ carb modified diet and adherence to prescribed medication regimen.  Interventions:  . Collaboration with Malva Limes, MD regarding development and update of comprehensive plan of care as evidenced by provider attestation and co-signature . Inter-disciplinary care team collaboration (see longitudinal plan of care) . Reviewed medications. Encouraged to take medications as prescribed and notify team with concerns regarding prescription cost. . Discussed blood glucose readings and compliance with diabetic diet. Reports reading have been within range. Feels that she is doing well with maintaining a heart healthy/diabetic diet. Encouraged to continue monitoring and recording readings. Declined need for additional diabetic resources. . Discussed importance of completing recommended DM preventive care. Pending updated labs.  Patient Goals/Self-Care Activities Over the next 120 days, patient will:  - Continue to self administer oral medications as prescribed - Attend all scheduled provider appointments - Monitor blood glucose levels consistently and utilize recommended interventions - Adhere to prescribed ADA/carb modified - Notify provider or care management team with health related questions and concern as needed  Follow Up Plan:  -Will follow up within the  next 60 days.              PLAN A member of the care management team will follow-up within the next 60 days.    [name]  Care Management [facility] [phone]

## 2020-08-11 ENCOUNTER — Other Ambulatory Visit: Payer: Self-pay | Admitting: Family Medicine

## 2020-08-11 ENCOUNTER — Ambulatory Visit: Payer: Self-pay | Admitting: *Deleted

## 2020-08-11 DIAGNOSIS — E119 Type 2 diabetes mellitus without complications: Secondary | ICD-10-CM

## 2020-08-11 NOTE — Telephone Encounter (Signed)
Patient is calling to report she is having numbness in her R leg at night- this has been going on for some time. Patient states mainly at night and prolonged sitting. 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 She is unable to come Friday- she will check her schedule to see if she can possible come next week. Note sent to office for review.  Reason for Disposition . [1] Numbness or tingling in one or both hands AND [2] is a chronic symptom (recurrent or ongoing AND present > 4 weeks)  Answer Assessment - Initial Assessment Questions 1. SYMPTOM: "What is the main symptom you are concerned about?" (e.g., weakness, numbness)     Numbness and tingling in R leg- since beginning of December 2. ONSET: "When did this start?" (minutes, hours, days; while sleeping)     1 1/2 months ago- mainly at night or prolonged sitting 3. LAST NORMAL: "When was the last time you were normal (no symptoms)?"     November 4. PATTERN "Does this come and go, or has it been constant since it started?"  "Is it present now?"     Comes and goes, yes- tingling now- she is sitting 5. CARDIAC SYMPTOMS: "Have you had any of the following symptoms: chest pain, difficulty breathing, palpitations?"     no 6. NEUROLOGIC SYMPTOMS: "Have you had any of the following symptoms: headache, dizziness, vision loss, double vision, changes in speech, unsteady on your feet?"     no 7. OTHER SYMPTOMS: "Do you have any other symptoms?"     No other symptoms 8. PREGNANCY: "Is there any chance you are pregnant?" "When was your last menstrual period?"     n/a  Protocols used: NEUROLOGIC DEFICIT-A-AH

## 2020-08-12 ENCOUNTER — Other Ambulatory Visit: Payer: Self-pay | Admitting: Internal Medicine

## 2020-08-12 ENCOUNTER — Other Ambulatory Visit: Payer: Self-pay | Admitting: Family Medicine

## 2020-08-12 DIAGNOSIS — E119 Type 2 diabetes mellitus without complications: Secondary | ICD-10-CM

## 2020-08-12 DIAGNOSIS — C50811 Malignant neoplasm of overlapping sites of right female breast: Secondary | ICD-10-CM

## 2020-08-12 DIAGNOSIS — C50911 Malignant neoplasm of unspecified site of right female breast: Secondary | ICD-10-CM

## 2020-08-12 DIAGNOSIS — Z79811 Long term (current) use of aromatase inhibitors: Secondary | ICD-10-CM

## 2020-08-23 ENCOUNTER — Ambulatory Visit: Payer: Self-pay | Admitting: Family Medicine

## 2020-08-24 ENCOUNTER — Ambulatory Visit (INDEPENDENT_AMBULATORY_CARE_PROVIDER_SITE_OTHER): Payer: Medicare HMO | Admitting: Family Medicine

## 2020-08-24 ENCOUNTER — Encounter: Payer: Self-pay | Admitting: Family Medicine

## 2020-08-24 ENCOUNTER — Other Ambulatory Visit: Payer: Self-pay

## 2020-08-24 VITALS — BP 125/69 | HR 80 | Temp 98.0°F | Resp 16 | Wt 143.0 lb

## 2020-08-24 DIAGNOSIS — E785 Hyperlipidemia, unspecified: Secondary | ICD-10-CM

## 2020-08-24 DIAGNOSIS — G629 Polyneuropathy, unspecified: Secondary | ICD-10-CM | POA: Diagnosis not present

## 2020-08-24 DIAGNOSIS — I10 Essential (primary) hypertension: Secondary | ICD-10-CM | POA: Diagnosis not present

## 2020-08-24 DIAGNOSIS — E119 Type 2 diabetes mellitus without complications: Secondary | ICD-10-CM | POA: Diagnosis not present

## 2020-08-24 LAB — POCT GLYCOSYLATED HEMOGLOBIN (HGB A1C)
Est. average glucose Bld gHb Est-mCnc: 163
Hemoglobin A1C: 7.3 % — AB (ref 4.0–5.6)

## 2020-08-24 NOTE — Patient Instructions (Addendum)
.   You can take OTC B12 1,000 mg every day  . If not much better within 6 weeks, then add alpha-lipoic acid 600mg  twice a day

## 2020-08-24 NOTE — Progress Notes (Signed)
Established patient visit   Patient: Hannah [name]   DOB: Jan 15, 1951   70 y.o. Female  MRN: [id number] Visit Date: 08/24/2020  Today's healthcare provider: Lelon Huh, MD   Chief Complaint  Patient presents with  . Diabetes   Subjective    HPI  Diabetes Mellitus Type II, Follow-up  Lab Results  Component Value Date   HGBA1C 7.3 (A) 08/24/2020   HGBA1C 7.9 (H) 05/17/2020   HGBA1C 7.8 (A) 01/13/2020   Wt Readings from Last 3 Encounters:  08/24/20 143 lb (64.9 kg)  05/17/20 147 lb 9.6 oz (67 kg)  03/17/20 146 lb 12.8 oz (66.6 kg)   Last seen for diabetes 3 months ago.  Management since then includes increasing Glipizide from 2.5mg  to 5mg  daily. Dosage was later decreased back down to 2.5mg  due to side effects and patient intolerance. Patient was advised to work on improving her diet and trying to get more exercise. She reports good compliance with treatment. She is having side effects. She reports that she is having numbness and tingling in her right foot. She reports that it mainly happens at night.  Symptoms: No fatigue No foot ulcerations  No appetite changes No nausea  Yes paresthesia of the feet  No polydipsia  No polyuria No visual disturbances   No vomiting     Home blood sugar records: not being checked  Episodes of hypoglycemia? No    Current insulin regiment: none Most Recent Eye Exam: 08/04/2019 Current exercise: no regular exercise Current diet habits: well balanced  Pertinent Labs: Lab Results  Component Value Date   CHOL 162 01/14/2020   HDL 76 01/14/2020   LDLCALC 68 01/14/2020   LDLDIRECT 74.0 02/07/2016   TRIG 99 01/14/2020   CHOLHDL 2.1 01/14/2020   Lab Results  Component Value Date   NA 136 03/17/2020   K 4.5 03/17/2020   CREATININE 0.90 03/17/2020   GFRNONAA >60 03/17/2020   GFRAA >60 03/17/2020   GLUCOSE 199 (H) 03/17/2020      Hypertension, follow-up  BP Readings from Last 3 Encounters:  08/24/20 125/69  05/17/20  122/69  03/17/20 133/86   Wt Readings from Last 3 Encounters:  08/24/20 143 lb (64.9 kg)  05/17/20 147 lb 9.6 oz (67 kg)  03/17/20 146 lb 12.8 oz (66.6 kg)     She was last seen for hypertension 3 months ago.  BP at that visit was 122/69. Management since that visit includes continue same medications.  She reports good compliance with treatment. She is not having side effects.  She is following a Regular diet. She is not exercising. She does not smoke.  Use of agents associated with hypertension: none.   Outside blood pressures are not being checked. Symptoms: No chest pain No chest pressure  No palpitations No syncope  No dyspnea No orthopnea  No paroxysmal nocturnal dyspnea No lower extremity edema   Pertinent labs: Lab Results  Component Value Date   CHOL 162 01/14/2020   HDL 76 01/14/2020   LDLCALC 68 01/14/2020   LDLDIRECT 74.0 02/07/2016   TRIG 99 01/14/2020   CHOLHDL 2.1 01/14/2020   Lab Results  Component Value Date   NA 136 03/17/2020   K 4.5 03/17/2020   CREATININE 0.90 03/17/2020   GFRNONAA >60 03/17/2020   GFRAA >60 03/17/2020   GLUCOSE 199 (H) 03/17/2020     The 10-year ASCVD risk score Mikey Bussing DC Jr., et al., 2013) is: 17.4%     Medications: Outpatient  Medications Prior to Visit  Medication Sig  . anastrozole (ARIMIDEX) 1 MG tablet TAKE 1 TABLET BY MOUTH EVERY DAY  . carvedilol (COREG) 12.5 MG tablet TAKE 1 TABLET BY MOUTH TWICE A DAY  . Cholecalciferol (VITAMIN D3) 1000 UNITS CAPS Take 1 capsule by mouth daily.   [name] FLUoxetine (PROZAC) 40 MG capsule TAKE 1 CAPSULE BY MOUTH EVERY DAY  . fluticasone (FLONASE) 50 MCG/ACT nasal spray SPRAY 2 SPRAYS INTO EACH NOSTRIL EVERY DAY (Patient taking differently: As needed)  . glipiZIDE (GLUCOTROL XL) 5 MG 24 hr tablet TAKE 1 TABLET BY MOUTH EVERY DAY WITH BREAKFAST  . losartan-hydrochlorothiazide (HYZAAR) 50-12.5 MG tablet TAKE 1 TABLET BY MOUTH EVERY DAY  . metFORMIN (GLUCOPHAGE) 1000 MG tablet TAKE 1 TABLET  BY MOUTH TWICE A DAY FOR DIABETES  . omeprazole (PRILOSEC) 20 MG capsule TAKE 1 CAPSULE BY MOUTH EVERY DAY  . simvastatin (ZOCOR) 40 MG tablet TAKE 1 TABLET BY MOUTH EVERYDAY AT BEDTIME   No facility-administered medications prior to visit.      Objective    BP 125/69   Pulse 80   Temp 98 F (36.7 C)   Resp 16   Wt 143 lb (64.9 kg)   LMP 08/30/2003   BMI 27.93 kg/m     Physical Exam   General: Appearance:     Overweight female in no acute distress  Eyes:    PERRL, conjunctiva/corneas clear, EOM's intact       Lungs:     Clear to auscultation bilaterally, respirations unlabored  Heart:    Normal heart rate. Normal rhythm. No murmurs, rubs, or gallops. +1 pedal pulses on right, +2 on left.    MS:   All extremities are intact.   Neurologic:   Awake, alert, oriented x 3. No apparent focal neurological           defect.        Results for orders placed or performed in visit on 08/24/20  POCT HgB A1C  Result Value Ref Range   Hemoglobin A1C 7.3 (A) 4.0 - 5.6 %   Est. average glucose Bld gHb Est-mCnc 163     Assessment & Plan     1. Type 2 diabetes mellitus without complication, without long-term current use of insulin (HCC) Improving. Continue current medications.  Encourage healthy diabetic diet. Recheck a1c 4-5 months.   2. Neuropathy right LE Multifactorial. Slightly diminished pedal pulses on affected LE. May be exacerbated by affects of metformin on b12 absorption. Recommend she try OTC b12 supplements and add alpha-lipoic acid if b12 supplements not effective.   3. Hyperlipidemia, unspecified hyperlipidemia type She is tolerating simvastatin well with no adverse effects.    4. Primary hypertension Well controlled.  Continue current medications.        The entirety of the information documented in the History of Present Illness, Review of Systems and Physical Exam were personally obtained by me. Portions of this information were initially documented by the CMA and  reviewed by me for thoroughness and accuracy.      Lelon Huh, MD  [facility] [phone] (phone) [phone] (fax)  [place]

## 2020-09-17 ENCOUNTER — Inpatient Hospital Stay (HOSPITAL_BASED_OUTPATIENT_CLINIC_OR_DEPARTMENT_OTHER): Payer: Medicare HMO | Admitting: Internal Medicine

## 2020-09-17 ENCOUNTER — Inpatient Hospital Stay: Payer: Medicare HMO | Attending: Internal Medicine

## 2020-09-17 ENCOUNTER — Encounter: Payer: Self-pay | Admitting: Internal Medicine

## 2020-09-17 DIAGNOSIS — Z170 Estrogen receptor positive status [ER+]: Secondary | ICD-10-CM | POA: Diagnosis not present

## 2020-09-17 DIAGNOSIS — D649 Anemia, unspecified: Secondary | ICD-10-CM

## 2020-09-17 DIAGNOSIS — C50811 Malignant neoplasm of overlapping sites of right female breast: Secondary | ICD-10-CM | POA: Diagnosis not present

## 2020-09-17 DIAGNOSIS — R202 Paresthesia of skin: Secondary | ICD-10-CM | POA: Insufficient documentation

## 2020-09-17 DIAGNOSIS — Z79811 Long term (current) use of aromatase inhibitors: Secondary | ICD-10-CM | POA: Diagnosis not present

## 2020-09-17 DIAGNOSIS — R200 Anesthesia of skin: Secondary | ICD-10-CM | POA: Diagnosis not present

## 2020-09-17 LAB — COMPREHENSIVE METABOLIC PANEL
ALT: 19 U/L (ref 0–44)
AST: 21 U/L (ref 15–41)
Albumin: 3.9 g/dL (ref 3.5–5.0)
Alkaline Phosphatase: 47 U/L (ref 38–126)
Anion gap: 11 (ref 5–15)
BUN: 16 mg/dL (ref 8–23)
CO2: 25 mmol/L (ref 22–32)
Calcium: 9.2 mg/dL (ref 8.9–10.3)
Chloride: 97 mmol/L — ABNORMAL LOW (ref 98–111)
Creatinine, Ser: 0.81 mg/dL (ref 0.44–1.00)
GFR, Estimated: >60 mL/min (ref >60)
Glucose, Bld: 173 mg/dL — ABNORMAL HIGH (ref 70–99)
Potassium: 4.3 mmol/L (ref 3.5–5.1)
Sodium: 133 mmol/L — ABNORMAL LOW (ref 135–145)
Total Bilirubin: 0.9 mg/dL (ref 0.3–1.2)
Total Protein: 7 g/dL (ref 6.5–8.1)

## 2020-09-17 LAB — CBC WITH DIFFERENTIAL/PLATELET
Abs Immature Granulocytes: 0.02 10*3/uL (ref 0.00–0.07)
Basophils Absolute: 0.1 10*3/uL (ref 0.0–0.1)
Basophils Relative: 1 %
Eosinophils Absolute: 0.2 10*3/uL (ref 0.0–0.5)
Eosinophils Relative: 3 %
HCT: 35.8 % — ABNORMAL LOW (ref 36.0–46.0)
Hemoglobin: 11.5 g/dL — ABNORMAL LOW (ref 12.0–15.0)
Immature Granulocytes: 0 %
Lymphocytes Relative: 29 %
Lymphs Abs: 1.8 10*3/uL (ref 0.7–4.0)
MCH: 28.6 pg (ref 26.0–34.0)
MCHC: 32.1 g/dL (ref 30.0–36.0)
MCV: 89.1 fL (ref 80.0–100.0)
Monocytes Absolute: 0.5 10*3/uL (ref 0.1–1.0)
Monocytes Relative: 8 %
Neutro Abs: 3.8 10*3/uL (ref 1.7–7.7)
Neutrophils Relative %: 59 %
Platelets: 455 10*3/uL — ABNORMAL HIGH (ref 150–400)
RBC: 4.02 MIL/uL (ref 3.87–5.11)
RDW: 12.7 % (ref 11.5–15.5)
WBC: 6.3 10*3/uL (ref 4.0–10.5)
nRBC: 0 % (ref 0.0–0.2)

## 2020-09-17 LAB — IRON AND TIBC
Iron: 58 ug/dL (ref 28–170)
Saturation Ratios: 11 % (ref 10.4–31.8)
TIBC: 552 ug/dL — ABNORMAL HIGH (ref 250–450)
UIBC: 494 ug/dL

## 2020-09-17 LAB — FERRITIN: Ferritin: 7 ng/mL — ABNORMAL LOW (ref 11–307)

## 2020-09-17 NOTE — Progress Notes (Signed)
[facility] OFFICE PROGRESS NOTE  Patient Care Team: Birdie Sons, MD as PCP - General (Family Medicine) Cammie Sickle, MD as Consulting Physician (Internal Medicine) [facility], [facility]) Bary Castilla, Forest Gleason, MD as Consulting Physician (General Surgery) Neldon Labella, RN as Case Manager  Cancer Staging No matching staging information was found for the patient.   Oncology History Overview Note  # AUG 2011- T1b N0 M0 (clinical, stage I) infiltrating ductal carcinoma of the right breast status post wide local excision and sentinel node study March 14, 2010. Tumor size 9 mm, grade 2, margins negative.  2 sentinel lymph nodes negative.  ER positive (90%), PR borderline (5%) HER-2/neu 2+ on IHC, negative on FISH. Her2/CEP ratio is 1.45. April 11, 2010 - Oncotype DX score is 35 (high risk group) with average rate of distant recurrence of 24%. Got mammosite.   Patient completed adjuvant chemotherapy with Taxotere/Cytoxan, then radiation, then started aromatase inhibitor in February 2012.x 10 years- STOPPED FEB 2022.  ---------------------------------------------------------   DIAGNOSIS: RIGHT BREAST CA  STAGE:  I       ;GOALS: CURE  CURRENT/MOST RECENT THERAPY : Arimidex    Malignant neoplasm of overlapping sites of right breast ([place])      INTERVAL HISTORY:  Hannah [name] 70 y.o.  female pleasant patient above history of breast cancer ER PR positive HER-2 negative currently on Arimidex is here for follow-up.  In the interim patient's mother passed away.  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  Patient notes to have tingling and numbness of the right lower extremity starting the hip radiating down the leg.  Denies any pain or swelling.  Did start approximately 3 months ago.  No weakness.  Improved with movement.  Otherwise denies any falls.  No worsening joint pains or bone pain.  No new shortness of breath or cough.  Review of  Systems  Constitutional: Negative for chills, diaphoresis, fever, malaise/fatigue and weight loss.  HENT: Negative for nosebleeds and sore throat.   Eyes: Negative for double vision.  Respiratory: Negative for cough, hemoptysis, sputum production, shortness of breath and wheezing.   Cardiovascular: Negative for chest pain, palpitations, orthopnea and leg swelling.  Gastrointestinal: Negative for abdominal pain, blood in stool, constipation, diarrhea, heartburn, melena, nausea and vomiting.  Genitourinary: Negative for dysuria, frequency and urgency.  Musculoskeletal: Positive for back pain and joint pain.  Skin: Negative.  Negative for itching and rash.  Neurological: Positive for tingling. Negative for dizziness, focal weakness, weakness and headaches.  Endo/Heme/Allergies: Does not bruise/bleed easily.  Psychiatric/Behavioral: Negative for depression. The patient is not nervous/anxious and does not have insomnia.       PAST MEDICAL HISTORY :  Past Medical History:  Diagnosis Date  . Allergy   . Arthritis   . Cancer ([place]) 2011-Right    DCIS. 9 mm histologic grade 2 invasive mammary carcinoma, ER 90%, PR 5%, HER-2/neu not over expressed. Sentinel nodes negative.  . Chicken pox   . Diabetes mellitus without complication ([place])   . Fractured elbow   . History of lumpectomy 2011   Right breast  . Hyperlipidemia   . Hypertension 2001  . Personal history of chemotherapy 2011   Right Breast  . Personal history of radiation therapy 2011   Right breast  . Radial head fracture, closed 12/19/2016    PAST SURGICAL HISTORY :   Past Surgical History:  Procedure Laterality Date  . BREAST BIOPSY Right 2011   Invasive Ductal Carcinoma  .  BREAST LUMPECTOMY Right 2011  . BREAST SURGERY Right 2011    right breast wide excision, partial breast radiation.  . COLONOSCOPY  Declines   Reports she has annual stool fecal occult blood testing.  [name] DILATION AND CURETTAGE OF UTERUS  1990  .  PORT-A-CATH REMOVAL  2012  . right breast wide excision  2011  . WRIST SURGERY  1998   pins placed    FAMILY HISTORY :   Family History  Problem Relation Age of Onset  . Arthritis Mother   . Alzheimer's disease Mother   . Arthritis Father   . Heart disease Father   . Arthritis Maternal Grandmother   . Breast cancer Maternal Grandmother   . Alzheimer's disease Maternal Grandmother   . Stroke Maternal Grandfather     SOCIAL HISTORY:   Social History   Tobacco Use  . Smoking status: Never Smoker  . Smokeless tobacco: Never Used  Vaping Use  . Vaping Use: Never used  Substance Use Topics  . Alcohol use: No  . Drug use: No    ALLERGIES:  is allergic to azithromycin, codeine, and penicillins.  MEDICATIONS:  Current Outpatient Medications  Medication Sig Dispense Refill  . anastrozole (ARIMIDEX) 1 MG tablet TAKE 1 TABLET BY MOUTH EVERY DAY 60 tablet 0  . carvedilol (COREG) 12.5 MG tablet TAKE 1 TABLET BY MOUTH TWICE A DAY 180 tablet 0  . Cholecalciferol (VITAMIN D3) 1000 UNITS CAPS Take 1 capsule by mouth daily.     . cyanocobalamin 1000 MCG tablet Take 1,000 mcg by mouth daily.    [name] FLUoxetine (PROZAC) 40 MG capsule TAKE 1 CAPSULE BY MOUTH EVERY DAY 90 capsule 3  . fluticasone (FLONASE) 50 MCG/ACT nasal spray SPRAY 2 SPRAYS INTO EACH NOSTRIL EVERY DAY (Patient taking differently: As needed) 16 mL 1  . glipiZIDE (GLUCOTROL XL) 5 MG 24 hr tablet TAKE 1 TABLET BY MOUTH EVERY DAY WITH BREAKFAST 90 tablet 1  . losartan-hydrochlorothiazide (HYZAAR) 50-12.5 MG tablet TAKE 1 TABLET BY MOUTH EVERY DAY 90 tablet 4  . metFORMIN (GLUCOPHAGE) 1000 MG tablet TAKE 1 TABLET BY MOUTH TWICE A DAY FOR DIABETES 180 tablet 1  . omeprazole (PRILOSEC) 20 MG capsule TAKE 1 CAPSULE BY MOUTH EVERY DAY 90 capsule 1  . simvastatin (ZOCOR) 40 MG tablet TAKE 1 TABLET BY MOUTH EVERYDAY AT BEDTIME 90 tablet 2   No current facility-administered medications for this visit.    PHYSICAL EXAMINATION: ECOG  PERFORMANCE STATUS: 0 - Asymptomatic  BP 127/87 (BP Location: Left Arm, Patient Position: Sitting, Cuff Size: Normal)   Pulse 87   Temp 99.5 F (37.5 C) (Tympanic)   Resp 16   Ht 5' (1.524 m)   Wt 144 lb 9.6 oz (65.6 kg)   LMP 08/30/2003   SpO2 96%   BMI 28.24 kg/m   Filed Weights   09/17/20 1030  Weight: 144 lb 9.6 oz (65.6 kg)    Physical Exam HENT:     Head: Normocephalic and atraumatic.     Mouth/Throat:     Pharynx: No oropharyngeal exudate.  Eyes:     Pupils: Pupils are equal, round, and reactive to light.  Cardiovascular:     Rate and Rhythm: Normal rate and regular rhythm.  Pulmonary:     Effort: No respiratory distress.     Breath sounds: No wheezing.  Abdominal:     General: Bowel sounds are normal. There is no distension.     Palpations: Abdomen is soft. There is no  mass.     Tenderness: There is no abdominal tenderness. There is no guarding or rebound.  Musculoskeletal:        General: No tenderness. Normal range of motion.     Cervical back: Normal range of motion and neck supple.  Skin:    General: Skin is warm.  Neurological:     Mental Status: She is alert and oriented to person, place, and time.  Psychiatric:        Mood and Affect: Affect normal.        LABORATORY DATA:  I have reviewed the data as listed    Component Value Date/Time   NA 133 (L) 09/17/2020 0957   NA 136 01/14/2020 1025   K 4.3 09/17/2020 0957   CL 97 (L) 09/17/2020 0957   CO2 25 09/17/2020 0957   GLUCOSE 173 (H) 09/17/2020 0957   BUN 16 09/17/2020 0957   BUN 15 01/14/2020 1025   CREATININE 0.81 09/17/2020 0957   CREATININE 0.69 06/13/2017 0844   CALCIUM 9.2 09/17/2020 0957   CALCIUM 9.9 02/10/2014 1458   PROT 7.0 09/17/2020 0957   PROT 7.0 01/14/2020 1025   PROT 7.4 02/10/2014 1458   ALBUMIN 3.9 09/17/2020 0957   ALBUMIN 4.5 01/14/2020 1025   ALBUMIN 3.8 02/10/2014 1458   AST 21 09/17/2020 0957   AST 17 02/10/2014 1458   ALT 19 09/17/2020 0957   ALT 34  02/10/2014 1458   ALKPHOS 47 09/17/2020 0957   ALKPHOS 69 02/10/2014 1458   BILITOT 0.9 09/17/2020 0957   BILITOT 0.5 01/14/2020 1025   BILITOT 0.5 02/10/2014 1458   GFRNONAA >60 09/17/2020 0957   GFRNONAA 91 06/13/2017 0844   GFRAA >60 03/17/2020 1022   GFRAA 105 06/13/2017 0844    No results found for: SPEP, UPEP  Lab Results  Component Value Date   WBC 6.3 09/17/2020   NEUTROABS 3.8 09/17/2020   HGB 11.5 (L) 09/17/2020   HCT 35.8 (L) 09/17/2020   MCV 89.1 09/17/2020   PLT 455 (H) 09/17/2020      Chemistry      Component Value Date/Time   NA 133 (L) 09/17/2020 0957   NA 136 01/14/2020 1025   K 4.3 09/17/2020 0957   CL 97 (L) 09/17/2020 0957   CO2 25 09/17/2020 0957   BUN 16 09/17/2020 0957   BUN 15 01/14/2020 1025   CREATININE 0.81 09/17/2020 0957   CREATININE 0.69 06/13/2017 0844      Component Value Date/Time   CALCIUM 9.2 09/17/2020 0957   CALCIUM 9.9 02/10/2014 1458   ALKPHOS 47 09/17/2020 0957   ALKPHOS 69 02/10/2014 1458   AST 21 09/17/2020 0957   AST 17 02/10/2014 1458   ALT 19 09/17/2020 0957   ALT 34 02/10/2014 1458   BILITOT 0.9 09/17/2020 0957   BILITOT 0.5 01/14/2020 1025   BILITOT 0.5 02/10/2014 1458       RADIOGRAPHIC STUDIES: I have personally reviewed the radiological images as listed and agreed with the findings in the report. No results found.   ASSESSMENT & PLAN:  Malignant neoplasm of overlapping sites of right breast ([place]) # STAGE I Breast ca ER/PR-Pos; her 2 Neu NEG [high risk oncotype]. Patient Arimidex/extended duration x10 years.  I think is reasonable to stop anastrozole at this time.  Clinically noticed recurrence.  Stable.  #Tingling and numbness involving right lower extremity/radiating down her leg-no pain question sciatica.  If not improved recommend follow-up with PCP/imaging.  # Mild anemia- Hb 11.5 [  Dec 2021 cologard-NEG;  February 2022-ferritin 9 saturation 11%-concerning for iron deficiency.  Will reach out to Dr.  Bary Castilla regarding colonoscopy/EGD.  # BMD 2021- T score-: -0.9; on ca=vit D- STABLE.   # DISPOSITION:  # follow up as needed- Dr.B.    No orders of the defined types were placed in this encounter.  All questions were answered. The patient knows to call the clinic with any problems, questions or concerns.      Cammie Sickle, MD 09/17/2020 12:40 PM

## 2020-09-17 NOTE — Assessment & Plan Note (Addendum)
#   STAGE I Breast ca ER/PR-Pos; her 2 Neu NEG [high risk oncotype]. Patient Arimidex/extended duration x10 years.  I think is reasonable to stop anastrozole at this time.  Clinically noticed recurrence.  Stable.  #Tingling and numbness involving right lower extremity/radiating down her leg-no pain question sciatica.  If not improved recommend follow-up with PCP/imaging.  # Mild anemia- Hb 11.5 [ Dec 2021 cologard-NEG;  February 2022-ferritin 9 saturation 11%-concerning for iron deficiency.  Will reach out to Dr. Bary Castilla regarding colonoscopy/EGD.  # BMD 2021- T score-: -0.9; on ca=vit D- STABLE.   # DISPOSITION:  # follow up as needed- Dr.B.

## 2020-09-17 NOTE — Progress Notes (Signed)
R leg has been feeling numb and tingly since 2023/08/10. Mother recently passed away last week

## 2020-09-20 ENCOUNTER — Telehealth: Payer: Self-pay | Admitting: Internal Medicine

## 2020-09-20 NOTE — Patient Instructions (Signed)
Thank you for allowing the Chronic Care Management team to participate in your care.  Patient Care Plan: Diabetes Type 2 (Adult)    Problem Identified: Glycemic Management (Diabetes, Type 2)   Priority: High    Problem Identified: Disease Progression (Diabetes, Type 2)     Long-Range Goal: Disease Progression Prevented or Minimized   Start Date: 06/03/2020  Expected End Date: 09/28/2020  Priority: High  Note:   Current Barriers:  . Chronic disease management and support related to Diabetes self-management   Case Manager Clinical Goal(s):  Over the next 120 days, patient will: [name] Demonstrate improved adherence to prescribed treatment plan for Diabetes self management as evidenced by daily monitoring and recording of CBG, adherence to ADA/ carb modified diet and adherence to prescribed medication regimen.  Interventions:  . Collaboration with Birdie Sons, MD regarding development and update of comprehensive plan of care as evidenced by provider attestation and co-signature . Inter-disciplinary care team collaboration (see longitudinal plan of care) . Reviewed medications. Encouraged to take medications as prescribed and notify team with concerns regarding prescription cost. . Discussed blood glucose readings and compliance with diabetic diet. Reports reading have been within range. Feels that she is doing well with maintaining a heart healthy/diabetic diet. Encouraged to continue monitoring and recording readings. Declined need for additional diabetic resources. . Discussed importance of completing recommended DM preventive care. Pending updated labs.  Patient Goals/Self-Care Activities Over the next 120 days, patient will:  - Continue to self administer oral medications as prescribed - Attend all scheduled provider appointments - Monitor blood glucose levels consistently and utilize recommended interventions - Adhere to prescribed ADA/carb modified - Notify provider or care  management team with health related questions and concern as needed  Follow Up Plan:  -Will follow up within the next 60 days.            Hannah [name] verbalized understanding of the information discussed during the telephonic outreach today. Declined need for mailed/printed resources. A member of the care management team will follow up within the next 60 days.   Hannah [name] [phone]

## 2020-09-20 NOTE — Telephone Encounter (Signed)
On 1/18-I called patient spoke to her regarding the results of the iron studies suggestive of iron deficiency.  Recommend p.o. iron.  Recommend follow-up with Dr. Dwyane Luo office regarding GI work up- EGD/colonoscopy.  Discussed with Dr. Bary Castilla.   GB

## 2020-09-21 ENCOUNTER — Ambulatory Visit (INDEPENDENT_AMBULATORY_CARE_PROVIDER_SITE_OTHER): Payer: Medicare HMO

## 2020-09-21 ENCOUNTER — Other Ambulatory Visit: Payer: Self-pay | Admitting: General Surgery

## 2020-09-21 DIAGNOSIS — E611 Iron deficiency: Secondary | ICD-10-CM | POA: Diagnosis not present

## 2020-09-21 DIAGNOSIS — E119 Type 2 diabetes mellitus without complications: Secondary | ICD-10-CM | POA: Diagnosis not present

## 2020-09-21 DIAGNOSIS — R790 Abnormal level of blood mineral: Secondary | ICD-10-CM | POA: Diagnosis not present

## 2020-09-21 DIAGNOSIS — D509 Iron deficiency anemia, unspecified: Secondary | ICD-10-CM | POA: Diagnosis not present

## 2020-09-21 NOTE — Patient Instructions (Addendum)
Thank you for allowing the Chronic Care Management team to participate in your care.    Patient Care Plan: Diabetes Type 2 (Adult)    Problem Identified: Glycemic Management (Diabetes, Type 2)   Priority: High    Problem Identified: Disease Progression (Diabetes, Type 2)     Long-Range Goal: Disease Progression Prevented or Minimized   Start Date: 06/03/2020  Expected End Date: 09/28/2020  Priority: High  Note:   Current Barriers:  . Chronic disease management and support related to Diabetes self-management   Case Manager Clinical Goal(s):  Over the next 120 days, patient will: [name] Demonstrate improved adherence to prescribed treatment plan for Diabetes self management as evidenced by daily monitoring and recording of CBG, adherence to ADA/ carb modified diet and adherence to prescribed medication regimen.  Interventions:  . Collaboration with Birdie Sons, MD regarding development and update of comprehensive plan of care as evidenced by provider attestation and co-signature . Inter-disciplinary care team collaboration (see longitudinal plan of care) . Reviewed medications. Encouraged to take medications as prescribed and notify team with concerns regarding prescription cost. . Discussed blood glucose readings and compliance with diabetic diet. Reports not monitoring her blood glucose levels consistently due to planning and funeral preparations following her mother's recent death. Reports decreased intake due to being busy but still feels that she is doing well with maintaining the recommended diet. She is aware of s/sx of hypoglycemia and hyperglycemia and appropriate interventions.    Patient Goals/Self-Care Activities Over the next 120 days, patient will:  - Continue to self administer oral medications as prescribed - Continue to attend scheduled provider appointments - Monitor blood glucose levels consistently and utilize recommended interventions - Continue adherence to  prescribed ADA/carb modified - Notify provider or care management team with health related questions and concern as needed  Follow Up Plan:  -Will follow up in April.     Patient Care Plan: Iron Deficiency (Adult)    Problem Identified: Iron Deficiency     Long-Range Goal: Anemia Prevented or Managed   Start Date: 09/21/2020  Expected End Date: 12/20/2020  Priority: High  Note:   Lab Results  Component Value Date   IRON 58 09/17/2020   TIBC 552 (H) 09/17/2020   FERRITIN 7 (L) 09/17/2020     Current Barriers:  . Chronic Disease Management support and education needs related to Iron Deficiency  Nurse Case Manager Clinical Goal(s):   Over the next 90 days, patient will not require hospitalization or experience complications d/t iron deficiency.  Interventions:  . Collaboration with Birdie Sons, MD regarding development and update of comprehensive plan of care as evidenced by provider attestation and co-signature . Inter-disciplinary care team collaboration (see longitudinal plan of care) . Reviewed medications. Reports including iron supplement and Vitamin B12 to her regimen as recommended by Dr. Rogue Bussing. Reports compliance with recommended diet. . Reviewed s/sx associated with iron deficiency that may cause imbalance/falls. Discussed symptoms that require immediate medical attention. Denies experiencing dizziness, lightheadedness or weakness. Denies chest pain or palpitations. . Discussed recommendation for treatment and follow up. Currently following plan for oral iron and B12. She will follow up with Dr. Bary Castilla later today to discuss work-up for  EGD/colonoscopy.  Patient Goals/Self-Care Activities: Over the next 90 days, patient will: - Continue taking medications and oral supplements as recommended. - Maintain adequate hydration and adhere to recommended diet. - Complete required labs and attend scheduled provider follow-up appointments - Contact provider or care  management team with  questions or new concerns  Follow Up Plan:  Will follow up within the next 6-8 weeks.      Ms. Symonds verbalized understanding of the information discussed during the telephonic outreach today. Declined need for mailed/printed resources. A member of the care management team will follow-up with Ms. Santoyo within the next 6-8 weeks.   [name] [facility] [phone]

## 2020-09-21 NOTE — Chronic Care Management (AMB) (Signed)
Chronic Care Management   Follow Up Note   09/21/2020 Name: Hannah [name] MRN: [id number] DOB: 1950-12-20  Primary Care Provider: Birdie Sons, MD Reason for referral : Chronic Care Management  Hannah [name] is a 70 y.o. year old female who is a primary care patient of Hannah [name], Hannah Peri, MD. She is currently engaged with the chronic care management team. A routine telephonic outreach was conducted today.  Review of Hannah [name]'s status, including review of consultants reports, relevant labs and test results was conducted today. Collaboration with appropriate care team members was performed as part of the comprehensive evaluation and provision of chronic care management services.    SDOH (Social Determinants of Health) assessments performed: No     Outpatient Encounter Medications as of 09/21/2020  Medication Sig  . anastrozole (ARIMIDEX) 1 MG tablet TAKE 1 TABLET BY MOUTH EVERY DAY  . carvedilol (COREG) 12.5 MG tablet TAKE 1 TABLET BY MOUTH TWICE A DAY  . Cholecalciferol (VITAMIN D3) 1000 UNITS CAPS Take 1 capsule by mouth daily.   . cyanocobalamin 1000 MCG tablet Take 1,000 mcg by mouth daily.  [name] FLUoxetine (PROZAC) 40 MG capsule TAKE 1 CAPSULE BY MOUTH EVERY DAY  . fluticasone (FLONASE) 50 MCG/ACT nasal spray SPRAY 2 SPRAYS INTO EACH NOSTRIL EVERY DAY (Patient taking differently: As needed)  . glipiZIDE (GLUCOTROL XL) 5 MG 24 hr tablet TAKE 1 TABLET BY MOUTH EVERY DAY WITH BREAKFAST  . losartan-hydrochlorothiazide (HYZAAR) 50-12.5 MG tablet TAKE 1 TABLET BY MOUTH EVERY DAY  . metFORMIN (GLUCOPHAGE) 1000 MG tablet TAKE 1 TABLET BY MOUTH TWICE A DAY FOR DIABETES  . omeprazole (PRILOSEC) 20 MG capsule TAKE 1 CAPSULE BY MOUTH EVERY DAY  . simvastatin (ZOCOR) 40 MG tablet TAKE 1 TABLET BY MOUTH EVERYDAY AT BEDTIME   No facility-administered encounter medications on file as of 09/21/2020.     Objective:  Lab Results  Component Value Date   HGBA1C 7.3 (A) 08/24/2020   Patient  Care Plan: Diabetes Type 2 (Adult)    Problem Identified: Glycemic Management (Diabetes, Type 2)   Priority: High    Problem Identified: Disease Progression (Diabetes, Type 2)     Long-Range Goal: Disease Progression Prevented or Minimized   Start Date: 06/03/2020  Expected End Date: 09/28/2020  Priority: High  Note:   Current Barriers:  . Chronic disease management and support related to Diabetes self-management   Case Manager Clinical Goal(s):  Over the next 120 days, patient will: [name] Demonstrate improved adherence to prescribed treatment plan for Diabetes self management as evidenced by daily monitoring and recording of CBG, adherence to ADA/ carb modified diet and adherence to prescribed medication regimen.  Interventions:  . Collaboration with Hannah Sons, MD regarding development and update of comprehensive plan of care as evidenced by provider attestation and co-signature . Inter-disciplinary care team collaboration (see longitudinal plan of care) . Reviewed medications. Encouraged to take medications as prescribed and notify team with concerns regarding prescription cost. . Discussed blood glucose readings and compliance with diabetic diet. Reports not monitoring her blood glucose levels consistently due to planning and funeral preparations following her mother's recent death. Reports decreased intake due to being busy but still feels that she is doing well with maintaining the recommended diet. She is aware of s/sx of hypoglycemia and hyperglycemia and appropriate interventions.    Patient Goals/Self-Care Activities Over the next 120 days, patient will:  - Continue to self administer oral medications as prescribed - Continue to attend scheduled  provider appointments - Monitor blood glucose levels consistently and utilize recommended interventions - Continue adherence to prescribed ADA/carb modified - Notify provider or care management team with health related questions and  concern as needed  Follow Up Plan:  -Will follow up in April.     Patient Care Plan: Iron Deficiency (Adult)    Problem Identified: Iron Deficiency     Long-Range Goal: Anemia Prevented or Managed   Start Date: 09/21/2020  Expected End Date: 12/20/2020  Priority: High  Note:   Lab Results  Component Value Date   IRON 58 09/17/2020   TIBC 552 (H) 09/17/2020   FERRITIN 7 (L) 09/17/2020     Current Barriers:  . Chronic Disease Management support and education needs related to Iron Deficiency  Nurse Case Manager Clinical Goal(s):   Over the next 90 days, patient will not require hospitalization or experience complications d/t iron deficiency.  Interventions:  . Collaboration with Hannah Sons, MD regarding development and update of comprehensive plan of care as evidenced by provider attestation and co-signature . Inter-disciplinary care team collaboration (see longitudinal plan of care) . Reviewed medications. Reports including iron supplement and Vitamin B12 to her regimen as recommended by Hannah [name]. Reports compliance with recommended diet. . Reviewed s/sx associated with iron deficiency that may cause imbalance/falls. Discussed symptoms that require immediate medical attention. Denies experiencing dizziness, lightheadedness or weakness. Denies chest pain or palpitations. . Discussed recommendation for treatment and follow up. Currently following plan for oral iron and B12. She will follow up with Dr. Bary [name] later today to discuss work-up for  EGD/colonoscopy.  Patient Goals/Self-Care Activities: Over the next 90 days, patient will: - Continue taking medications and oral supplements as recommended. - Maintain adequate hydration and adhere to recommended diet. - Complete required labs and attend scheduled provider follow-up appointments - Contact provider or care management team with questions or new concerns  Follow Up Plan:  Will follow up within the next 6-8  weeks.     PLAN A member of the care management team will follow-up with Hannah [name] within the next 6-8 weeks.   Hannah [name] [facility] [phone]

## 2020-09-22 ENCOUNTER — Other Ambulatory Visit: Payer: Self-pay | Admitting: General Surgery

## 2020-09-22 NOTE — Progress Notes (Signed)
Subjective:     Patient ID: Hannah [name] is a 70 y.o. female.  HPI  The following portions of the patient's history were reviewed and updated as appropriate.  This an established patient is here today for: office visit. The patient has been referred by Dr. Rogue Bussing for evaluation of an upper and lower endoscopy due to iron deficiency anemia. The patient states she does not want to have a colonoscopy since she recently had a cologuard done in December and reports it was negative.   The patient reports her mother passed away in 10/04/22 of whom she was the primary caregiver for 8+ years.      Chief Complaint  Patient presents with  . Anemia     BP 122/76   Pulse 91   Temp 36.8 C (98.2 F)   Ht 152.4 cm (5')   Wt 65.3 kg (144 lb)   SpO2 94%   BMI 28.12 kg/m       Past Medical History:  Diagnosis Date  . Arthritis   . Breast cancer (CMS-HCC) 2011   right  . Chicken pox   . Diabetes (CMS-HCC)   . Fractured elbow   . History of chemotherapy 2011  . History of radiation therapy 2011  . Hyperlipidemia   . Hypertension 2001  . Radial head fracture, closed 12/19/2016          Past Surgical History:  Procedure Laterality Date  . dilation and curettage of uterus  1990  . INCISIONAL BIOPSY BREAST Right 2011  . MASTECTOMY PARTIAL / LUMPECTOMY Right 2011  . port a cath placement   2011  . port a cath removal   2012  . wrist surgery  1998              OB History    Gravida  2   Para  1   Term      Preterm      AB      Living        SAB      IAB      Ectopic      Molar      Multiple      Live Births          Obstetric Comments  Age at first period 64 Age of first pregnancy 51         Social History          Socioeconomic History  . Marital status: Widowed    Spouse name: Not on file  . Number of children: Not on file  . Years of education: Not on file  . Highest education level: Not on file   Occupational History  . Not on file  Tobacco Use  . Smoking status: Never Smoker  . Smokeless tobacco: Never Used  Substance and Sexual Activity  . Alcohol use: Never  . Drug use: Never  . Sexual activity: Not on file  Other Topics Concern  . Not on file  Social History Narrative  . Not on file   Social Determinants of Health   Financial Resource Strain: Not on file  Food Insecurity: Not on file  Transportation Needs: Not on file            Allergies  Allergen Reactions  . Penicillins Itching and Other (See Comments)    Other Reaction: BREAK OUT  . Azithromycin Swelling  . Codeine Unknown    Current Medications        Current  Outpatient Medications  Medication Sig Dispense Refill  . anastrozole (ARIMIDEX) 1 mg tablet Take 1 tablet by mouth once daily    . carvediloL (COREG) 12.5 MG tablet Take 1 tablet by mouth 2 (two) times daily    . cholecalciferol (VITAMIN D3) 1000 unit capsule Take 1 capsule by mouth once daily    . cyanocobalamin, vitamin B-12, (VITAMIN B-12) 1,000 mcg TbER Take by mouth once daily    . FLUoxetine (PROZAC) 40 MG capsule Take 1 capsule by mouth once daily    . fluticasone propionate (FLONASE) 50 mcg/actuation nasal spray Place 2 sprays into both nostrils once daily    . glipiZIDE (GLUCOTROL XL) 5 MG XL tablet Take 5 mg by mouth once daily       . losartan-hydrochlorothiazide (HYZAAR) 50-12.5 mg tablet Take 1 tablet by mouth once daily    . metFORMIN (GLUCOPHAGE) 1000 MG tablet TAKE 1 TABLET BY MOUTH TWICE A DAY FOR DIABETES    . omeprazole (PRILOSEC) 20 MG DR capsule Take 1 capsule by mouth once daily    . simvastatin (ZOCOR) 40 MG tablet Take 1 tablet by mouth nightly     No current facility-administered medications for this visit.           Family History  Problem Relation Age of Onset  . Arthritis Mother   . Alzheimer's disease Mother   . Arthritis Father   . Heart disease Father   . Arthritis  Maternal Grandmother   . Breast cancer Maternal Grandmother   . Alzheimer's disease Maternal Grandmother   . Stroke Maternal Grandfather   . Colon cancer Neg Hx        Review of Systems  Constitutional: Negative for chills and fever.  Respiratory: Negative for cough.   Gastrointestinal:       Dysphagia, occasional retrosternal pain.        Objective:   Physical Exam Exam conducted with a chaperone present.  Constitutional:      Appearance: Normal appearance.  Cardiovascular:     Rate and Rhythm: Normal rate and regular rhythm.     Pulses: Normal pulses.     Heart sounds: Normal heart sounds.  Pulmonary:     Effort: Pulmonary effort is normal.     Breath sounds: Normal breath sounds.  Musculoskeletal:     Cervical back: Neck supple.  Skin:    General: Skin is warm and dry.  Neurological:     Mental Status: She is alert and oriented to person, place, and time.  Psychiatric:        Mood and Affect: Mood normal.        Behavior: Behavior normal.    Labs and Radiology:   Medical oncology assessment September 17, 2020:  ASSESSMENT & PLAN: Malignant neoplasm of overlapping sites of right breast ([place]) # STAGE I Breast ca ER/PR-Pos; her 2 Neu NEG [high risk oncotype]. Patient Arimidex/extended durationx10 years. I think is reasonable to stop anastrozole at this time. Clinically noticed recurrence. Stable.  #Tingling and numbness involving right lower extremity/radiating down her leg-no pain question sciatica.If not improved recommend follow-up with PCP/imaging.  # Mild anemia- Hb 11.5 [ Dec 2021cologard-NEG;February 2022-ferritin 9 saturation 11%-concerning for iron deficiency.Will reach out to Dr. Bary Castilla regarding colonoscopy/EGD.  # [id number]- T score-: -0.9; on ca=vit D-STABLE  Cologuard July 09, 2020:  Negative.  Laboratory summary:  Component Ref Range & Units 4 d ago 6 mo ago 8 mo ago 1 yr ago 2 yr ago 4 yr ago  5 yr ago   WBC 4.0 - 10.5 K/uL 6.3  7.2  5.7 R  6.9  6.2 R  8.7  7.6 R   RBC 3.87 - 5.11 MIL/uL 4.02  3.78Low  4.07 R  3.87  3.97 R  4.04 R  4.06 R   Hemoglobin 12.0 - 15.0 g/dL 11.5Low  11.2Low  12.2 R  11.9Low  12.4 R  12.1  12.1 R   HCT 36.0 - 46.0 % 35.8Low  33.8Low  36.7 R  34.9Low  36.4 R  36.2  36.5 R   MCV 80.0 - 100.0 fL 89.1  89.4  90 R  90.2  91.6  89.6 R  89.9   MCH 26.0 - 34.0 pg 28.6  29.6  30.0 R  30.7  31.2   29.9   MCHC 30.0 - 36.0 g/dL 32.1  33.1  33.2 R  34.1  34.0 R  33.5  33.2 R   RDW 11.5 - 15.5 % 12.7  12.6  12.5 R  12.2  12.9 R  13.4  13.1 R   Platelets 150 - 400 K/uL 455High  406High  457High R  402High  422 R  445.0High R  429 R     Component Ref Range & Units 4 d ago 1 yr ago  Ferritin 11 - 307 ng/mL 7Low  13 CM        Assessment:     Candidate for endoscopy to assess for source of falling ferritin level.    Plan:     The patient reports her dysphagia is episodic and has been present over several years, unlikely that bleeding is occurring from the GE junction or stomach, but warrants investigation.  If she desired, we could start with an EGD alone and if this was normal have her return for a colonoscopy.  She was frustrated that she had undergone Cologuard testing which was indeed negative an hour talking about doing a colonoscopy.  If indeed she is bleeding, no other imaging modality will likely be able to identify the source.  The risks of colonoscopy were reviewed.  Patient to call the office with her decision. Option 1. Upper endoscopy with potentially having to have a lower endoscopy completed if upper endoscopy is negative versus Option 2. Upper and lower endoscopy.  The patient has been provided with instructions for both procedures.     Entered by Ledell Noss, CMA, acting as a scribe for Dr. Hervey Ard, MD.   The documentation recorded by the scribe accurately reflects the service I personally performed and the  decisions made by me.   Robert Bellow, MD FACS

## 2020-09-24 ENCOUNTER — Other Ambulatory Visit: Payer: Self-pay | Admitting: Family Medicine

## 2020-09-24 DIAGNOSIS — I10 Essential (primary) hypertension: Secondary | ICD-10-CM

## 2020-09-24 MED ORDER — VALSARTAN-HYDROCHLOROTHIAZIDE 80-12.5 MG PO TABS: 1.0000 | ORAL_TABLET | Freq: Every day | ORAL | 1 refills | Status: DC

## 2020-10-08 ENCOUNTER — Other Ambulatory Visit: Payer: Self-pay | Admitting: Internal Medicine

## 2020-10-08 DIAGNOSIS — Z79811 Long term (current) use of aromatase inhibitors: Secondary | ICD-10-CM

## 2020-10-08 DIAGNOSIS — C50811 Malignant neoplasm of overlapping sites of right female breast: Secondary | ICD-10-CM

## 2020-10-18 ENCOUNTER — Other Ambulatory Visit
Admission: RE | Admit: 2020-10-18 | Discharge: 2020-10-18 | Disposition: A | Discharge status: Home / Self Care | Payer: Medicare HMO | Source: Ambulatory Visit | Attending: General Surgery | Admitting: General Surgery

## 2020-10-18 ENCOUNTER — Encounter: Payer: Self-pay | Admitting: General Surgery

## 2020-10-18 ENCOUNTER — Other Ambulatory Visit: Payer: Self-pay

## 2020-10-18 DIAGNOSIS — Z01812 Encounter for preprocedural laboratory examination: Secondary | ICD-10-CM | POA: Insufficient documentation

## 2020-10-18 DIAGNOSIS — Z20822 Contact with and (suspected) exposure to covid-19: Secondary | ICD-10-CM | POA: Insufficient documentation

## 2020-10-18 LAB — SARS CORONAVIRUS 2 (TAT 6-24 HRS): SARS Coronavirus 2: NEGATIVE

## 2020-10-19 ENCOUNTER — Encounter: Payer: Self-pay | Admitting: General Surgery

## 2020-10-20 ENCOUNTER — Ambulatory Visit: Payer: Medicare HMO | Admitting: Registered Nurse

## 2020-10-20 ENCOUNTER — Ambulatory Visit
Admission: RE | Admit: 2020-10-20 | Discharge: 2020-10-20 | Disposition: A | Discharge status: Home / Self Care | Payer: Medicare HMO | Attending: General Surgery | Admitting: General Surgery

## 2020-10-20 ENCOUNTER — Encounter
Admission: RE | Disposition: A | Discharge status: Home / Self Care | Payer: Self-pay | Source: Home / Self Care | Attending: General Surgery

## 2020-10-20 ENCOUNTER — Other Ambulatory Visit: Payer: Self-pay

## 2020-10-20 ENCOUNTER — Encounter: Payer: Self-pay | Admitting: General Surgery

## 2020-10-20 DIAGNOSIS — Z880 Allergy status to penicillin: Secondary | ICD-10-CM | POA: Insufficient documentation

## 2020-10-20 DIAGNOSIS — Z79899 Other long term (current) drug therapy: Secondary | ICD-10-CM | POA: Diagnosis not present

## 2020-10-20 DIAGNOSIS — Z7984 Long term (current) use of oral hypoglycemic drugs: Secondary | ICD-10-CM | POA: Diagnosis not present

## 2020-10-20 DIAGNOSIS — K449 Diaphragmatic hernia without obstruction or gangrene: Secondary | ICD-10-CM | POA: Insufficient documentation

## 2020-10-20 DIAGNOSIS — Z881 Allergy status to other antibiotic agents status: Secondary | ICD-10-CM | POA: Insufficient documentation

## 2020-10-20 DIAGNOSIS — D509 Iron deficiency anemia, unspecified: Secondary | ICD-10-CM | POA: Insufficient documentation

## 2020-10-20 DIAGNOSIS — Z9221 Personal history of antineoplastic chemotherapy: Secondary | ICD-10-CM | POA: Insufficient documentation

## 2020-10-20 DIAGNOSIS — Z923 Personal history of irradiation: Secondary | ICD-10-CM | POA: Diagnosis not present

## 2020-10-20 HISTORY — PX: ESOPHAGOGASTRODUODENOSCOPY (EGD) WITH PROPOFOL: SHX5813

## 2020-10-20 LAB — GLUCOSE, CAPILLARY: Glucose-Capillary: 174 mg/dL — ABNORMAL HIGH (ref 70–99)

## 2020-10-20 SURGERY — ESOPHAGOGASTRODUODENOSCOPY (EGD) WITH PROPOFOL
Anesthesia: General

## 2020-10-20 MED ORDER — LIDOCAINE HCL (CARDIAC) PF 100 MG/5ML IV SOSY
PREFILLED_SYRINGE | INTRAVENOUS | Status: DC | PRN
  Administered 2020-10-20: 100 mg via INTRAVENOUS

## 2020-10-20 MED ORDER — PROPOFOL 10 MG/ML IV BOLUS
INTRAVENOUS | Status: DC | PRN
Start: 2020-10-20 — End: 2020-10-20
  Administered 2020-10-20 (×3): 20 mg via INTRAVENOUS
  Administered 2020-10-20: 70 mg via INTRAVENOUS
  Administered 2020-10-20: 10 mg via INTRAVENOUS

## 2020-10-20 MED ORDER — SODIUM CHLORIDE 0.9 % IV SOLN: INTRAVENOUS | Status: DC

## 2020-10-20 NOTE — H&P (Signed)
Hannah [name] [id number] 02-08-1951     HPI: 70 year old woman with developing anemia for whom upper and lower endoscopy was recommended.  The patient declined the latter based on a negative Cologuard test last year.  The possibility of the need to return for colonoscopy if a source of GI blood loss is not identified at upper endoscopy has been reviewed with the patient.  Medications Prior to Admission  Medication Sig Dispense Refill Last Dose  . anastrozole (ARIMIDEX) 1 MG tablet TAKE 1 TABLET BY MOUTH EVERY DAY 90 tablet 1 10/19/2020 at Unknown time  . carvedilol (COREG) 12.5 MG tablet TAKE 1 TABLET BY MOUTH TWICE A DAY 180 tablet 0 10/19/2020 at Unknown time  . Cholecalciferol (VITAMIN D3) 1000 UNITS CAPS Take 1 capsule by mouth daily.    10/19/2020 at Unknown time  . cyanocobalamin 1000 MCG tablet Take 1,000 mcg by mouth daily.   10/19/2020 at Unknown time  . glipiZIDE (GLUCOTROL XL) 5 MG 24 hr tablet TAKE 1 TABLET BY MOUTH EVERY DAY WITH BREAKFAST 90 tablet 1 10/19/2020 at Unknown time  . losartan-hydrochlorothiazide (HYZAAR) 50-12.5 MG tablet TAKE 1 TABLET BY MOUTH EVERY DAY 90 tablet 4 10/19/2020 at Unknown time  . metFORMIN (GLUCOPHAGE) 1000 MG tablet TAKE 1 TABLET BY MOUTH TWICE A DAY FOR DIABETES 180 tablet 1 10/19/2020 at Unknown time  . omeprazole (PRILOSEC) 20 MG capsule TAKE 1 CAPSULE BY MOUTH EVERY DAY 90 capsule 1 10/19/2020 at Unknown time  . simvastatin (ZOCOR) 40 MG tablet TAKE 1 TABLET BY MOUTH EVERYDAY AT BEDTIME 90 tablet 2 10/19/2020 at Unknown time  . valsartan-hydrochlorothiazide (DIOVAN HCT) 80-12.5 MG tablet Take 1 tablet by mouth daily. 90 tablet 1 10/19/2020 at Unknown time  . FLUoxetine (PROZAC) 40 MG capsule TAKE 1 CAPSULE BY MOUTH EVERY DAY 90 capsule 3   . fluticasone (FLONASE) 50 MCG/ACT nasal spray SPRAY 2 SPRAYS INTO EACH NOSTRIL EVERY DAY (Patient taking differently: As needed) 16 mL 1    Allergies  Allergen Reactions  . Azithromycin Swelling    Itching,  swelling, and rash  . Codeine Other (See Comments)    Mental changes  . Penicillins Itching and Rash   Past Medical History:  Diagnosis Date  . Allergy   . Arthritis   . Cancer ([place]) 2011-Right    DCIS. 9 mm histologic grade 2 invasive mammary carcinoma, ER 90%, PR 5%, HER-2/neu not over expressed. Sentinel nodes negative.  . Chicken pox   . Diabetes mellitus without complication ([place])   . Fractured elbow   . History of lumpectomy 2011   Right breast  . Hyperlipidemia   . Hypertension 2001  . Personal history of chemotherapy 2011   Right Breast  . Personal history of radiation therapy 2011   Right breast  . Radial head fracture, closed 12/19/2016   Past Surgical History:  Procedure Laterality Date  . BREAST BIOPSY Right 2011   Invasive Ductal Carcinoma  . BREAST LUMPECTOMY Right 2011  . BREAST SURGERY Right 2011    right breast wide excision, partial breast radiation.  . COLONOSCOPY  Declines   Reports she has annual stool fecal occult blood testing.  [name] DILATION AND CURETTAGE OF UTERUS  1990  . FRACTURE SURGERY    . PORT-A-CATH REMOVAL  2012  . right breast wide excision  2011  . WRIST SURGERY  1998   pins placed   Social History   Socioeconomic History  . Marital status: Widowed    Spouse name: Not on  file  . Number of children: 1  . Years of education: Not on file  . Highest education level: Bachelor's degree (e.g., BA, AB, BS)  Occupational History  . Occupation: retired  Tobacco Use  . Smoking status: Never Smoker  . Smokeless tobacco: Never Used  Vaping Use  . Vaping Use: Never used  Substance and Sexual Activity  . Alcohol use: No  . Drug use: No  . Sexual activity: Not on file  Other Topics Concern  . Not on file  Social History Narrative  . Not on file   Social Determinants of Health   Financial Resource Strain: Low Risk   . Difficulty of Paying Living Expenses: Not hard at all  Food Insecurity: No Food Insecurity  . Worried About Paediatric nurse in the Last Year: Never true  . Ran Out of Food in the Last Year: Never true  Transportation Needs: No Transportation Needs  . Lack of Transportation (Medical): No  . Lack of Transportation (Non-Medical): No  Physical Activity: Inactive  . Days of Exercise per Week: 0 days  . Minutes of Exercise per Session: 0 min  Stress: No Stress Concern Present  . Feeling of Stress : Only a little  Social Connections: Moderately Isolated  . Frequency of Communication with Friends and Family: More than three times a week  . Frequency of Social Gatherings with Friends and Family: More than three times a week  . Attends Religious Services: More than 4 times per year  . Active Member of Clubs or Organizations: No  . Attends Archivist Meetings: Never  . Marital Status: Widowed  Intimate Partner Violence: Not At Risk  . Fear of Current or Ex-Partner: No  . Emotionally Abused: No  . Physically Abused: No  . Sexually Abused: No   Social History   Social History Narrative  . Not on file     ROS: Negative.     PE: HEENT: Negative. Lungs: Clear. Cardio: RR.   Assessment/Plan:  Proceed with planned endoscopy.   Forest Gleason [facility] 10/20/2020

## 2020-10-20 NOTE — Transfer of Care (Signed)
Immediate Anesthesia Transfer of Care Note  Patient: Kellyann P Mathe  Procedure(s) Performed: ESOPHAGOGASTRODUODENOSCOPY (EGD) WITH PROPOFOL (N/A )  Patient Location: PACU  Anesthesia Type:General  Level of Consciousness: sedated  Airway & Oxygen Therapy: Patient Spontanous Breathing  Post-op Assessment: Report given to RN and Post -op Vital signs reviewed and stable  Post vital signs: Reviewed and stable  Last Vitals:  Vitals Value Taken Time  BP 95/47 10/20/20 0913  Temp    Pulse 87 10/20/20 0913  Resp 17 10/20/20 0913  SpO2 94 % 10/20/20 0913  Vitals shown include unvalidated device data.  Last Pain:  Vitals:   10/20/20 0855  TempSrc: Temporal         Complications: No complications documented.

## 2020-10-20 NOTE — Op Note (Signed)
[facility] Gastroenterology Patient Name: Hannah [name] Procedure Date: 10/20/2020 8:46 AM MRN: [id number] Account #: [id number] Date of Birth: 07/11/1951 Admit Type: Outpatient Age: 70 Room: [facility] ENDO ROOM 1 Gender: Female Note Status: Finalized Procedure:             Upper GI endoscopy Indications:           Iron deficiency anemia Providers:             Robert Bellow, MD Referring MD:          Kirstie Peri. Caryn Section, MD (Referring MD) Medicines:             Propofol per Anesthesia Complications:         No immediate complications. Procedure:             Pre-Anesthesia Assessment:                        - Prior to the procedure, a History and Physical was                         performed, and patient medications, allergies and                         sensitivities were reviewed. The patient's tolerance                         of previous anesthesia was reviewed.                        - The risks and benefits of the procedure and the                         sedation options and risks were discussed with the                         patient. All questions were answered and informed                         consent was obtained.                        After obtaining informed consent, the endoscope was                         passed under direct vision. Throughout the procedure,                         the patient's blood pressure, pulse, and oxygen                         saturations were monitored continuously. The Endoscope                         was introduced through the mouth, and advanced to the                         jejunum. The upper GI endoscopy was accomplished  without difficulty. The patient tolerated the                         procedure well. Findings:      The esophagus was normal.      The examined duodenum was normal.      A small hiatal hernia was present.      The examined jejunum was normal. Impression:            -  Normal esophagus.                        - Normal examined duodenum.                        - Small hiatal hernia.                        - Normal examined jejunum.                        - No specimens collected. Recommendation:        - Perform a colonoscopy (date not yet determined). Procedure Code(s):     --- Professional ---                        [phone], Esophagogastroduodenoscopy, flexible,                         transoral; diagnostic, including collection of                         specimen(s) by brushing or washing, when performed                         (separate procedure) Diagnosis Code(s):     --- Professional ---                        K44.9, Diaphragmatic hernia without obstruction or                         gangrene                        D50.9, Iron deficiency anemia, unspecified CPT copyright 2019 American Medical Association. All rights reserved. The codes documented in this report are preliminary and upon coder review may  be revised to meet current compliance requirements. Robert Bellow, MD 10/20/2020 9:13:59 AM This report has been signed electronically. Number of Addenda: 0 Note Initiated On: 10/20/2020 8:46 AM      [facility]

## 2020-10-20 NOTE — Anesthesia Postprocedure Evaluation (Signed)
Anesthesia Post Note  Patient: Hannah [name]  Procedure(s) Performed: ESOPHAGOGASTRODUODENOSCOPY (EGD) WITH PROPOFOL (N/A )  Patient location during evaluation: Endoscopy Anesthesia Type: General Level of consciousness: awake and alert Pain management: pain level controlled Vital Signs Assessment: post-procedure vital signs reviewed and stable Respiratory status: spontaneous breathing, nonlabored ventilation, respiratory function stable and patient connected to nasal cannula oxygen Cardiovascular status: blood pressure returned to baseline and stable Postop Assessment: no apparent nausea or vomiting Anesthetic complications: no   No complications documented.   Last Vitals:  Vitals:   10/20/20 0855 10/20/20 0913  BP: (!) 152/85 (!) 95/47  Pulse: 86 86  Resp: 16 16  Temp: (!) 36.1 C (!) 36.2 C  SpO2: 95%     Last Pain:  Vitals:   10/20/20 0913  TempSrc: Temporal  PainSc: Asleep                 Arita Miss

## 2020-10-20 NOTE — Anesthesia Preprocedure Evaluation (Signed)
Anesthesia Evaluation  Patient identified by MRN, date of birth, ID band Patient awake    Reviewed: Allergy & Precautions, NPO status , Patient's Chart, lab work & pertinent test results  History of Anesthesia Complications Negative for: history of anesthetic complications  Airway Mallampati: II  TM Distance: >3 FB Neck ROM: Full    Dental  (+) Partial Upper   Pulmonary neg pulmonary ROS, neg sleep apnea, neg COPD, Patient abstained from smoking.Not current smoker,    Pulmonary exam normal breath sounds clear to auscultation       Cardiovascular Exercise Tolerance: Good METShypertension, (-) CAD and (-) Past MI (-) dysrhythmias  Rhythm:Regular Rate:Normal - Systolic murmurs    Neuro/Psych negative neurological ROS  negative psych ROS   GI/Hepatic GERD  ,(+)     (-) substance abuse  ,   Endo/Other  diabetes  Renal/GU negative Renal ROS     Musculoskeletal   Abdominal   Peds  Hematology   Anesthesia Other Findings Past Medical History: No date: Allergy No date: Arthritis 2011-Right: Cancer ([place])     Comment:   DCIS. 9 mm histologic grade 2 invasive mammary               carcinoma, ER 90%, PR 5%, HER-2/neu not over expressed.               Sentinel nodes negative. No date: Chicken pox No date: Diabetes mellitus without complication ([place]) No date: Fractured elbow 2011: History of lumpectomy     Comment:  Right breast No date: Hyperlipidemia 2001: Hypertension 2011: Personal history of chemotherapy     Comment:  Right Breast 2011: Personal history of radiation therapy     Comment:  Right breast 12/19/2016: Radial head fracture, closed  Reproductive/Obstetrics                             Anesthesia Physical Anesthesia Plan  ASA: II  Anesthesia Plan: General   Post-op Pain Management:    Induction: Intravenous  PONV Risk Score and Plan: 3 and Ondansetron, Propofol infusion  and TIVA  Airway Management Planned: Nasal Cannula  Additional Equipment: None  Intra-op Plan:   Post-operative Plan:   Informed Consent: I have reviewed the patients History and Physical, chart, labs and discussed the procedure including the risks, benefits and alternatives for the proposed anesthesia with the patient or authorized representative who has indicated his/her understanding and acceptance.     Dental advisory given  Plan Discussed with: CRNA and Surgeon  Anesthesia Plan Comments: (Discussed risks of anesthesia with patient, including possibility of difficulty with spontaneous ventilation under anesthesia necessitating airway intervention, PONV, and rare risks such as cardiac or respiratory or neurological events. Patient understands.)        Anesthesia Quick Evaluation

## 2020-10-21 ENCOUNTER — Encounter: Payer: Self-pay | Admitting: General Surgery

## 2020-11-02 ENCOUNTER — Ambulatory Visit (INDEPENDENT_AMBULATORY_CARE_PROVIDER_SITE_OTHER): Payer: Medicare HMO

## 2020-11-02 DIAGNOSIS — I10 Essential (primary) hypertension: Secondary | ICD-10-CM

## 2020-11-02 DIAGNOSIS — E119 Type 2 diabetes mellitus without complications: Secondary | ICD-10-CM

## 2020-11-02 NOTE — Chronic Care Management (AMB) (Signed)
Chronic Care Management   Follow Up Note   11/02/2020 Name: SHONTAY WALLNER MRN: [id number] DOB: 07/24/1951  Primary Care Provider: Birdie Sons, MD Reason for referral : Chronic Care Management  Noma P Breidenbach is a 70 y.o. year old female who is a primary care patient of Birdie Sons, MD. She is currently enrolled in the Chronic Care Management program.  Review of Ms. Crimi's status, including review of consultants reports, relevant labs and test results was conducted today. Collaboration with appropriate care team members was performed as part of the comprehensive evaluation and provision of chronic care management services.    SDOH (Social Determinants of Health) assessments performed: No    Outpatient Encounter Medications as of 11/02/2020  Medication Sig Note  . anastrozole (ARIMIDEX) 1 MG tablet TAKE 1 TABLET BY MOUTH EVERY DAY (Patient not taking: Reported on 11/02/2020) 11/02/2020: Reports completing dose.  . carvedilol (COREG) 12.5 MG tablet TAKE 1 TABLET BY MOUTH TWICE A DAY   . Cholecalciferol (VITAMIN D3) 1000 UNITS CAPS Take 1 capsule by mouth daily.    . cyanocobalamin 1000 MCG tablet Take 1,000 mcg by mouth daily.   [name] FLUoxetine (PROZAC) 40 MG capsule TAKE 1 CAPSULE BY MOUTH EVERY DAY   . fluticasone (FLONASE) 50 MCG/ACT nasal spray SPRAY 2 SPRAYS INTO EACH NOSTRIL EVERY DAY (Patient taking differently: As needed)   . glipiZIDE (GLUCOTROL XL) 5 MG 24 hr tablet TAKE 1 TABLET BY MOUTH EVERY DAY WITH BREAKFAST   . losartan-hydrochlorothiazide (HYZAAR) 50-12.5 MG tablet TAKE 1 TABLET BY MOUTH EVERY DAY   . metFORMIN (GLUCOPHAGE) 1000 MG tablet TAKE 1 TABLET BY MOUTH TWICE A DAY FOR DIABETES   . omeprazole (PRILOSEC) 20 MG capsule TAKE 1 CAPSULE BY MOUTH EVERY DAY   . simvastatin (ZOCOR) 40 MG tablet TAKE 1 TABLET BY MOUTH EVERYDAY AT BEDTIME   . valsartan-hydrochlorothiazide (DIOVAN HCT) 80-12.5 MG tablet Take 1 tablet by mouth daily.    No facility-administered  encounter medications on file as of 11/02/2020.     Objective:  Patient Care Plan: Diabetes Type 2 (Adult)    Problem Identified: Disease Progression (Diabetes, Type 2)     Long-Range Goal: Disease Progression Prevented or Minimized   Start Date: 06/03/2020  Expected End Date: 09/28/2020  Priority: High  Note:   Lab Results  Component Value Date   HGBA1C 7.3 (A) 08/24/2020     Current Barriers:  . Chronic disease management support and educational needs related to Diabetes self-management   Case Manager Clinical Goal(s):  [name] Over the next 120 days, patient will demonstrate improved adherence to prescribed treatment plan for Diabetes management as evidenced by taking medications as prescribed, monitoring and recording of CBG, and adherence to a ADA/ carb modified diet.   Interventions:  . Collaboration with Birdie Sons, MD regarding development and update of comprehensive plan of care as evidenced by provider attestation and co-signature . Inter-disciplinary care team collaboration (see longitudinal plan of care) . Reviewed medications. Reports excellent compliance. Encouraged to continue taking as prescribed. Encouraged to update the care management team with concerns regarding prescription cost. . Discussed blood glucose readings and compliance with diabetic diet. Reports not monitoring her blood glucose levels consistently. She is aware of s/sx of hypoglycemia, hyperglycemia and appropriate interventions. She denies symptoms over the past few weeks. Reports doing well with her nutritional intake. Her A1C is not at goal. Declined need for additional resources r/t diabetes management.   Patient Goals/Self-Care Activities - Self-administer  medications as prescribed - Attend all scheduled provider appointments - Monitor blood glucose levels consistently and utilize recommended interventions - Adhere to prescribed ADA/carb modified - Notify provider or care management team with  questions and new concerns as needed   Follow Up Plan:  Will follow up in two months          PLAN A member of the care management team will follow up with Ms. Nevel in two months.    [name] [facility] Care Management [facility] [phone]

## 2020-11-03 ENCOUNTER — Other Ambulatory Visit: Payer: Self-pay | Admitting: Family Medicine

## 2020-11-03 DIAGNOSIS — C50911 Malignant neoplasm of unspecified site of right female breast: Secondary | ICD-10-CM

## 2020-11-08 NOTE — Patient Instructions (Signed)
Thank you for allowing the Chronic Care Management team to participate in your care.    Goals Addressed: Patient Care Plan: Diabetes Type 2 (Adult)    Problem Identified: Disease Progression (Diabetes, Type 2)     Long-Range Goal: Disease Progression Prevented or Minimized   Start Date: 06/03/2020  Expected End Date: 09/28/2020  Priority: High  Note:   Lab Results  Component Value Date   HGBA1C 7.3 (A) 08/24/2020     Current Barriers:  . Chronic disease management support and educational needs related to Diabetes self-management   Case Manager Clinical Goal(s):  [name] Over the next 120 days, patient will demonstrate improved adherence to prescribed treatment plan for Diabetes management as evidenced by taking medications as prescribed, monitoring and recording of CBG, and adherence to a ADA/ carb modified diet.   Interventions:  . Collaboration with Birdie Sons, MD regarding development and update of comprehensive plan of care as evidenced by provider attestation and co-signature . Inter-disciplinary care team collaboration (see longitudinal plan of care) . Reviewed medications. Reports excellent compliance. Encouraged to continue taking as prescribed. Encouraged to update the care management team with concerns regarding prescription cost. . Discussed blood glucose readings and compliance with diabetic diet. Reports not monitoring her blood glucose levels consistently. She is aware of s/sx of hypoglycemia, hyperglycemia and appropriate interventions. She denies symptoms over the past few weeks. Reports doing well with her nutritional intake. Her A1C is not at goal. Declined need for additional resources r/t diabetes management.   Patient Goals/Self-Care Activities - Self-administer medications as prescribed - Attend all scheduled provider appointments - Monitor blood glucose levels consistently and utilize recommended interventions - Adhere to prescribed ADA/carb modified -  Notify provider or care management team with questions and new concerns as needed   Follow Up Plan:  Will follow up in two months          Hannah [name] verbalized understanding of the information discussed during the telephonic outreach today. Declined need for mailed/printed instructions. A member of the care management team will follow up with Hannah [name] in two months.    Hannah [name] [facility] [phone]

## 2020-11-09 ENCOUNTER — Other Ambulatory Visit: Payer: Self-pay | Admitting: General Surgery

## 2020-11-09 NOTE — Progress Notes (Signed)
Subjective:     Patient ID: Hannah [name] is a 70 y.o. female.  HPI  The following portions of the patient's history were reviewed and updated as appropriate.  This an established patient is here today for: office visit. The patient has been referred by Dr. Rogue Bussing for evaluation of an upper and lower endoscopy due to iron deficiency anemia. The patient states she does not want to have a colonoscopy since she recently had a cologuard done in December and reports it was negative.   The patient reports her mother passed away in 10/04/22 of whom she was the primary caregiver for 8+ years.      Chief Complaint  Patient presents with  . Anemia     BP 122/76   Pulse 91   Temp 36.8 C (98.2 F)   Ht 152.4 cm (5')   Wt 65.3 kg (144 lb)   SpO2 94%   BMI 28.12 kg/m       Past Medical History:  Diagnosis Date  . Arthritis   . Breast cancer (CMS-HCC) 2011   right  . Chicken pox   . Diabetes (CMS-HCC)   . Fractured elbow   . History of chemotherapy 2011  . History of radiation therapy 2011  . Hyperlipidemia   . Hypertension 2001  . Radial head fracture, closed 12/19/2016          Past Surgical History:  Procedure Laterality Date  . dilation and curettage of uterus  1990  . INCISIONAL BIOPSY BREAST Right 2011  . MASTECTOMY PARTIAL / LUMPECTOMY Right 2011  . port a cath placement   2011  . port a cath removal   2012  . wrist surgery  1998              OB History    Gravida  2   Para  1   Term      Preterm      AB      Living        SAB      IAB      Ectopic      Molar      Multiple      Live Births          Obstetric Comments  Age at first period 64 Age of first pregnancy 51         Social History          Socioeconomic History  . Marital status: Widowed    Spouse name: Not on file  . Number of children: Not on file  . Years of education: Not on file  . Highest education level: Not on file   Occupational History  . Not on file  Tobacco Use  . Smoking status: Never Smoker  . Smokeless tobacco: Never Used  Substance and Sexual Activity  . Alcohol use: Never  . Drug use: Never  . Sexual activity: Not on file  Other Topics Concern  . Not on file  Social History Narrative  . Not on file   Social Determinants of Health   Financial Resource Strain: Not on file  Food Insecurity: Not on file  Transportation Needs: Not on file            Allergies  Allergen Reactions  . Penicillins Itching and Other (See Comments)    Other Reaction: BREAK OUT  . Azithromycin Swelling  . Codeine Unknown    Current Medications        Current  Outpatient Medications  Medication Sig Dispense Refill  . anastrozole (ARIMIDEX) 1 mg tablet Take 1 tablet by mouth once daily    . carvediloL (COREG) 12.5 MG tablet Take 1 tablet by mouth 2 (two) times daily    . cholecalciferol (VITAMIN D3) 1000 unit capsule Take 1 capsule by mouth once daily    . cyanocobalamin, vitamin B-12, (VITAMIN B-12) 1,000 mcg TbER Take by mouth once daily    . FLUoxetine (PROZAC) 40 MG capsule Take 1 capsule by mouth once daily    . fluticasone propionate (FLONASE) 50 mcg/actuation nasal spray Place 2 sprays into both nostrils once daily    . glipiZIDE (GLUCOTROL XL) 5 MG XL tablet Take 5 mg by mouth once daily       . losartan-hydrochlorothiazide (HYZAAR) 50-12.5 mg tablet Take 1 tablet by mouth once daily    . metFORMIN (GLUCOPHAGE) 1000 MG tablet TAKE 1 TABLET BY MOUTH TWICE A DAY FOR DIABETES    . omeprazole (PRILOSEC) 20 MG DR capsule Take 1 capsule by mouth once daily    . simvastatin (ZOCOR) 40 MG tablet Take 1 tablet by mouth nightly     No current facility-administered medications for this visit.           Family History  Problem Relation Age of Onset  . Arthritis Mother   . Alzheimer's disease Mother   . Arthritis Father   . Heart disease Father   . Arthritis  Maternal Grandmother   . Breast cancer Maternal Grandmother   . Alzheimer's disease Maternal Grandmother   . Stroke Maternal Grandfather   . Colon cancer Neg Hx        Review of Systems  Constitutional: Negative for chills and fever.  Respiratory: Negative for cough.   Gastrointestinal:       Dysphagia, occasional retrosternal pain.        Objective:   Physical Exam Exam conducted with a chaperone present.  Constitutional:      Appearance: Normal appearance.  Cardiovascular:     Rate and Rhythm: Normal rate and regular rhythm.     Pulses: Normal pulses.     Heart sounds: Normal heart sounds.  Pulmonary:     Effort: Pulmonary effort is normal.     Breath sounds: Normal breath sounds.  Musculoskeletal:     Cervical back: Neck supple.  Skin:    General: Skin is warm and dry.  Neurological:     Mental Status: She is alert and oriented to person, place, and time.  Psychiatric:        Mood and Affect: Mood normal.        Behavior: Behavior normal.    Labs and Radiology:   Medical oncology assessment September 17, 2020:  ASSESSMENT & PLAN: Malignant neoplasm of overlapping sites of right breast ([place]) # STAGE I Breast ca ER/PR-Pos; her 2 Neu NEG [high risk oncotype]. Patient Arimidex/extended durationx10 years. I think is reasonable to stop anastrozole at this time. Clinically noticed recurrence. Stable.  #Tingling and numbness involving right lower extremity/radiating down her leg-no pain question sciatica.If not improved recommend follow-up with PCP/imaging.  # Mild anemia- Hb 11.5 [ Dec 2021cologard-NEG;February 2022-ferritin 9 saturation 11%-concerning for iron deficiency.Will reach out to Dr. Bary Castilla regarding colonoscopy/EGD.  # [id number]- T score-: -0.9; on ca=vit D-STABLE  Cologuard July 09, 2020:  Negative.  Laboratory summary:  Component Ref Range & Units 4 d ago 6 mo ago 8 mo ago 1 yr ago 2 yr ago 4 yr ago  5 yr ago   WBC 4.0 - 10.5 K/uL 6.3  7.2  5.7 R  6.9  6.2 R  8.7  7.6 R   RBC 3.87 - 5.11 MIL/uL 4.02  3.78Low  4.07 R  3.87  3.97 R  4.04 R  4.06 R   Hemoglobin 12.0 - 15.0 g/dL 11.5Low  11.2Low  12.2 R  11.9Low  12.4 R  12.1  12.1 R   HCT 36.0 - 46.0 % 35.8Low  33.8Low  36.7 R  34.9Low  36.4 R  36.2  36.5 R   MCV 80.0 - 100.0 fL 89.1  89.4  90 R  90.2  91.6  89.6 R  89.9   MCH 26.0 - 34.0 pg 28.6  29.6  30.0 R  30.7  31.2   29.9   MCHC 30.0 - 36.0 g/dL 32.1  33.1  33.2 R  34.1  34.0 R  33.5  33.2 R   RDW 11.5 - 15.5 % 12.7  12.6  12.5 R  12.2  12.9 R  13.4  13.1 R   Platelets 150 - 400 K/uL 455High  406High  457High R  402High  422 R  445.0High R  429 R     Component Ref Range & Units 4 d ago 1 yr ago  Ferritin 11 - 307 ng/mL 7Low  13 CM        Assessment:     Candidate for endoscopy to assess for source of falling ferritin level.    Plan:     The patient reports her dysphagia is episodic and has been present over several years, unlikely that bleeding is occurring from the GE junction or stomach, but warrants investigation.  If she desired, we could start with an EGD alone and if this was normal have her return for a colonoscopy.  She was frustrated that she had undergone Cologuard testing which was indeed negative an hour talking about doing a colonoscopy.  If indeed she is bleeding, no other imaging modality will likely be able to identify the source.  The risks of colonoscopy were reviewed.  Patient to call the office with her decision. Option 1. Upper endoscopy with potentially having to have a lower endoscopy completed if upper endoscopy is negative versus Option 2. Upper and lower endoscopy.  The patient has been provided with instructions for both procedures.     Entered by Ledell Noss, CMA, acting as a scribe for Dr. Hervey Ard, MD.   The documentation recorded by the scribe accurately reflects the service I personally performed and the  decisions made by me.   Robert Bellow, MD FACS   Upper endoscopy completd October 20, 2020 did not identify a source of blood loss.

## 2020-11-22 ENCOUNTER — Other Ambulatory Visit: Payer: Medicare HMO

## 2020-11-24 ENCOUNTER — Ambulatory Visit: Payer: Medicare HMO | Admitting: Certified Registered Nurse Anesthetist

## 2020-11-24 ENCOUNTER — Other Ambulatory Visit: Payer: Self-pay

## 2020-11-24 ENCOUNTER — Encounter: Payer: Self-pay | Admitting: General Surgery

## 2020-11-24 ENCOUNTER — Encounter
Admission: RE | Disposition: A | Discharge status: Home / Self Care | Payer: Self-pay | Source: Home / Self Care | Attending: General Surgery

## 2020-11-24 ENCOUNTER — Ambulatory Visit
Admission: RE | Admit: 2020-11-24 | Discharge: 2020-11-24 | Disposition: A | Discharge status: Home / Self Care | Payer: Medicare HMO | Attending: General Surgery | Admitting: General Surgery

## 2020-11-24 DIAGNOSIS — Z885 Allergy status to narcotic agent status: Secondary | ICD-10-CM | POA: Diagnosis not present

## 2020-11-24 DIAGNOSIS — Z881 Allergy status to other antibiotic agents status: Secondary | ICD-10-CM | POA: Insufficient documentation

## 2020-11-24 DIAGNOSIS — Z882 Allergy status to sulfonamides status: Secondary | ICD-10-CM | POA: Insufficient documentation

## 2020-11-24 DIAGNOSIS — Z79899 Other long term (current) drug therapy: Secondary | ICD-10-CM | POA: Diagnosis not present

## 2020-11-24 DIAGNOSIS — D509 Iron deficiency anemia, unspecified: Secondary | ICD-10-CM | POA: Insufficient documentation

## 2020-11-24 DIAGNOSIS — Z7984 Long term (current) use of oral hypoglycemic drugs: Secondary | ICD-10-CM | POA: Insufficient documentation

## 2020-11-24 DIAGNOSIS — K219 Gastro-esophageal reflux disease without esophagitis: Secondary | ICD-10-CM | POA: Diagnosis not present

## 2020-11-24 DIAGNOSIS — E785 Hyperlipidemia, unspecified: Secondary | ICD-10-CM | POA: Diagnosis not present

## 2020-11-24 HISTORY — PX: COLONOSCOPY WITH PROPOFOL: SHX5780

## 2020-11-24 SURGERY — COLONOSCOPY WITH PROPOFOL
Anesthesia: General

## 2020-11-24 MED ORDER — PROPOFOL 10 MG/ML IV BOLUS
INTRAVENOUS | Status: DC | PRN
  Administered 2020-11-24 (×2): 20 mg via INTRAVENOUS
  Administered 2020-11-24: 60 mg via INTRAVENOUS

## 2020-11-24 MED ORDER — PROPOFOL 500 MG/50ML IV EMUL
INTRAVENOUS | Status: AC
  Filled 2020-11-24: qty 250

## 2020-11-24 MED ORDER — SODIUM CHLORIDE 0.9 % IV SOLN: INTRAVENOUS | Status: DC

## 2020-11-24 MED ORDER — PROPOFOL 500 MG/50ML IV EMUL
INTRAVENOUS | Status: DC | PRN
  Administered 2020-11-24: 150 ug/kg/min via INTRAVENOUS

## 2020-11-24 NOTE — Anesthesia Postprocedure Evaluation (Signed)
Anesthesia Post Note  Patient: Chalonda P Bekele  Procedure(s) Performed: COLONOSCOPY WITH PROPOFOL (N/A )  Patient location during evaluation: Endoscopy Anesthesia Type: General Level of consciousness: awake and alert Pain management: pain level controlled Vital Signs Assessment: post-procedure vital signs reviewed and stable Respiratory status: spontaneous breathing, nonlabored ventilation, respiratory function stable and patient connected to nasal cannula oxygen Cardiovascular status: blood pressure returned to baseline and stable Postop Assessment: no apparent nausea or vomiting Anesthetic complications: no   No complications documented.   Last Vitals:  Vitals:   11/24/20 0820 11/24/20 0830  BP: 137/74 (!) 147/78  Pulse: 79   Resp: (!) 24 (!) 23  Temp:    SpO2: 97% 97%    Last Pain:  Vitals:   11/24/20 0657  TempSrc: Temporal  PainSc: 0-No pain                 Precious Haws Tessie Ordaz

## 2020-11-24 NOTE — Transfer of Care (Signed)
Immediate Anesthesia Transfer of Care Note  Patient: Hannah [name]  Procedure(s) Performed: COLONOSCOPY WITH PROPOFOL (N/A )  Patient Location: PACU  Anesthesia Type:General  Level of Consciousness: awake and alert   Airway & Oxygen Therapy: Patient Spontanous Breathing  Post-op Assessment: Report given to RN and Post -op Vital signs reviewed and stable  Post vital signs: Reviewed and stable  Last Vitals:  Vitals Value Taken Time  BP 110/84   Temp    Pulse 92 11/24/20 0759  Resp 23 11/24/20 0759  SpO2 98 % 11/24/20 0759  Vitals shown include unvalidated device data.  Last Pain:  Vitals:   11/24/20 0657  TempSrc: Temporal  PainSc: 0-No pain         Complications: No complications documented.

## 2020-11-24 NOTE — Anesthesia Preprocedure Evaluation (Addendum)
Anesthesia Evaluation  Patient identified by MRN, date of birth, ID band Patient awake    Reviewed: Allergy & Precautions, NPO status , Patient's Chart, lab work & pertinent test results  History of Anesthesia Complications Negative for: history of anesthetic complications  Airway Mallampati: II  TM Distance: >3 FB Neck ROM: Full    Dental  (+) Chipped   Pulmonary neg pulmonary ROS, neg sleep apnea, neg COPD, Patient abstained from smoking.Not current smoker,    Pulmonary exam normal breath sounds clear to auscultation       Cardiovascular Exercise Tolerance: Good METShypertension, (-) CAD and (-) Past MI (-) dysrhythmias  - Systolic murmurs    Neuro/Psych negative neurological ROS  negative psych ROS   GI/Hepatic GERD  ,(+)     (-) substance abuse  ,   Endo/Other  diabetes  Renal/GU negative Renal ROS     Musculoskeletal   Abdominal   Peds  Hematology   Anesthesia Other Findings Past Medical History: No date: Allergy No date: Arthritis 2011-Right: Cancer ([place])     Comment:   DCIS. 9 mm histologic grade 2 invasive mammary               carcinoma, ER 90%, PR 5%, HER-2/neu not over expressed.               Sentinel nodes negative. No date: Chicken pox No date: Diabetes mellitus without complication ([place]) No date: Fractured elbow 2011: History of lumpectomy     Comment:  Right breast No date: Hyperlipidemia 2001: Hypertension 2011: Personal history of chemotherapy     Comment:  Right Breast 2011: Personal history of radiation therapy     Comment:  Right breast 12/19/2016: Radial head fracture, closed  Reproductive/Obstetrics                            Anesthesia Physical  Anesthesia Plan  ASA: III  Anesthesia Plan: General   Post-op Pain Management:    Induction: Intravenous  PONV Risk Score and Plan: 3 and Propofol infusion  Airway Management Planned: Nasal  Cannula  Additional Equipment:   Intra-op Plan:   Post-operative Plan:   Informed Consent: I have reviewed the patients History and Physical, chart, labs and discussed the procedure including the risks, benefits and alternatives for the proposed anesthesia with the patient or authorized representative who has indicated his/her understanding and acceptance.     Dental advisory given  Plan Discussed with: CRNA and Surgeon  Anesthesia Plan Comments: (Patient consented for risks of anesthesia including but not limited to:  - adverse reactions to medications - risk of airway placement if required - damage to eyes, teeth, lips or other oral mucosa - nerve damage due to positioning  - sore throat or hoarseness - Damage to heart, brain, nerves, lungs, other parts of body or loss of life  Patient voiced understanding.)       Anesthesia Quick Evaluation

## 2020-11-24 NOTE — H&P (Signed)
Hannah [name] [id number] [birth year]     HPI:  70 y/o with falling serum ferritin. Negative EGD earlier this month. For colonoscopy.   Medications Prior to Admission  Medication Sig Dispense Refill Last Dose  . anastrozole (ARIMIDEX) 1 MG tablet TAKE 1 TABLET BY MOUTH EVERY DAY 90 tablet 1 11/23/2020 at Unknown time  . carvedilol (COREG) 12.5 MG tablet TAKE 1 TABLET BY MOUTH TWICE A DAY 180 tablet 0 11/23/2020 at Unknown time  . Cholecalciferol (VITAMIN D3) 1000 UNITS CAPS Take 1 capsule by mouth daily.    11/23/2020 at Unknown time  . cyanocobalamin 1000 MCG tablet Take 1,000 mcg by mouth daily.   11/23/2020 at Unknown time  . FLUoxetine (PROZAC) 40 MG capsule TAKE 1 CAPSULE BY MOUTH EVERY DAY 90 capsule 3 11/23/2020 at Unknown time  . fluticasone (FLONASE) 50 MCG/ACT nasal spray SPRAY 2 SPRAYS INTO EACH NOSTRIL EVERY DAY (Patient taking differently: As needed) 16 mL 1 11/23/2020 at Unknown time  . glipiZIDE (GLUCOTROL XL) 5 MG 24 hr tablet TAKE 1 TABLET BY MOUTH EVERY DAY WITH BREAKFAST 90 tablet 1 11/23/2020 at Unknown time  . losartan-hydrochlorothiazide (HYZAAR) 50-12.5 MG tablet TAKE 1 TABLET BY MOUTH EVERY DAY 90 tablet 4 11/23/2020 at Unknown time  . omeprazole (PRILOSEC) 20 MG capsule TAKE 1 CAPSULE BY MOUTH EVERY DAY 90 capsule 1 11/23/2020 at Unknown time  . simvastatin (ZOCOR) 40 MG tablet TAKE 1 TABLET BY MOUTH EVERYDAY AT BEDTIME 90 tablet 2 11/23/2020 at Unknown time  . valsartan-hydrochlorothiazide (DIOVAN HCT) 80-12.5 MG tablet Take 1 tablet by mouth daily. 90 tablet 1 11/23/2020 at Unknown time  . metFORMIN (GLUCOPHAGE) 1000 MG tablet TAKE 1 TABLET BY MOUTH TWICE A DAY FOR DIABETES 180 tablet 1 11/22/2020   Allergies  Allergen Reactions  . Azithromycin Swelling    Itching, swelling, and rash  . Codeine Other (See Comments)    Mental changes  . Penicillins Itching and Rash   Past Medical History:  Diagnosis Date  . Allergy   . Arthritis   . Cancer ([place]) 2011-Right    DCIS. 9  mm histologic grade 2 invasive mammary carcinoma, ER 90%, PR 5%, HER-2/neu not over expressed. Sentinel nodes negative.  . Chicken pox   . Diabetes mellitus without complication ([place])   . Fractured elbow   . History of lumpectomy 2011   Right breast  . Hyperlipidemia   . Hypertension 2001  . Personal history of chemotherapy 2011   Right Breast  . Personal history of radiation therapy 2011   Right breast  . Radial head fracture, closed 12/19/2016   Past Surgical History:  Procedure Laterality Date  . BREAST BIOPSY Right 2011   Invasive Ductal Carcinoma  . BREAST LUMPECTOMY Right 2011  . BREAST SURGERY Right 2011    right breast wide excision, partial breast radiation.  . COLONOSCOPY  Declines   Reports she has annual stool fecal occult blood testing.  [name] DILATION AND CURETTAGE OF UTERUS  1990  . ESOPHAGOGASTRODUODENOSCOPY (EGD) WITH PROPOFOL N/A 10/20/2020   Procedure: ESOPHAGOGASTRODUODENOSCOPY (EGD) WITH PROPOFOL;  Surgeon: Robert Bellow, MD;  Location: [facility] ENDOSCOPY;  Service: Endoscopy;  Laterality: N/A;  . FRACTURE SURGERY    . PORT-A-CATH REMOVAL  2012  . right breast wide excision  2011  . WRIST SURGERY  1998   pins placed   Social History   Socioeconomic History  . Marital status: Widowed    Spouse name: Not on file  . Number of children: 1  .  Years of education: Not on file  . Highest education level: Bachelor's degree (e.g., BA, AB, BS)  Occupational History  . Occupation: retired  Tobacco Use  . Smoking status: Never Smoker  . Smokeless tobacco: Never Used  Vaping Use  . Vaping Use: Never used  Substance and Sexual Activity  . Alcohol use: No  . Drug use: No  . Sexual activity: Not on file  Other Topics Concern  . Not on file  Social History Narrative  . Not on file   Social Determinants of Health   Financial Resource Strain: Low Risk   . Difficulty of Paying Living Expenses: Not hard at all  Food Insecurity: No Food Insecurity  . Worried  About Charity fundraiser in the Last Year: Never true  . Ran Out of Food in the Last Year: Never true  Transportation Needs: No Transportation Needs  . Lack of Transportation (Medical): No  . Lack of Transportation (Non-Medical): No  Physical Activity: Inactive  . Days of Exercise per Week: 0 days  . Minutes of Exercise per Session: 0 min  Stress: No Stress Concern Present  . Feeling of Stress : Only a little  Social Connections: Moderately Isolated  . Frequency of Communication with Friends and Family: More than three times a week  . Frequency of Social Gatherings with Friends and Family: More than three times a week  . Attends Religious Services: More than 4 times per year  . Active Member of Clubs or Organizations: No  . Attends Archivist Meetings: Never  . Marital Status: Widowed  Intimate Partner Violence: Not At Risk  . Fear of Current or Ex-Partner: No  . Emotionally Abused: No  . Physically Abused: No  . Sexually Abused: No   Social History   Social History Narrative  . Not on file     ROS: Negative.     PE: HEENT: Negative. Lungs: Clear. Cardio: RR.   Assessment/Plan:  Proceed with planned endoscopy.   Forest Gleason [facility] 11/24/2020

## 2020-11-24 NOTE — Op Note (Signed)
[facility] Gastroenterology Patient Name: Hannah [name] Procedure Date: 11/24/2020 7:14 AM MRN: [id number] Account #: [id number] Date of Birth: 1950/12/19 Admit Type: Outpatient Age: 70 Room: [facility] ENDO ROOM 1 Gender: Female Note Status: Finalized Procedure:             Colonoscopy Indications:           Unexplained iron deficiency anemia Providers:             Robert Bellow, MD Referring MD:          Kirstie Peri. Caryn Section, MD (Referring MD) Medicines:             Propofol per Anesthesia Complications:         No immediate complications. Procedure:             Pre-Anesthesia Assessment:                        - Prior to the procedure, a History and Physical was                         performed, and patient medications, allergies and                         sensitivities were reviewed. The patient's tolerance                         of previous anesthesia was reviewed.                        - The risks and benefits of the procedure and the                         sedation options and risks were discussed with the                         patient. All questions were answered and informed                         consent was obtained.                        After obtaining informed consent, the colonoscope was                         passed under direct vision. Throughout the procedure,                         the patient's blood pressure, pulse, and oxygen                         saturations were monitored continuously. The                         Colonoscope was introduced through the anus and                         advanced to the the cecum, identified by appendiceal                         orifice and ileocecal  valve. The colonoscopy was                         performed without difficulty. The patient tolerated                         the procedure well. The quality of the bowel                         preparation was excellent. Findings:      The entire  examined colon appeared normal on direct and retroflexion       views. Impression:            - The entire examined colon is normal on direct and                         retroflexion views.                        - No specimens collected. Recommendation:        - To visualize the small bowel, perform video capsule                         endoscopy today. Procedure Code(s):     --- Professional ---                        [phone], Colonoscopy, flexible; diagnostic, including                         collection of specimen(s) by brushing or washing, when                         performed (separate procedure) Diagnosis Code(s):     --- Professional ---                        D50.9, Iron deficiency anemia, unspecified CPT copyright 2019 American Medical Association. All rights reserved. The codes documented in this report are preliminary and upon coder review may  be revised to meet current compliance requirements. Robert Bellow, MD 11/24/2020 7:54:30 AM This report has been signed electronically. Number of Addenda: 0 Note Initiated On: 11/24/2020 7:14 AM Scope Withdrawal Time: 0 hours 10 minutes 15 seconds  Total Procedure Duration: 0 hours 17 minutes 21 seconds  Estimated Blood Loss:  Estimated blood loss: none.      Erlanger Bledsoe

## 2020-11-25 ENCOUNTER — Encounter: Payer: Self-pay | Admitting: General Surgery

## 2021-01-01 ENCOUNTER — Other Ambulatory Visit: Payer: Self-pay | Admitting: Family Medicine

## 2021-01-01 NOTE — Telephone Encounter (Signed)
Requested Prescriptions  Pending Prescriptions Disp Refills  . simvastatin (ZOCOR) 40 MG tablet [Pharmacy Med Name: SIMVASTATIN 40 MG TABLET] 90 tablet 0    Sig: TAKE 1 TABLET BY MOUTH EVERYDAY AT BEDTIME     Cardiovascular:  Antilipid - Statins Passed - 01/01/2021  1:09 PM      Passed - Total Cholesterol in normal range and within 360 days    Cholesterol, Total  Date Value Ref Range Status  01/14/2020 162 100 - 199 mg/dL Final         Passed - LDL in normal range and within 360 days    LDL Cholesterol (Calc)  Date Value Ref Range Status  06/13/2017 45 mg/dL (calc) Final    Comment:    Reference range: <100 . Desirable range <100 mg/dL for primary prevention;   <70 mg/dL for patients with CHD or diabetic patients  with > or = 2 CHD risk factors. [name] LDL-C is now calculated using the Martin-Hopkins  calculation, which is a validated novel method providing  better accuracy than the Friedewald equation in the  estimation of LDL-C.  Cresenciano Genre et al. Annamaria Helling. 4818;563(14): 2061-2068  (http://education.[employer].com/faq/FAQ164)    LDL Chol Calc (NIH)  Date Value Ref Range Status  01/14/2020 68 0 - 99 mg/dL Final   Direct LDL  Date Value Ref Range Status  02/07/2016 74.0 mg/dL Final    Comment:    Optimal:  <100 mg/dLNear or Above Optimal:  100-129 mg/dLBorderline High:  130-159 mg/dLHigh:  160-189 mg/dLVery High:  >190 mg/dL         Passed - HDL in normal range and within 360 days    HDL  Date Value Ref Range Status  01/14/2020 76 >39 mg/dL Final         Passed - Triglycerides in normal range and within 360 days    Triglycerides  Date Value Ref Range Status  01/14/2020 99 0 - 149 mg/dL Final         Passed - Patient is not pregnant      Passed - Valid encounter within last 12 months    Recent Outpatient Visits          4 months ago Type 2 diabetes mellitus without complication, without long-term current use of insulin ([place])   [facility] Birdie Sons, MD   7 months ago Type 2 diabetes mellitus without complication, without long-term current use of insulin ([place])   [facility] Birdie Sons, MD   11 months ago Type 2 diabetes mellitus without complication, without long-term current use of insulin ([place])   [facility] Birdie Sons, MD   1 year ago Acute non-recurrent frontal sinusitis   [facility] Birdie Sons, MD   1 year ago Type 2 diabetes mellitus without complication, without long-term current use of insulin [facility])   [facility] Birdie Sons, MD      Future Appointments            In 1 month Fisher, Kirstie Peri, MD [facility], [facility]           . FLUoxetine (PROZAC) 40 MG capsule [Pharmacy Med Name: FLUOXETINE HCL 40 MG CAPSULE] 90 capsule 0    Sig: TAKE 1 CAPSULE BY MOUTH EVERY DAY     Psychiatry:  Antidepressants - SSRI Passed - 01/01/2021  1:09 PM      Passed - Valid encounter within last 6 months    Recent  Outpatient Visits          4 months ago Type 2 diabetes mellitus without complication, without long-term current use of insulin [facility])   [facility] Birdie Sons, MD   7 months ago Type 2 diabetes mellitus without complication, without long-term current use of insulin [facility])   [facility] Birdie Sons, MD   11 months ago Type 2 diabetes mellitus without complication, without long-term current use of insulin [facility])   [facility] Birdie Sons, MD   1 year ago Acute non-recurrent frontal sinusitis   [facility] Birdie Sons, MD   1 year ago Type 2 diabetes mellitus without complication, without long-term current use of insulin [facility])   [facility] Birdie Sons, MD      Future Appointments            In 1 month Fisher, Kirstie Peri, MD [facility] [place]

## 2021-01-13 ENCOUNTER — Other Ambulatory Visit: Payer: Self-pay | Admitting: Family Medicine

## 2021-01-13 DIAGNOSIS — Z1231 Encounter for screening mammogram for malignant neoplasm of breast: Secondary | ICD-10-CM

## 2021-01-18 ENCOUNTER — Encounter: Payer: Self-pay | Admitting: Family Medicine

## 2021-01-21 ENCOUNTER — Ambulatory Visit (INDEPENDENT_AMBULATORY_CARE_PROVIDER_SITE_OTHER): Payer: Medicare HMO

## 2021-01-21 DIAGNOSIS — E119 Type 2 diabetes mellitus without complications: Secondary | ICD-10-CM

## 2021-01-21 NOTE — Chronic Care Management (AMB) (Signed)
Chronic Care Management   Follow Up Note   01/21/2021 Name: Hannah [name] MRN: [id number] DOB: Aug 27, 1950   Primary Care Provider: Birdie Sons, MD Reason for referral : Chronic Care Management   Hannah [name] is a 70 y.o. year old female who is a primary care patient of Birdie Sons, MD. She is currently enrolled in the Chronic Care Management program. A routine telephonic outreach was conducted today.  Review of Hannah [name]'s status, including review of consultants reports, relevant labs and test results was conducted today. Collaboration with appropriate care team members was performed as part of the comprehensive evaluation and provision of chronic care management services.     SDOH (Social Determinants of Health) assessments performed: No     Outpatient Encounter Medications as of 01/21/2021  Medication Sig Note   anastrozole (ARIMIDEX) 1 MG tablet TAKE 1 TABLET BY MOUTH EVERY DAY 11/02/2020: Reports completing dose.   carvedilol (COREG) 12.5 MG tablet TAKE 1 TABLET BY MOUTH TWICE A DAY    Cholecalciferol (VITAMIN D3) 1000 UNITS CAPS Take 1 capsule by mouth daily.     cyanocobalamin 1000 MCG tablet Take 1,000 mcg by mouth daily.    FLUoxetine (PROZAC) 40 MG capsule TAKE 1 CAPSULE BY MOUTH EVERY DAY    fluticasone (FLONASE) 50 MCG/ACT nasal spray SPRAY 2 SPRAYS INTO EACH NOSTRIL EVERY DAY (Patient taking differently: As needed)    glipiZIDE (GLUCOTROL XL) 5 MG 24 hr tablet TAKE 1 TABLET BY MOUTH EVERY DAY WITH BREAKFAST    losartan-hydrochlorothiazide (HYZAAR) 50-12.5 MG tablet TAKE 1 TABLET BY MOUTH EVERY DAY    metFORMIN (GLUCOPHAGE) 1000 MG tablet TAKE 1 TABLET BY MOUTH TWICE A DAY FOR DIABETES    omeprazole (PRILOSEC) 20 MG capsule TAKE 1 CAPSULE BY MOUTH EVERY DAY    simvastatin (ZOCOR) 40 MG tablet TAKE 1 TABLET BY MOUTH EVERYDAY AT BEDTIME    valsartan-hydrochlorothiazide (DIOVAN HCT) 80-12.5 MG tablet Take 1 tablet by mouth daily.    No facility-administered  encounter medications on file as of 01/21/2021.     Objective:  Patient Care Plan: Diabetes Type 2 (Adult)     Problem Identified: Disease Progression (Diabetes, Type 2)      Long-Range Goal: Disease Progression Prevented or Minimized   Start Date: 06/03/2020  Expected End Date: 09/28/2020  Priority: High   Current Barriers:  Chronic disease management support and educational needs related to Diabetes self-management   Case Manager Clinical Goal(s):  Over the next 120 days, patient will demonstrate improved adherence to prescribed treatment plan for Diabetes management as evidenced by taking medications as prescribed, monitoring and recording of CBG, and adherence to a ADA/ carb modified diet.   Interventions:  Collaboration with Birdie Sons, MD regarding development and update of comprehensive plan of care as evidenced by provider attestation and co-signature Inter-disciplinary care team collaboration (see longitudinal plan of care) Reviewed medications and compliance with treatment recommendations. Reports taking medications as prescribed. Reports not monitoring blood glucose levels d/t needed a new glucometer. Reports doing well with nutritional intake. Denies experiencing s/sx of hypoglycemia or hyperglycemia. Plans to contact her insurance provider to determine the preferred glucometer. Will update the staff to ensure the preferred device is ordered by her PCP.     Patient Goals/Self-Care Activities - Self-administer medications as prescribed - Attend all scheduled provider appointments - Monitor blood glucose levels consistently and utilize recommended interventions - Adhere to prescribed ADA/carb modified - Notify provider or care management team with questions  and new concerns as needed   Follow Up Plan:  Will follow up in two months       PLAN: A member of the care management team will follow up within the next two months.    [name]  Health[facility] Care Management [facility] [phone]

## 2021-01-26 ENCOUNTER — Other Ambulatory Visit: Payer: Self-pay | Admitting: Family Medicine

## 2021-01-26 DIAGNOSIS — K219 Gastro-esophageal reflux disease without esophagitis: Secondary | ICD-10-CM

## 2021-02-03 ENCOUNTER — Other Ambulatory Visit: Payer: Self-pay | Admitting: Family Medicine

## 2021-02-03 DIAGNOSIS — E119 Type 2 diabetes mellitus without complications: Secondary | ICD-10-CM

## 2021-02-03 NOTE — Patient Instructions (Signed)
Thank you for allowing the Chronic Care Management team to participate in your care.  It was pleasure speaking with you. Please feel free to contact me with questions.  Goals Addressed: Patient Care Plan: Diabetes Type 2 (Adult)     Problem Identified: Disease Progression (Diabetes, Type 2)      Long-Range Goal: Disease Progression Prevented or Minimized   Start Date: 06/03/2020  Expected End Date: 09/28/2020  Priority: High   Current Barriers:  Chronic disease management support and educational needs related to Diabetes self-management   Case Manager Clinical Goal(s):  Over the next 120 days, patient will demonstrate improved adherence to prescribed treatment plan for Diabetes management as evidenced by taking medications as prescribed, monitoring and recording of CBG, and adherence to a ADA/ carb modified diet.   Interventions:  Collaboration with Birdie Sons, MD regarding development and update of comprehensive plan of care as evidenced by provider attestation and co-signature Inter-disciplinary care team collaboration (see longitudinal plan of care) Reviewed medications and compliance with treatment recommendations. Reports taking medications as prescribed. Reports not monitoring blood glucose levels d/t needed a new glucometer. Reports doing well with nutritional intake. Denies experiencing s/sx of hypoglycemia or hyperglycemia. Plans to contact her insurance provider to determine the preferred glucometer. Will update the staff to ensure the preferred device is ordered by her PCP.     Patient Goals/Self-Care Activities - Self-administer medications as prescribed - Attend all scheduled provider appointments - Monitor blood glucose levels consistently and utilize recommended interventions - Adhere to prescribed ADA/carb modified - Notify provider or care management team with questions and new concerns as needed   Follow Up Plan:  Will follow up in two months         Hannah [name] verbalized understanding of the information discussed during the telephonic outreach today. Declined need for mailed/printed instructions. A member of the care management team will follow up within the next two months.    Hannah [name] [facility] Care Management [facility] [phone]

## 2021-02-04 ENCOUNTER — Telehealth: Payer: Self-pay

## 2021-02-04 DIAGNOSIS — D508 Other iron deficiency anemias: Secondary | ICD-10-CM

## 2021-02-04 DIAGNOSIS — E119 Type 2 diabetes mellitus without complications: Secondary | ICD-10-CM

## 2021-02-04 DIAGNOSIS — I10 Essential (primary) hypertension: Secondary | ICD-10-CM

## 2021-02-04 DIAGNOSIS — E785 Hyperlipidemia, unspecified: Secondary | ICD-10-CM

## 2021-02-04 NOTE — Telephone Encounter (Signed)
Copied from [place] [phone]. Topic: General - Other >> Feb 04, 2021  2:08 PM Celene Kras wrote: Reason for CRM: Pt called and is requesting to have PCP check her insurance to see if she is able to get a meter to check her sugar levels. She states that she is also needing to have her hemoglobin checked as well due to her levels have been low.  Please advise.

## 2021-02-04 NOTE — Telephone Encounter (Signed)
I called and advised patient that she needs to contact her insurance to find out which glucose meter they cover. Patient verbalized understanding.   Patient has a CPE scheduled for 03/01/2021 at 3pm. She would like to have labs done before her appointment. Patient is requesting that Dr. Caryn Section place orders for her to have labs done. She also wants him to check her hemoglobin levels. She says her cancer doctor told her that her hemoglobin levels were low a few months ago.

## 2021-02-04 NOTE — Telephone Encounter (Signed)
Forms being sent back for completion

## 2021-02-07 ENCOUNTER — Other Ambulatory Visit: Payer: Self-pay | Admitting: Family Medicine

## 2021-02-07 DIAGNOSIS — E119 Type 2 diabetes mellitus without complications: Secondary | ICD-10-CM

## 2021-02-07 DIAGNOSIS — I10 Essential (primary) hypertension: Secondary | ICD-10-CM

## 2021-02-08 NOTE — Telephone Encounter (Signed)
Please print and leave order for her at lab. Needs to be fasting.

## 2021-02-09 NOTE — Telephone Encounter (Signed)
Pt advised.  She is going to come by next week to get her labs done.   Thanks,   -Mickel Baas

## 2021-02-10 ENCOUNTER — Ambulatory Visit
Admission: RE | Admit: 2021-02-10 | Discharge: 2021-02-10 | Disposition: A | Discharge status: Home / Self Care | Payer: Medicare HMO | Source: Ambulatory Visit | Attending: Family Medicine | Admitting: Family Medicine

## 2021-02-10 ENCOUNTER — Other Ambulatory Visit: Payer: Self-pay

## 2021-02-10 DIAGNOSIS — Z1231 Encounter for screening mammogram for malignant neoplasm of breast: Secondary | ICD-10-CM | POA: Insufficient documentation

## 2021-02-11 ENCOUNTER — Other Ambulatory Visit: Payer: Self-pay | Admitting: Family Medicine

## 2021-02-11 DIAGNOSIS — E119 Type 2 diabetes mellitus without complications: Secondary | ICD-10-CM

## 2021-02-15 DIAGNOSIS — E119 Type 2 diabetes mellitus without complications: Secondary | ICD-10-CM | POA: Diagnosis not present

## 2021-02-15 DIAGNOSIS — E785 Hyperlipidemia, unspecified: Secondary | ICD-10-CM | POA: Diagnosis not present

## 2021-02-15 DIAGNOSIS — D508 Other iron deficiency anemias: Secondary | ICD-10-CM | POA: Diagnosis not present

## 2021-02-16 ENCOUNTER — Encounter: Payer: Self-pay | Admitting: Family Medicine

## 2021-02-17 LAB — CBC
Hematocrit: 36.3 % (ref 34.0–46.6)
Hemoglobin: 12.1 g/dL (ref 11.1–15.9)
MCH: 29.2 pg (ref 26.6–33.0)
MCHC: 33.3 g/dL (ref 31.5–35.7)
MCV: 88 fL (ref 79–97)
Platelets: 444 10*3/uL (ref 150–450)
RBC: 4.14 x10E6/uL (ref 3.77–5.28)
RDW: 13.3 % (ref 11.7–15.4)
WBC: 6.6 10*3/uL (ref 3.4–10.8)

## 2021-02-17 LAB — IRON,TIBC AND FERRITIN PANEL
Ferritin: 18 ng/mL (ref 15–150)
Iron Saturation: 23 % (ref 15–55)
Iron: 99 ug/dL (ref 27–139)
Total Iron Binding Capacity: 432 ug/dL (ref 250–450)
UIBC: 333 ug/dL (ref 118–369)

## 2021-02-17 LAB — LIPID PANEL
Chol/HDL Ratio: 2.1 ratio (ref 0.0–4.4)
Cholesterol, Total: 161 mg/dL (ref 100–199)
HDL: 76 mg/dL (ref >39)
LDL Chol Calc (NIH): 67 mg/dL (ref 0–99)
Triglycerides: 101 mg/dL (ref 0–149)
VLDL Cholesterol Cal: 18 mg/dL (ref 5–40)

## 2021-02-17 LAB — COMPREHENSIVE METABOLIC PANEL
ALT: 14 IU/L (ref 0–32)
AST: 14 IU/L (ref 0–40)
Albumin/Globulin Ratio: 2 (ref 1.2–2.2)
Albumin: 4.3 g/dL (ref 3.8–4.8)
Alkaline Phosphatase: 62 IU/L (ref 44–121)
BUN/Creatinine Ratio: 13 (ref 12–28)
BUN: 11 mg/dL (ref 8–27)
Bilirubin Total: 0.4 mg/dL (ref 0.0–1.2)
CO2: 18 mmol/L — ABNORMAL LOW (ref 20–29)
Calcium: 9.6 mg/dL (ref 8.7–10.3)
Chloride: 96 mmol/L (ref 96–106)
Creatinine, Ser: 0.82 mg/dL (ref 0.57–1.00)
Globulin, Total: 2.2 g/dL (ref 1.5–4.5)
Glucose: 165 mg/dL — ABNORMAL HIGH (ref 65–99)
Potassium: 4.8 mmol/L (ref 3.5–5.2)
Sodium: 138 mmol/L (ref 134–144)
Total Protein: 6.5 g/dL (ref 6.0–8.5)
eGFR: 77 mL/min/{1.73_m2} (ref >59)

## 2021-02-17 LAB — HEMOGLOBIN A1C
Est. average glucose Bld gHb Est-mCnc: 166 mg/dL
Hgb A1c MFr Bld: 7.4 % — ABNORMAL HIGH (ref 4.8–5.6)

## 2021-03-01 ENCOUNTER — Encounter: Payer: Self-pay | Admitting: Family Medicine

## 2021-03-01 ENCOUNTER — Ambulatory Visit (INDEPENDENT_AMBULATORY_CARE_PROVIDER_SITE_OTHER): Payer: Medicare HMO | Admitting: Family Medicine

## 2021-03-01 ENCOUNTER — Other Ambulatory Visit: Payer: Self-pay

## 2021-03-01 VITALS — BP 97/65 | HR 75 | Temp 97.4°F | Resp 16 | Ht 60.0 in | Wt 149.0 lb

## 2021-03-01 DIAGNOSIS — I10 Essential (primary) hypertension: Secondary | ICD-10-CM

## 2021-03-01 DIAGNOSIS — Z0000 Encounter for general adult medical examination without abnormal findings: Secondary | ICD-10-CM | POA: Diagnosis not present

## 2021-03-01 DIAGNOSIS — Z289 Immunization not carried out for unspecified reason: Secondary | ICD-10-CM | POA: Diagnosis not present

## 2021-03-01 DIAGNOSIS — E785 Hyperlipidemia, unspecified: Secondary | ICD-10-CM | POA: Diagnosis not present

## 2021-03-01 DIAGNOSIS — J329 Chronic sinusitis, unspecified: Secondary | ICD-10-CM | POA: Diagnosis not present

## 2021-03-01 DIAGNOSIS — E119 Type 2 diabetes mellitus without complications: Secondary | ICD-10-CM | POA: Diagnosis not present

## 2021-03-01 MED ORDER — SHINGRIX 50 MCG/0.5ML IM SUSR: 0.5000 mL | Freq: Once | INTRAMUSCULAR | 0 refills | Status: AC

## 2021-03-01 NOTE — Progress Notes (Signed)
Annual Wellness Visit     Patient: Hannah [name], Female    DOB: 1951/07/05, 70 y.o.   MRN: [id number] Visit Date: 03/01/2021  Today's Provider: Lelon Huh, MD    Subjective    Hannah [name] is a 70 y.o. female who presents today for her Annual Wellness Visit.     Medications: Outpatient Medications Prior to Visit  Medication Sig   anastrozole (ARIMIDEX) 1 MG tablet TAKE 1 TABLET BY MOUTH EVERY DAY   carvedilol (COREG) 12.5 MG tablet TAKE 1 TABLET BY MOUTH TWICE A DAY   Cholecalciferol (VITAMIN D3) 1000 UNITS CAPS Take 1 capsule by mouth daily.    cyanocobalamin 1000 MCG tablet Take 1,000 mcg by mouth daily.   FLUoxetine (PROZAC) 40 MG capsule TAKE 1 CAPSULE BY MOUTH EVERY DAY   fluticasone (FLONASE) 50 MCG/ACT nasal spray SPRAY 2 SPRAYS INTO EACH NOSTRIL EVERY DAY (Patient taking differently: As needed)   glipiZIDE (GLUCOTROL XL) 5 MG 24 hr tablet TAKE 1 TABLET BY MOUTH EVERY DAY WITH BREAKFAST   metFORMIN (GLUCOPHAGE) 1000 MG tablet TAKE 1 TABLET BY MOUTH TWICE A DAY FOR DIABETES   omeprazole (PRILOSEC) 20 MG capsule TAKE 1 CAPSULE BY MOUTH EVERY DAY   simvastatin (ZOCOR) 40 MG tablet TAKE 1 TABLET BY MOUTH EVERYDAY AT BEDTIME   valsartan-hydrochlorothiazide (DIOVAN-HCT) 80-12.5 MG tablet TAKE 1 TABLET BY MOUTH EVERY DAY   No facility-administered medications prior to visit.    Allergies  Allergen Reactions   Azithromycin Swelling    Itching, swelling, and rash   Codeine Other (See Comments)    Mental changes   Penicillins Itching and Rash    Patient Care Team: Birdie Sons, MD as PCP - General (Family Medicine) Cammie Sickle, MD as Consulting Physician (Internal Medicine) Pa, [facility] (Optometry) Bary Castilla, Forest Gleason, MD as Consulting Physician (General Surgery) Neldon Labella, RN as Case Manager  Review of Systems      Objective     Most recent functional status assessment: In your present state of health, do you have any  difficulty performing the following activities: 03/01/2021  Hearing? Y  Vision? N  Difficulty concentrating or making decisions? N  Walking or climbing stairs? N  Dressing or bathing? N  Doing errands, shopping? N  Preparing Food and eating ? -  Using the Toilet? -  In the past six months, have you accidently leaked urine? -  Comment -  Do you have problems with loss of bowel control? -  Managing your Medications? -  Managing your Finances? -  Housekeeping or managing your Housekeeping? -  Some recent data might be hidden   Most recent fall risk assessment: Fall Risk  03/01/2021  Falls in the past year? 0  Number falls in past yr: 0  Comment -  Injury with Fall? 0  Comment -  Risk for fall due to : No Fall Risks  Follow up Falls evaluation completed    Most recent depression screenings: PHQ 2/9 Scores 03/01/2021 09/21/2020  PHQ - 2 Score 2 1  PHQ- 9 Score 5 -   Most recent cognitive screening: 6CIT Screen 03/24/2020  What Year? 0 points  What month? 0 points  What time? 0 points  Count back from 20 0 points  Months in reverse 2 points  Repeat phrase 2 points  Total Score 4   Most recent Audit-C alcohol use screening Alcohol Use Disorder Test (AUDIT) 03/01/2021  1. How often do you have  a drink containing alcohol? 0  2. How many drinks containing alcohol do you have on a typical day when you are drinking? 0  3. How often do you have six or more drinks on one occasion? 0  AUDIT-C Score 0  Alcohol Brief Interventions/Follow-up -   A score of 3 or more in women, and 4 or more in men indicates increased risk for alcohol abuse, EXCEPT if all of the points are from question 1   No results found for any visits on 03/01/21.  Assessment & Plan     Annual wellness visit done today including the all of the following: Reviewed patient's Family Medical History Reviewed and updated list of patient's medical providers Assessment of cognitive impairment was done Assessed patient's  functional ability Established a written schedule for health screening [place] Completed and Reviewed  Exercise Activities and Dietary recommendations  Goals      Chronic Disease Management     CARE PLAN ENTRY (see longitudinal plan of care for additional care plan information)  Current Barriers:  Chronic Disease Management support and education needs related to HTN, HLD, DM and GERD.  Nurse Case Manager Clinical Goal(s):  Over the next 120 days, patient: Will not require hospitalization or emergent care d/t complications r/t chronic illnesses. Over the next 90 days, patient: Will attend all scheduled medical appointments. Will take all medications as scheduled. Will monitor BP and record readings. Will adhere to recommended cardiac prudent/diabetic diet. Will follow recommended safety precautions to prevent falls and injuries.  Over the next 30 days, patient: Follow up with the surgical team as scheduled regarding colonoscopy.   Interventions:  Inter-disciplinary care team collaboration (see longitudinal plan of care)   Reviewed compliance with plan for diabetes management. Reports  attempting to adhere to recommended plan. She continues attempting to manage with medications and diet. Prefers not to monitor blood glucose levels daily. We discussed current nutrition along with heart healthy/diabetic friendly food options. Agreed to update the team if additional educational resources are needed.   Provided information regarding safety and fall prevention measures. Encourage to use caution when ambulating. Advised to keep pathways clear and well lit. Reports ambulating well. Attempts to light walks daily. Denies recent falls.   Reviewed scheduled/pending appointments. Encouraged to attend medical appointments as scheduled to prevent delays in care. Encouraged to notify care management team with concerns regarding transportation. Pending outreach with the  surgical team on 06/08/20 to discuss plan/schedule a colonoscopy. Denies concerns regarding transportation.   Discussed plans for ongoing care management follow up. Provided direct contact information for CCM Nurse Case Manager.   Patient Self Care Activities:  Self administers medications Attends scheduled provider appointments Calls pharmacy for medication refills Attends church or other social activities Performs ADL's independently Performs IADL's independently Calls provider office for new concerns or questions  Initial goal documentation      Exercise 3x per week (30 min per time)     Recommend to start exercising 3 days a week for 30 minutes.         Immunization History  Administered Date(s) Administered   Fluad Quad(high Dose 65+) 05/17/2020   Influenza, High Dose Seasonal PF 06/11/2017, 06/04/2018   Influenza-Unspecified 05/22/2019   PFIZER(Purple Top)SARS-COV-2 Vaccination 10/01/2019, 10/22/2019   Pneumococcal Conjugate-13 02/07/2016   Pneumococcal Polysaccharide-23 06/11/2017   Tdap 12/04/2011   Zoster, Live 09/11/2013    Health Maintenance  Topic Date Due   FOOT EXAM  Never done   Zoster Vaccines-  Shingrix (1 of 2) Never done   COVID-19 Vaccine (3 - Pfizer risk series) 11/19/2019   OPHTHALMOLOGY EXAM  08/03/2020   INFLUENZA VACCINE  02/28/2021   HEMOGLOBIN A1C  08/18/2021   TETANUS/TDAP  12/03/2021   MAMMOGRAM  02/11/2023   Fecal DNA (Cologuard)  07/10/2023   DEXA SCAN  Completed   Hepatitis C Screening  Completed   PNA vac Low Risk Adult  Completed   HPV VACCINES  Aged Out     Discussed health benefits of physical activity, and encouraged her to engage in regular exercise appropriate for her age and condition.           Lelon Huh, MD  [facility] [phone] (phone) [phone] (fax)  [place]

## 2021-03-01 NOTE — Progress Notes (Signed)
I,[name],acting as a scribe for Lelon Huh, MD.,have documented all relevant documentation on the behalf of Lelon Huh, MD,as directed by  Lelon Huh, MD while in the presence of Lelon Huh, MD.   Complete physical exam   Patient: Hannah [name]   DOB: Mar 23, 1951   70 y.o. Female  MRN: [id number] Visit Date: 03/01/2021  Today's healthcare provider: Lelon Huh, MD   Chief Complaint  Patient presents with   Annual Exam   Subjective    Hannah [name] is a 70 y.o. female who presents today for a complete physical exam.  She reports consuming a general diet. The patient does not participate in regular exercise at present. She generally feels fairly well. She reports sleeping fairly well. She does not have additional problems to discuss today.  HPI   Past Medical History:  Diagnosis Date   Allergy    Arthritis    Cancer ([place]) 2011-Right    DCIS. 9 mm histologic grade 2 invasive mammary carcinoma, ER 90%, PR 5%, HER-2/neu not over expressed. Sentinel nodes negative.   Chicken pox    Diabetes mellitus without complication ([place])    Fractured elbow    History of lumpectomy 2011   Right breast   Hyperlipidemia    Hypertension 2001   Personal history of chemotherapy 2011   Right Breast   Personal history of radiation therapy 2011   Right breast   Radial head fracture, closed 12/19/2016   Past Surgical History:  Procedure Laterality Date   BREAST BIOPSY Right 2011   Invasive Ductal Carcinoma   BREAST LUMPECTOMY Right 2011   BREAST SURGERY Right 2011    right breast wide excision, partial breast radiation.   COLONOSCOPY  Declines   Reports she has annual stool fecal occult blood testing.   COLONOSCOPY WITH PROPOFOL N/A 11/24/2020   Procedure: COLONOSCOPY WITH PROPOFOL;  Surgeon: Robert Bellow, MD;  Location[facility] ENDOSCOPY;  Service: Endoscopy;  Laterality: N/A;   DILATION AND CURETTAGE OF UTERUS  1990   ESOPHAGOGASTRODUODENOSCOPY (EGD) WITH  PROPOFOL N/A 10/20/2020   Procedure: ESOPHAGOGASTRODUODENOSCOPY (EGD) WITH PROPOFOL;  Surgeon: Robert Bellow, MD;  Location: [facility] ENDOSCOPY;  Service: Endoscopy;  Laterality: N/A;   FRACTURE SURGERY     PORT-A-CATH REMOVAL  2012   right breast wide excision  2011   WRIST SURGERY  1998   pins placed   Social History   Socioeconomic History   Marital status: Widowed    Spouse name: Not on file   Number of children: 1   Years of education: Not on file   Highest education level: Bachelor's degree (e.g., BA, AB, BS)  Occupational History   Occupation: retired  Tobacco Use   Smoking status: Never   Smokeless tobacco: Never  Vaping Use   Vaping Use: Never used  Substance and Sexual Activity   Alcohol use: No   Drug use: No   Sexual activity: Not on file  Other Topics Concern   Not on file  Social History Narrative   Not on file   Social Determinants of Health   Financial Resource Strain: Low Risk    Difficulty of Paying Living Expenses: Not hard at all  Food Insecurity: No Food Insecurity   Worried About Charity fundraiser in the Last Year: Never true   [place] in the Last Year: Never true  Transportation Needs: No Transportation Needs   Lack of Transportation (Medical): No   Lack of Transportation (Non-Medical):  No  Physical Activity: Inactive   Days of Exercise per Week: 0 days   Minutes of Exercise per Session: 0 min  Stress: No Stress Concern Present   Feeling of Stress : Only a little  Social Connections: Moderately Isolated   Frequency of Communication with Friends and Family: More than three times a week   Frequency of Social Gatherings with Friends and Family: More than three times a week   Attends Religious Services: More than 4 times per year   Active Member of [employer] or Organizations: No   Attends Archivist Meetings: Never   Marital Status: Widowed  Human resources officer Violence: Not At Risk   Fear of Current or Ex-Partner: No    Emotionally Abused: No   Physically Abused: No   Sexually Abused: No   Family Status  Relation Name Status   Mother  Alive   Father  (Not Specified)   MGM  (Not Specified)   MGF  (Not Specified)   Family History  Problem Relation Age of Onset   Arthritis Mother    Alzheimer's disease Mother    Arthritis Father    Heart disease Father    Arthritis Maternal Grandmother    Breast cancer Maternal Grandmother    Alzheimer's disease Maternal Grandmother    Stroke Maternal Grandfather    Allergies  Allergen Reactions   Azithromycin Swelling    Itching, swelling, and rash   Codeine Other (See Comments)    Mental changes   Penicillins Itching and Rash    Patient Care Team: Birdie Sons, MD as PCP - General (Family Medicine) Cammie Sickle, MD as Consulting Physician (Internal Medicine) Pa, [place] (Optometry) Byrnett, Forest Gleason, MD as Consulting Physician (General Surgery) Neldon Labella, RN as Case Manager   Medications: Outpatient Medications Prior to Visit  Medication Sig   anastrozole (ARIMIDEX) 1 MG tablet TAKE 1 TABLET BY MOUTH EVERY DAY   carvedilol (COREG) 12.5 MG tablet TAKE 1 TABLET BY MOUTH TWICE A DAY   Cholecalciferol (VITAMIN D3) 1000 UNITS CAPS Take 1 capsule by mouth daily.    cyanocobalamin 1000 MCG tablet Take 1,000 mcg by mouth daily.   FLUoxetine (PROZAC) 40 MG capsule TAKE 1 CAPSULE BY MOUTH EVERY DAY   fluticasone (FLONASE) 50 MCG/ACT nasal spray SPRAY 2 SPRAYS INTO EACH NOSTRIL EVERY DAY (Patient taking differently: As needed)   glipiZIDE (GLUCOTROL XL) 5 MG 24 hr tablet TAKE 1 TABLET BY MOUTH EVERY DAY WITH BREAKFAST   metFORMIN (GLUCOPHAGE) 1000 MG tablet TAKE 1 TABLET BY MOUTH TWICE A DAY FOR DIABETES   omeprazole (PRILOSEC) 20 MG capsule TAKE 1 CAPSULE BY MOUTH EVERY DAY   simvastatin (ZOCOR) 40 MG tablet TAKE 1 TABLET BY MOUTH EVERYDAY AT BEDTIME   valsartan-hydrochlorothiazide (DIOVAN-HCT) 80-12.5 MG tablet TAKE 1 TABLET BY  MOUTH EVERY DAY   No facility-administered medications prior to visit.    Review of Systems  HENT:  Positive for ear pain and sinus pressure.   Neurological:  Positive for headaches.  All other systems reviewed and are negative.    Objective    BP 97/65 (BP Location: Left Arm, Patient Position: Sitting, Cuff Size: Normal)   Pulse 75   Temp (!) 97.4 F (36.3 C) (Oral)   Resp 16   Ht 5' (1.524 m)   Wt 149 lb (67.6 kg)   LMP 08/30/2003   SpO2 93%   BMI 29.10 kg/m    Physical Exam    General Appearance:  Well developed, well nourished female. Alert, cooperative, in no acute distress, appears stated age   Head:    Normocephalic, without obvious abnormality, atraumatic  Eyes:    PERRL, conjunctiva/corneas clear, EOM's intact, fundi    benign, both eyes  ENT:   Slight tenderness over frontal sinuses.   Ears:    Normal TM's and external ear canals, both ears  Neck:   Supple, symmetrical, trachea midline, no adenopathy;    thyroid:  no enlargement/tenderness/nodules; no carotid   bruit or JVD  Back:     Symmetric, no curvature, ROM normal, no CVA tenderness  Lungs:     Clear to auscultation bilaterally, respirations unlabored  Chest Wall:    No tenderness or deformity   Heart:    Normal heart rate. Normal rhythm. No murmurs, rubs, or gallops.   Breast Exam:    deferred  Abdomen:     Soft, non-tender, bowel sounds active all four quadrants,    no masses, no organomegaly  Pelvic:    deferred  Extremities:   All extremities are intact. No cyanosis or edema  Pulses:   2+ and symmetric all extremities  Skin:   Skin color, texture, turgor normal, no rashes or lesions  Lymph nodes:   Cervical, supraclavicular, and axillary nodes normal  Neurologic:   CNII-XII intact, normal strength, sensation and reflexes    throughout     Assessment & Plan     1. Annual physical exam  - EKG 12-Lead  2. Prescription for Shingrix. Vaccine not administered in office.   - Zoster  Vaccine Adjuvanted [facility]) injection; Inject 0.5 mLs into the muscle once for 1 dose.  Dispense: 0.5 mL; Refill: 0  3. Primary hypertension Well controlled.  Continue current medications.   - EKG 12-Lead  4. Type 2 diabetes mellitus without complication, without long-term current use of insulin ([place]) Relative well controlled, reviewed recent labs. Continue current medications.   - EKG 12-Lead  Follow up dm in about 4 months.   5. Hyperlipidemia, unspecified hyperlipidemia type She is tolerating simvastatin well with no adverse effects.    6. Sinusitis, unspecified chronicity, unspecified location Very mild and early in course. If not resolving in 5-7 cans she can call for prescription antibiotic.       The entirety of the information documented in the History of Present Illness, Review of Systems and Physical Exam were personally obtained by me. Portions of this information were initially documented by the CMA and reviewed by me for thoroughness and accuracy.     Lelon Huh, MD  [facility] [phone] (phone) [phone] (fax)  [place]

## 2021-03-01 NOTE — Patient Instructions (Signed)
.   Please review the attached list of medications and notify my office if there are any errors.   . Please bring all of your medications to every appointment so we can make sure that our medication list is the same as yours.   

## 2021-03-11 ENCOUNTER — Ambulatory Visit (INDEPENDENT_AMBULATORY_CARE_PROVIDER_SITE_OTHER): Payer: Medicare HMO

## 2021-03-11 DIAGNOSIS — E119 Type 2 diabetes mellitus without complications: Secondary | ICD-10-CM

## 2021-03-11 NOTE — Chronic Care Management (AMB) (Signed)
  Chronic Care Management   CCM RN Visit Note  03/11/2021 Name: Hannah [name] MRN: [id number] DOB: 09/07/1950   Subjective: Hannah [name] is a 70 y.o. year old female who is a primary care patient of Fisher, Kirstie Peri, MD. The care management team was consulted for assistance with disease management and care coordination needs.    Outpatient Encounter Medications as of 03/11/2021  Medication Sig Note   anastrozole (ARIMIDEX) 1 MG tablet TAKE 1 TABLET BY MOUTH EVERY DAY 11/02/2020: Reports completing dose.   carvedilol (COREG) 12.5 MG tablet TAKE 1 TABLET BY MOUTH TWICE A DAY    Cholecalciferol (VITAMIN D3) 1000 UNITS CAPS Take 1 capsule by mouth daily.     cyanocobalamin 1000 MCG tablet Take 1,000 mcg by mouth daily.    FLUoxetine (PROZAC) 40 MG capsule TAKE 1 CAPSULE BY MOUTH EVERY DAY    fluticasone (FLONASE) 50 MCG/ACT nasal spray SPRAY 2 SPRAYS INTO EACH NOSTRIL EVERY DAY (Patient taking differently: As needed)    glipiZIDE (GLUCOTROL XL) 5 MG 24 hr tablet TAKE 1 TABLET BY MOUTH EVERY DAY WITH BREAKFAST    metFORMIN (GLUCOPHAGE) 1000 MG tablet TAKE 1 TABLET BY MOUTH TWICE A DAY FOR DIABETES    omeprazole (PRILOSEC) 20 MG capsule TAKE 1 CAPSULE BY MOUTH EVERY DAY    simvastatin (ZOCOR) 40 MG tablet TAKE 1 TABLET BY MOUTH EVERYDAY AT BEDTIME    valsartan-hydrochlorothiazide (DIOVAN-HCT) 80-12.5 MG tablet TAKE 1 TABLET BY MOUTH EVERY DAY    No facility-administered encounter medications on file as of 03/11/2021.     Hannah [name] was scheduled for a routine follow-up. Reports doing well but unable to complete the outreach today. Agreeable to completing a telephonic outreach next week.  Agreed to call with urgent needs if needed.   [name] Care Management [facility] [phone]

## 2021-03-15 ENCOUNTER — Other Ambulatory Visit: Payer: Self-pay | Admitting: Family Medicine

## 2021-03-15 DIAGNOSIS — C50911 Malignant neoplasm of unspecified site of right female breast: Secondary | ICD-10-CM

## 2021-03-15 DIAGNOSIS — E119 Type 2 diabetes mellitus without complications: Secondary | ICD-10-CM

## 2021-03-15 DIAGNOSIS — I10 Essential (primary) hypertension: Secondary | ICD-10-CM

## 2021-03-17 DIAGNOSIS — H52223 Regular astigmatism, bilateral: Secondary | ICD-10-CM | POA: Diagnosis not present

## 2021-03-17 DIAGNOSIS — E119 Type 2 diabetes mellitus without complications: Secondary | ICD-10-CM | POA: Diagnosis not present

## 2021-03-17 DIAGNOSIS — H2513 Age-related nuclear cataract, bilateral: Secondary | ICD-10-CM | POA: Diagnosis not present

## 2021-03-17 DIAGNOSIS — H25043 Posterior subcapsular polar age-related cataract, bilateral: Secondary | ICD-10-CM | POA: Diagnosis not present

## 2021-03-17 DIAGNOSIS — H0288A Meibomian gland dysfunction right eye, upper and lower eyelids: Secondary | ICD-10-CM | POA: Diagnosis not present

## 2021-03-17 DIAGNOSIS — H524 Presbyopia: Secondary | ICD-10-CM | POA: Diagnosis not present

## 2021-03-17 DIAGNOSIS — H0288B Meibomian gland dysfunction left eye, upper and lower eyelids: Secondary | ICD-10-CM | POA: Diagnosis not present

## 2021-03-17 LAB — HM DIABETES EYE EXAM

## 2021-03-24 ENCOUNTER — Telehealth: Payer: Self-pay

## 2021-03-24 NOTE — Telephone Encounter (Signed)
Patient wanted to let you know that she now has the One Touch Verio glucometer

## 2021-03-25 ENCOUNTER — Telehealth: Payer: Self-pay

## 2021-03-25 NOTE — Telephone Encounter (Signed)
  Care Management   Follow Up Note   03/25/2021 Name: Hannah [name] MRN: [id number] DOB: 1951-02-17   Primary Care Provider: Birdie Sons, MD Reason for referral : Chronic Care Management   An unsuccessful telephone outreach was attempted today. The patient was referred to the case management team for assistance with care management and care coordination.     Follow Up Plan:  A member of the care management team will attempt to reach [name] within the next two weeks.    [name] Care Management [facility] [phone]

## 2021-03-29 ENCOUNTER — Encounter: Payer: Self-pay | Admitting: Family Medicine

## 2021-04-06 ENCOUNTER — Telehealth: Payer: Self-pay

## 2021-04-06 NOTE — Telephone Encounter (Signed)
  Care Management   Follow Up Note   04/06/2021 Name: Hannah [name] MRN: [id number] DOB: [birth year]   Primary Care Provider: Birdie Sons, MD Reason for referral : Chronic Care Management    An unsuccessful telephone outreach was attempted today. The patient was referred to the case management team for assistance with care management and care coordination.    Follow Up Plan:  A HIPAA compliant voice message was left today requesting a return call.    [name] Care Management [facility] [phone]

## 2021-04-12 ENCOUNTER — Other Ambulatory Visit: Payer: Self-pay | Admitting: Family Medicine

## 2021-04-12 DIAGNOSIS — E119 Type 2 diabetes mellitus without complications: Secondary | ICD-10-CM

## 2021-04-15 ENCOUNTER — Ambulatory Visit (INDEPENDENT_AMBULATORY_CARE_PROVIDER_SITE_OTHER): Payer: Medicare HMO

## 2021-04-15 DIAGNOSIS — I10 Essential (primary) hypertension: Secondary | ICD-10-CM

## 2021-04-15 DIAGNOSIS — E119 Type 2 diabetes mellitus without complications: Secondary | ICD-10-CM

## 2021-04-15 DIAGNOSIS — E785 Hyperlipidemia, unspecified: Secondary | ICD-10-CM

## 2021-04-15 DIAGNOSIS — E611 Iron deficiency: Secondary | ICD-10-CM

## 2021-04-15 NOTE — Chronic Care Management (AMB) (Signed)
Chronic Care Management   CCM RN Visit Note  04/15/2021 Name: Hannah [name] MRN: [id number] DOB: 10-14-1950  Subjective: Hannah [name] is a 70 y.o. year old female who is a primary care patient of Fisher, Kirstie Peri, MD. The care management team was consulted for assistance with disease management and care coordination needs.    Engaged with patient by telephone for follow up visit in response to provider referral for case management and care coordination services.   Consent to Services:  The patient was given information about Chronic Care Management services, agreed to services, and gave verbal consent prior to initiation of services.  Please see initial visit note for detailed documentation.    Assessment: Review of patient past medical history, allergies, medications, health status, including review of consultants reports, laboratory and other test data, was performed as part of comprehensive evaluation and provision of chronic care management services.   SDOH (Social Determinants of Health) assessments and interventions performed: No  CCM Care Plan  Allergies  Allergen Reactions   Azithromycin Swelling    Itching, swelling, and rash   Codeine Other (See Comments)    Mental changes   Penicillins Itching and Rash    Outpatient Encounter Medications as of 04/15/2021  Medication Sig Note   anastrozole (ARIMIDEX) 1 MG tablet TAKE 1 TABLET BY MOUTH EVERY DAY 11/02/2020: Reports completing dose.   carvedilol (COREG) 12.5 MG tablet TAKE 1 TABLET BY MOUTH TWICE A DAY    Cholecalciferol (VITAMIN D3) 1000 UNITS CAPS Take 1 capsule by mouth daily.     cyanocobalamin 1000 MCG tablet Take 1,000 mcg by mouth daily.    FLUoxetine (PROZAC) 40 MG capsule TAKE 1 CAPSULE BY MOUTH EVERY DAY    fluticasone (FLONASE) 50 MCG/ACT nasal spray SPRAY 2 SPRAYS INTO EACH NOSTRIL EVERY DAY (Patient taking differently: As needed)    glipiZIDE (GLUCOTROL XL) 5 MG 24 hr tablet TAKE 1 TABLET BY MOUTH EVERY  DAY WITH BREAKFAST    metFORMIN (GLUCOPHAGE) 1000 MG tablet TAKE 1 TABLET BY MOUTH TWICE A DAY FOR DIABETES    omeprazole (PRILOSEC) 20 MG capsule TAKE 1 CAPSULE BY MOUTH EVERY DAY    simvastatin (ZOCOR) 40 MG tablet TAKE 1 TABLET BY MOUTH EVERYDAY AT BEDTIME    valsartan-hydrochlorothiazide (DIOVAN-HCT) 80-12.5 MG tablet TAKE 1 TABLET BY MOUTH EVERY DAY    No facility-administered encounter medications on file as of 04/15/2021.    Patient Active Problem List   Diagnosis Date Noted   Gastroesophageal reflux disease 07/15/2019   Malignant neoplasm of overlapping sites of right breast ([place]) 03/02/2016   Hyperlipidemia 02/08/2016   DM type 2 (diabetes mellitus, type 2) ([place]) 02/08/2016   History of breast cancer 04/29/2013   Hypertension     Conditions to be addressed/monitored: DMII Patient Care Plan: Diabetes Type 2 (Adult)  Completed 04/15/21   Problem Identified: Disease Progression (Diabetes, Type 2) Resolved 04/15/2021     Long-Range Goal: Disease Progression Prevented or Minimized Completed 04/15/2021  Start Date: 06/03/2020  Expected End Date: 09/28/2020  Priority: High   Current Barriers:  Chronic disease management support and educational needs related to Diabetes self-management   Case Manager Clinical Goal(s):  Over the next 120 days, patient will demonstrate improved adherence to prescribed treatment plan for Diabetes management as evidenced by taking medications as prescribed, monitoring and recording of CBG, and adherence to a ADA/ carb modified diet.   Interventions:  Collaboration with Birdie Sons, MD regarding development and update of comprehensive  plan of care as evidenced by provider attestation and co-signature Inter-disciplinary care team collaboration (see longitudinal plan of care) Discussed compliance with treatment recommendations. Reports taking medications as prescribed and doing well with diabetes self-management. Declined need for additional  diabetic resources.     Patient Goals/Self-Care Activities Self-administer medications as prescribed Attend all scheduled provider appointments Monitor blood glucose levels consistently and utilize recommended interventions Adhere to prescribed ADA/carb modified Notify provider or care management team with questions and new concerns as needed   Goal Met     Patient Care Plan: Iron Deficiency (Adult)  Completed 04/15/2021   Problem Identified: Iron Deficiency Resolved 04/15/2021     Long-Range Goal: Anemia Prevented or Managed Completed 04/15/2021  Start Date: 09/21/2020  Expected End Date: 12/20/2020  Priority: High  Note:    Current Barriers:  Chronic Disease Management support and education needs related to Iron Deficiency  Case Manager Clinical Goal(s):  Over the next 90 days, patient will not require hospitalization or experience complications d/t iron deficiency.  Interventions:  Collaboration with Birdie Sons, MD regarding development and update of comprehensive plan of care as evidenced by provider attestation and co-signature Inter-disciplinary care team collaboration (see longitudinal plan of care) Patient reports taking medications as prescribed and following recommended treatment plan. Declined need for additional resources.   Patient Goals/Self-Care Activities: Continue taking medications and oral supplements as recommended. Maintain adequate hydration and adhere to recommended diet. Complete required labs and attend scheduled provider follow-up appointments Contact provider or care management team with questions or new concerns   Goal Met      PLAN Hannah [name] will contact the clinic if additional outreach is required. The care management team will gladly assist.   [name] [facility] [phone]

## 2021-04-15 NOTE — Patient Instructions (Addendum)
Thank you for allowing the Chronic Care Management team to participate in your care.   Goals Met: Patient Care Plan: Diabetes Type 2 (Adult)  Completed 04/15/21   Problem Identified: Disease Progression (Diabetes, Type 2) Resolved 04/15/2021     Long-Range Goal: Disease Progression Prevented or Minimized Completed 04/15/2021  Start Date: 06/03/2020  Expected End Date: 09/28/2020  Priority: High   Current Barriers:  Chronic disease management support and educational needs related to Diabetes self-management   Case Manager Clinical Goal(s):  Over the next 120 days, patient will demonstrate improved adherence to prescribed treatment plan for Diabetes management as evidenced by taking medications as prescribed, monitoring and recording of CBG, and adherence to a ADA/ carb modified diet.   Interventions:  Collaboration with Birdie Sons, MD regarding development and update of comprehensive plan of care as evidenced by provider attestation and co-signature Inter-disciplinary care team collaboration (see longitudinal plan of care) Discussed compliance with treatment recommendations. Reports taking medications as prescribed and doing well with diabetes self-management. Declined need for additional diabetic resources.     Patient Goals/Self-Care Activities Self-administer medications as prescribed Attend all scheduled provider appointments Monitor blood glucose levels consistently and utilize recommended interventions Adhere to prescribed ADA/carb modified Notify provider or care management team with questions and new concerns as needed   Goal Met     Patient Care Plan: Iron Deficiency (Adult)  Completed 04/15/2021   Problem Identified: Iron Deficiency Resolved 04/15/2021     Long-Range Goal: Anemia Prevented or Managed Completed 04/15/2021  Start Date: 09/21/2020  Expected End Date: 12/20/2020  Priority: High  Note:    Current Barriers:  Chronic Disease Management support and  education needs related to Iron Deficiency  Case Manager Clinical Goal(s):  Over the next 90 days, patient will not require hospitalization or experience complications d/t iron deficiency.  Interventions:  Collaboration with Birdie Sons, MD regarding development and update of comprehensive plan of care as evidenced by provider attestation and co-signature Inter-disciplinary care team collaboration (see longitudinal plan of care) Patient reports taking medications as prescribed and following recommended treatment plan. Declined need for additional resources.   Patient Goals/Self-Care Activities: Continue taking medications and oral supplements as recommended. Maintain adequate hydration and adhere to recommended diet. Complete required labs and attend scheduled provider follow-up appointments Contact provider or care management team with questions or new concerns   Goal Met       [name] verbalized understanding of the information discussed during the telephonic outreach. Declined need for mailed/printed instructions. She has met her care management goals. Agreed to contact the clinic if additional outreach is required. The care management team will gladly assist.   Hannah [name] [facility] [phone]

## 2021-04-29 DIAGNOSIS — E611 Iron deficiency: Secondary | ICD-10-CM | POA: Diagnosis not present

## 2021-04-29 DIAGNOSIS — E119 Type 2 diabetes mellitus without complications: Secondary | ICD-10-CM

## 2021-05-02 ENCOUNTER — Ambulatory Visit (INDEPENDENT_AMBULATORY_CARE_PROVIDER_SITE_OTHER): Payer: Medicare HMO | Admitting: Family Medicine

## 2021-05-02 ENCOUNTER — Ambulatory Visit: Payer: Self-pay | Admitting: *Deleted

## 2021-05-02 ENCOUNTER — Encounter: Payer: Self-pay | Admitting: Family Medicine

## 2021-05-02 ENCOUNTER — Other Ambulatory Visit: Payer: Self-pay

## 2021-05-02 VITALS — BP 111/77 | HR 84 | Temp 98.6°F | Resp 18 | Wt 148.0 lb

## 2021-05-02 DIAGNOSIS — E860 Dehydration: Secondary | ICD-10-CM

## 2021-05-02 DIAGNOSIS — N3001 Acute cystitis with hematuria: Secondary | ICD-10-CM | POA: Diagnosis not present

## 2021-05-02 DIAGNOSIS — Z23 Encounter for immunization: Secondary | ICD-10-CM | POA: Diagnosis not present

## 2021-05-02 DIAGNOSIS — J329 Chronic sinusitis, unspecified: Secondary | ICD-10-CM

## 2021-05-02 DIAGNOSIS — R319 Hematuria, unspecified: Secondary | ICD-10-CM

## 2021-05-02 LAB — POCT URINALYSIS DIPSTICK
Bilirubin, UA: NEGATIVE
Glucose, UA: NEGATIVE
Ketones, UA: NEGATIVE
Nitrite, UA: NEGATIVE
Protein, UA: POSITIVE — AB
Spec Grav, UA: 1.02 (ref 1.010–1.025)
Urobilinogen, UA: 0.2 E.U./dL
pH, UA: 6 (ref 5.0–8.0)

## 2021-05-02 MED ORDER — SULFAMETHOXAZOLE-TRIMETHOPRIM 800-160 MG PO TABS: 1.0000 | ORAL_TABLET | Freq: Two times a day (BID) | ORAL | 0 refills | Status: DC

## 2021-05-02 MED ORDER — BENZONATATE 100 MG PO CAPS: 100.0000 mg | ORAL_CAPSULE | Freq: Two times a day (BID) | ORAL | 0 refills | Status: DC | PRN

## 2021-05-02 MED ORDER — CETIRIZINE HCL 10 MG PO TABS: 10.0000 mg | ORAL_TABLET | Freq: Every day | ORAL | 11 refills | Status: DC

## 2021-05-02 MED ORDER — FLUTICASONE PROPIONATE 50 MCG/ACT NA SUSP
2.0000 | Freq: Every day | NASAL | 6 refills | Status: DC
Start: 2021-05-02 — End: 2021-08-26

## 2021-05-02 MED ORDER — GUAIFENESIN-DM 100-10 MG/5ML PO SYRP: 5.0000 mL | ORAL_SOLUTION | ORAL | 0 refills | Status: DC | PRN

## 2021-05-02 NOTE — Telephone Encounter (Signed)
I returned pt's call.   She had called in earlier c/o having blood in her urine on Friday.   She took an OTC medication (doesn't know the name of it) which stopped the bleeding but she is having left flank pain and frequency or urination.    She is also c/o having a "sinus infection".   "I'm blowing green stuff from my nose and I'm coughing with a sore throat".   I made her an appt with Tally Joe, NP for today at 3:40.     I called into [facility] and made sure this was ok with [place] which it was since the slot was open.  I sent my notes to [facility] for Tally Joe.

## 2021-05-02 NOTE — Progress Notes (Signed)
I,[name],acting as a scribe for Gwyneth Sprout, FNP.,have documented all relevant documentation on the behalf of Gwyneth Sprout, FNP,as directed by  Gwyneth Sprout, FNP while in the presence of Gwyneth Sprout, FNP.  Established patient visit   Patient: Hannah [name]   DOB: 27-Apr-1951   70 y.o. Female  MRN: [id number] Visit Date: 05/02/2021  Today's healthcare provider: Gwyneth Sprout, FNP   Chief Complaint  Patient presents with   Hematuria   Subjective    Hematuria This is a new problem. The current episode started in the past 7 days (3 days ago). The problem has been gradually improving since onset. The pain is moderate. She describes her urine color as dark red. Irritative symptoms include frequency and urgency. Associated symptoms include chills, dysuria, genital pain and nausea. Pertinent negatives include no abdominal pain, bladder pain, bone pain, facial swelling, fever, flank pain, hematospermia, hesitancy, inability to urinate, urinary retention, vomiting or weight loss.     Patient states she had blood in her urine once, 3 days ago. Patient has also had symptoms of burning with urination, frequency, nausea, chills, sweats, and pelvic pressure. Patient treated symptoms with otc AZO; once on Friday.  Patient also has symptoms sore throat, sinus congestion, sinus pressure and pain- using Mucinex.    Medications: Outpatient Medications Prior to Visit  Medication Sig   anastrozole (ARIMIDEX) 1 MG tablet TAKE 1 TABLET BY MOUTH EVERY DAY   carvedilol (COREG) 12.5 MG tablet TAKE 1 TABLET BY MOUTH TWICE A DAY   Cholecalciferol (VITAMIN D3) 1000 UNITS CAPS Take 1 capsule by mouth daily.    cyanocobalamin 1000 MCG tablet Take 1,000 mcg by mouth daily.   FLUoxetine (PROZAC) 40 MG capsule TAKE 1 CAPSULE BY MOUTH EVERY DAY   glipiZIDE (GLUCOTROL XL) 5 MG 24 hr tablet TAKE 1 TABLET BY MOUTH EVERY DAY WITH BREAKFAST   metFORMIN (GLUCOPHAGE) 1000 MG tablet TAKE 1 TABLET BY MOUTH  TWICE A DAY FOR DIABETES   omeprazole (PRILOSEC) 20 MG capsule TAKE 1 CAPSULE BY MOUTH EVERY DAY   simvastatin (ZOCOR) 40 MG tablet TAKE 1 TABLET BY MOUTH EVERYDAY AT BEDTIME   valsartan-hydrochlorothiazide (DIOVAN-HCT) 80-12.5 MG tablet TAKE 1 TABLET BY MOUTH EVERY DAY   [DISCONTINUED] fluticasone (FLONASE) 50 MCG/ACT nasal spray SPRAY 2 SPRAYS INTO EACH NOSTRIL EVERY DAY (Patient taking differently: As needed)   No facility-administered medications prior to visit.    Review of Systems  Constitutional:  Positive for chills. Negative for fever and weight loss.  HENT:  Negative for facial swelling.   Gastrointestinal:  Positive for nausea. Negative for abdominal pain and vomiting.  Genitourinary:  Positive for dysuria, frequency, hematuria and urgency. Negative for flank pain and hesitancy.      Objective    BP 111/77 (BP Location: Left Arm, Patient Position: Sitting, Cuff Size: Normal)   Pulse 84   Temp 98.6 F (37 C) (Oral)   Resp 18   Wt 148 lb (67.1 kg)   LMP 08/30/2003   SpO2 96%   BMI 28.90 kg/m  {Show previous vital signs (optional):23777}  Physical Exam Vitals and nursing note reviewed.  Constitutional:      General: She is not in acute distress.    Appearance: Normal appearance. She is overweight. She is not ill-appearing, toxic-appearing or diaphoretic.  HENT:     Head: Normocephalic and atraumatic.     Right Ear: Tympanic membrane, ear canal and external ear normal.  Left Ear: Tympanic membrane, ear canal and external ear normal.     Nose: Nose normal. No congestion or rhinorrhea.     Mouth/Throat:     Mouth: Mucous membranes are moist.     Pharynx: Oropharynx is clear. No oropharyngeal exudate or posterior oropharyngeal erythema.  Eyes:     Pupils: Pupils are equal, round, and reactive to light.  Cardiovascular:     Rate and Rhythm: Normal rate and regular rhythm.     Pulses: Normal pulses.     Heart sounds: Normal heart sounds. No murmur heard.   No  friction rub. No gallop.  Pulmonary:     Effort: Pulmonary effort is normal. No respiratory distress.     Breath sounds: Normal breath sounds. No stridor. No wheezing, rhonchi or rales.  Chest:     Chest wall: No tenderness.  Abdominal:     General: Bowel sounds are normal. There is no distension.     Palpations: Abdomen is soft.     Tenderness: There is abdominal tenderness. There is no right CVA tenderness, left CVA tenderness or guarding.     Hernia: No hernia is present.  Musculoskeletal:        General: No swelling, tenderness, deformity or signs of injury. Normal range of motion.     Right lower leg: No edema.     Left lower leg: No edema.     Comments: Left mid back pain; negative CVA  Skin:    General: Skin is warm and dry.     Capillary Refill: Capillary refill takes less than 2 seconds.     Coloration: Skin is not jaundiced or pale.     Findings: No bruising, erythema, lesion or rash.  Neurological:     General: No focal deficit present.     Mental Status: She is alert and oriented to person, place, and time. Mental status is at baseline.     Cranial Nerves: No cranial nerve deficit.     Sensory: No sensory deficit.     Motor: No weakness.     Coordination: Coordination normal.  Psychiatric:        Mood and Affect: Mood normal.        Behavior: Behavior normal.        Thought Content: Thought content normal.        Judgment: Judgment normal.     No results found for any visits on 05/02/21.  Assessment & Plan     Problem List Items Addressed This Visit       Respiratory   Sinusitis    No infection to note Advised viral etiology Continued prophylactic treatment Advised masking and hand hygiene, especially following time with children at work       Relevant Medications   fluticasone (FLONASE) 50 MCG/ACT nasal spray   guaiFENesin-dextromethorphan (ROBITUSSIN DM) 100-10 MG/5ML syrup   benzonatate (TESSALON) 100 MG capsule   cetirizine (ZYRTEC) 10 MG tablet    sulfamethoxazole-trimethoprim (BACTRIM DS) 800-160 MG tablet     Genitourinary   Acute cystitis with hematuria    Start abx Advised to increase water intake Can RTC if symptoms continue/worsen Will f/u on culture      Relevant Medications   sulfamethoxazole-trimethoprim (BACTRIM DS) 800-160 MG tablet     Other   Hematuria - Primary    One episode; discussed kidney involvement in infection and need for increased fluid intake      Relevant Orders   POCT urinalysis dipstick   Dehydration  Pt reported drinking <16 oz of water/day; advised to increase water intake given setting of UTI Goal of 64 oz provided      Flu vaccine need    Provided today      Relevant Orders   Flu Vaccine QUAD High Dose(Fluad)     Return if symptoms worsen or fail to improve.     Vonna Kotyk, FNP, have reviewed all documentation for this visit. The documentation on 05/02/21 for the exam, diagnosis, procedures, and orders are all accurate and complete.    Gwyneth Sprout, [place] [phone] (phone) [phone] (fax)  [place]

## 2021-05-02 NOTE — Assessment & Plan Note (Signed)
Pt reported drinking <16 oz of water/day; advised to increase water intake given setting of UTI Goal of 64 oz provided

## 2021-05-02 NOTE — Assessment & Plan Note (Signed)
One episode; discussed kidney involvement in infection and need for increased fluid intake

## 2021-05-02 NOTE — Assessment & Plan Note (Signed)
Start abx Advised to increase water intake Can RTC if symptoms continue/worsen Will f/u on culture

## 2021-05-02 NOTE — Assessment & Plan Note (Signed)
Provided today 

## 2021-05-02 NOTE — Telephone Encounter (Signed)
Reason for Disposition  Side (flank) or back pain present  Answer Assessment - Initial Assessment Questions 1. COLOR of URINE: "Describe the color of the urine."  (e.g., tea-colored, pink, red, blood clots, bloody)     Pt called in c/o having blood in urine on Friday.   An OTC pill stopped the blood but I'm having pain in my left flank.   It's been hurting a while.  At least a week. 2. ONSET: "When did the bleeding start?"      Friday 3. EPISODES: "How many times has there been blood in the urine?" or "How many times today?"     On Friday only I started the OTC pill then 4. PAIN with URINATION: "Is there any pain with passing your urine?" If Yes, ask: "How bad is the pain?"  (Scale 1-10; or mild, moderate, severe)    - MILD - complains slightly about urination hurting    - MODERATE - interferes with normal activities      - SEVERE - excruciating, unwilling or unable to urinate because of the pain      No burning.  Going frequently.    5. FEVER: "Do you have a fever?" If Yes, ask: "What is your temperature, how was it measured, and when did it start?"     No 6. ASSOCIATED SYMPTOMS: "Are you passing urine more frequently than usual?"     Going more frequently 7. OTHER SYMPTOMS: "Do you have any other symptoms?" (e.g., back/flank pain, abdominal pain, vomiting)     Left flank pain.   I have a sinus infection.   When I blow my nose it's green.   I have a sore throat and coughing.   Headaches   8. PREGNANCY: "Is there any chance you are pregnant?" "When was your last menstrual period?"     N/A due age  Protocols used: Urine - Blood In-A-AH

## 2021-05-02 NOTE — Assessment & Plan Note (Signed)
No infection to note Advised viral etiology Continued prophylactic treatment Advised masking and hand hygiene, especially following time with children at work

## 2021-05-06 ENCOUNTER — Other Ambulatory Visit: Payer: Self-pay | Admitting: Family Medicine

## 2021-05-06 DIAGNOSIS — I10 Essential (primary) hypertension: Secondary | ICD-10-CM

## 2021-05-06 NOTE — Telephone Encounter (Signed)
Requested Prescriptions  Pending Prescriptions Disp Refills  . simvastatin (ZOCOR) 40 MG tablet [Pharmacy Med Name: SIMVASTATIN 40 MG TABLET] 30 tablet 0    Sig: TAKE 1 TABLET BY MOUTH EVERYDAY AT BEDTIME     Cardiovascular:  Antilipid - Statins Passed - 05/06/2021  2:13 AM      Passed - Total Cholesterol in normal range and within 360 days    Cholesterol, Total  Date Value Ref Range Status  02/15/2021 161 100 - 199 mg/dL Final         Passed - LDL in normal range and within 360 days    LDL Cholesterol (Calc)  Date Value Ref Range Status  06/13/2017 45 mg/dL (calc) Final    Comment:    Reference range: <100 . Desirable range <100 mg/dL for primary prevention;   <70 mg/dL for patients with CHD or diabetic patients  with > or = 2 CHD risk factors. [name] LDL-C is now calculated using the Martin-Hopkins  calculation, which is a validated novel method providing  better accuracy than the Friedewald equation in the  estimation of LDL-C.  Cresenciano Genre et al. Annamaria Helling. 9470;962(83): 2061-2068  (http://education.[employer].com/faq/FAQ164)    LDL Chol Calc (NIH)  Date Value Ref Range Status  02/15/2021 67 0 - 99 mg/dL Final   Direct LDL  Date Value Ref Range Status  02/07/2016 74.0 mg/dL Final    Comment:    Optimal:  <100 mg/dLNear or Above Optimal:  100-129 mg/dLBorderline High:  130-159 mg/dLHigh:  160-189 mg/dLVery High:  >190 mg/dL         Passed - HDL in normal range and within 360 days    HDL  Date Value Ref Range Status  02/15/2021 76 >39 mg/dL Final         Passed - Triglycerides in normal range and within 360 days    Triglycerides  Date Value Ref Range Status  02/15/2021 101 0 - 149 mg/dL Final         Passed - Patient is not pregnant      Passed - Valid encounter within last 12 months    Recent Outpatient Visits          4 days ago Hematuria, unspecified type   [facility] Gwyneth Sprout, FNP   2 months ago Annual physical exam   [facility] Birdie Sons, MD   8 months ago Type 2 diabetes mellitus without complication, without long-term current use of insulin [facility])   [facility] Birdie Sons, MD   11 months ago Type 2 diabetes mellitus without complication, without long-term current use of insulin ([place])   [facility] Birdie Sons, MD   1 year ago Type 2 diabetes mellitus without complication, without long-term current use of insulin [facility])   [place], Kirstie Peri, MD      Future Appointments            In 1 month Fisher, Kirstie Peri, MD [facility]           . valsartan-hydrochlorothiazide (DIOVAN-HCT) 80-12.5 MG tablet [Pharmacy Med Name: VALSARTAN-HCTZ 80-12.5 MG TAB] 30 tablet 0    Sig: TAKE 1 TABLET BY MOUTH EVERY DAY     Cardiovascular: ARB + Diuretic Combos Passed - 05/06/2021  2:13 AM      Passed - K in normal range and within 180 days    Potassium  Date Value Ref Range Status  02/15/2021 4.8 3.5 -  5.2 mmol/L Final         Passed - Na in normal range and within 180 days    Sodium  Date Value Ref Range Status  02/15/2021 138 134 - 144 mmol/L Final         Passed - Cr in normal range and within 180 days    Creat  Date Value Ref Range Status  06/13/2017 0.69 0.50 - 0.99 mg/dL Final    Comment:    For patients >33 years of age, the reference limit for Creatinine is approximately 13% higher for people identified as African-American. .    Creatinine, Ser  Date Value Ref Range Status  02/15/2021 0.82 0.57 - 1.00 mg/dL Final   Creatinine,U  Date Value Ref Range Status  02/07/2016 188.2 mg/dL Final         Passed - Ca in normal range and within 180 days    Calcium  Date Value Ref Range Status  02/15/2021 9.6 8.7 - 10.3 mg/dL Final   Calcium, Total  Date Value Ref Range Status  02/10/2014 9.9 8.5 - 10.1 mg/dL Final         Passed - Patient is not pregnant      Passed - Last BP in normal range     BP Readings from Last 1 Encounters:  05/02/21 111/77         Passed - Valid encounter within last 6 months    Recent Outpatient Visits          4 days ago Hematuria, unspecified type   [facility] Gwyneth Sprout, FNP   2 months ago Annual physical exam   [facility] Birdie Sons, MD   8 months ago Type 2 diabetes mellitus without complication, without long-term current use of insulin [facility])   [facility] Birdie Sons, MD   11 months ago Type 2 diabetes mellitus without complication, without long-term current use of insulin [facility])   [facility] Birdie Sons, MD   1 year ago Type 2 diabetes mellitus without complication, without long-term current use of insulin [facility])   [facility] Birdie Sons, MD      Future Appointments            In 1 month Fisher, Kirstie Peri, MD [facility], [place]

## 2021-05-07 LAB — CULTURE, URINE COMPREHENSIVE

## 2021-06-05 ENCOUNTER — Other Ambulatory Visit: Payer: Self-pay | Admitting: Family Medicine

## 2021-06-05 DIAGNOSIS — I10 Essential (primary) hypertension: Secondary | ICD-10-CM

## 2021-06-05 NOTE — Telephone Encounter (Signed)
Requested Prescriptions  Pending Prescriptions Disp Refills  . valsartan-hydrochlorothiazide (DIOVAN-HCT) 80-12.5 MG tablet [Pharmacy Med Name: VALSARTAN-HCTZ 80-12.5 MG TAB] 30 tablet 0    Sig: TAKE 1 TABLET BY MOUTH EVERY DAY     Cardiovascular: ARB + Diuretic Combos Passed - 06/05/2021  9:30 AM      Passed - K in normal range and within 180 days    Potassium  Date Value Ref Range Status  02/15/2021 4.8 3.5 - 5.2 mmol/L Final         Passed - Na in normal range and within 180 days    Sodium  Date Value Ref Range Status  02/15/2021 138 134 - 144 mmol/L Final         Passed - Cr in normal range and within 180 days    Creat  Date Value Ref Range Status  06/13/2017 0.69 0.50 - 0.99 mg/dL Final    Comment:    For patients >89 years of age, the reference limit for Creatinine is approximately 13% higher for people identified as African-American. .    Creatinine, Ser  Date Value Ref Range Status  02/15/2021 0.82 0.57 - 1.00 mg/dL Final   Creatinine,U  Date Value Ref Range Status  02/07/2016 188.2 mg/dL Final         Passed - Ca in normal range and within 180 days    Calcium  Date Value Ref Range Status  02/15/2021 9.6 8.7 - 10.3 mg/dL Final   Calcium, Total  Date Value Ref Range Status  02/10/2014 9.9 8.5 - 10.1 mg/dL Final         Passed - Patient is not pregnant      Passed - Last BP in normal range    BP Readings from Last 1 Encounters:  05/02/21 111/77         Passed - Valid encounter within last 6 months    Recent Outpatient Visits          1 month ago Hematuria, unspecified type   [facility] Gwyneth Sprout, FNP   3 months ago Annual physical exam   [facility] Birdie Sons, MD   9 months ago Type 2 diabetes mellitus without complication, without long-term current use of insulin [facility])   [facility] Birdie Sons, MD   1 year ago Type 2 diabetes mellitus without complication, without long-term current  use of insulin [facility])   [facility] Birdie Sons, MD   1 year ago Type 2 diabetes mellitus without complication, without long-term current use of insulin [facility])   [facility] Birdie Sons, MD      Future Appointments            In 3 weeks Fisher, Kirstie Peri, MD [facility], [place]

## 2021-06-05 NOTE — Telephone Encounter (Signed)
Requested Prescriptions  Pending Prescriptions Disp Refills  . simvastatin (ZOCOR) 40 MG tablet [Pharmacy Med Name: SIMVASTATIN 40 MG TABLET] 90 tablet 1    Sig: TAKE 1 TABLET BY MOUTH EVERYDAY AT BEDTIME     Cardiovascular:  Antilipid - Statins Passed - 06/05/2021  2:31 PM      Passed - Total Cholesterol in normal range and within 360 days    Cholesterol, Total  Date Value Ref Range Status  02/15/2021 161 100 - 199 mg/dL Final         Passed - LDL in normal range and within 360 days    LDL Cholesterol (Calc)  Date Value Ref Range Status  06/13/2017 45 mg/dL (calc) Final    Comment:    Reference range: <100 . Desirable range <100 mg/dL for primary prevention;   <70 mg/dL for patients with CHD or diabetic patients  with > or = 2 CHD risk factors. [name] LDL-C is now calculated using the Martin-Hopkins  calculation, which is a validated novel method providing  better accuracy than the Friedewald equation in the  estimation of LDL-C.  Cresenciano Genre et al. Annamaria Helling. 4270;623(76): 2061-2068  (http://education.[employer].com/faq/FAQ164)    LDL Chol Calc (NIH)  Date Value Ref Range Status  02/15/2021 67 0 - 99 mg/dL Final   Direct LDL  Date Value Ref Range Status  02/07/2016 74.0 mg/dL Final    Comment:    Optimal:  <100 mg/dLNear or Above Optimal:  100-129 mg/dLBorderline High:  130-159 mg/dLHigh:  160-189 mg/dLVery High:  >190 mg/dL         Passed - HDL in normal range and within 360 days    HDL  Date Value Ref Range Status  02/15/2021 76 >39 mg/dL Final         Passed - Triglycerides in normal range and within 360 days    Triglycerides  Date Value Ref Range Status  02/15/2021 101 0 - 149 mg/dL Final         Passed - Patient is not pregnant      Passed - Valid encounter within last 12 months    Recent Outpatient Visits          1 month ago Hematuria, unspecified type   [facility] Gwyneth Sprout, FNP   3 months ago Annual physical exam   [facility] Birdie Sons, MD   9 months ago Type 2 diabetes mellitus without complication, without long-term current use of insulin [facility])   [facility] Birdie Sons, MD   1 year ago Type 2 diabetes mellitus without complication, without long-term current use of insulin [facility])   [facility] Birdie Sons, MD   1 year ago Type 2 diabetes mellitus without complication, without long-term current use of insulin [facility])   [facility] Birdie Sons, MD      Future Appointments            In 3 weeks Fisher, Kirstie Peri, MD [facility], [place]

## 2021-06-09 DIAGNOSIS — Z853 Personal history of malignant neoplasm of breast: Secondary | ICD-10-CM | POA: Diagnosis not present

## 2021-06-12 IMAGING — MG DIGITAL SCREENING BILAT W/ TOMO W/ CAD
6 of 10 series · 6 of 30 positions shown · non-contrast
Comparison: Previous exam(s).

CLINICAL DATA: Screening.

EXAM:
DIGITAL SCREENING BILATERAL MAMMOGRAM WITH TOMO AND CAD

[R MLO synth-2D]
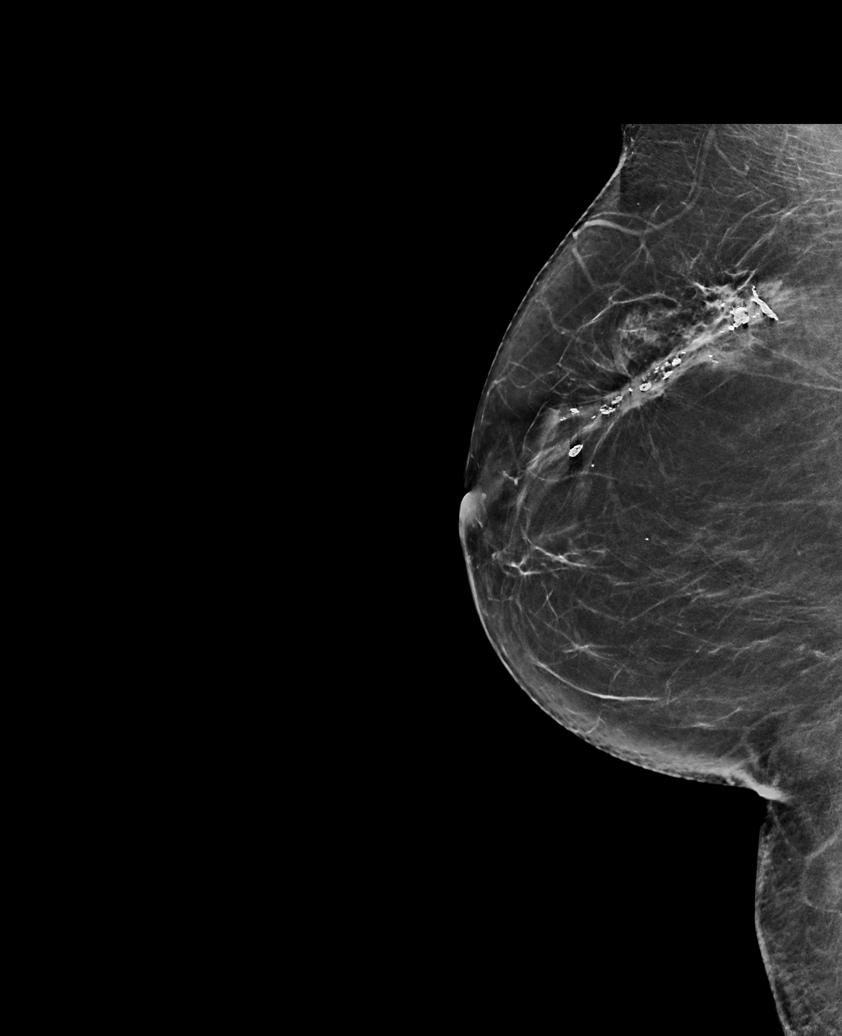

[R XCCL synth-2D]
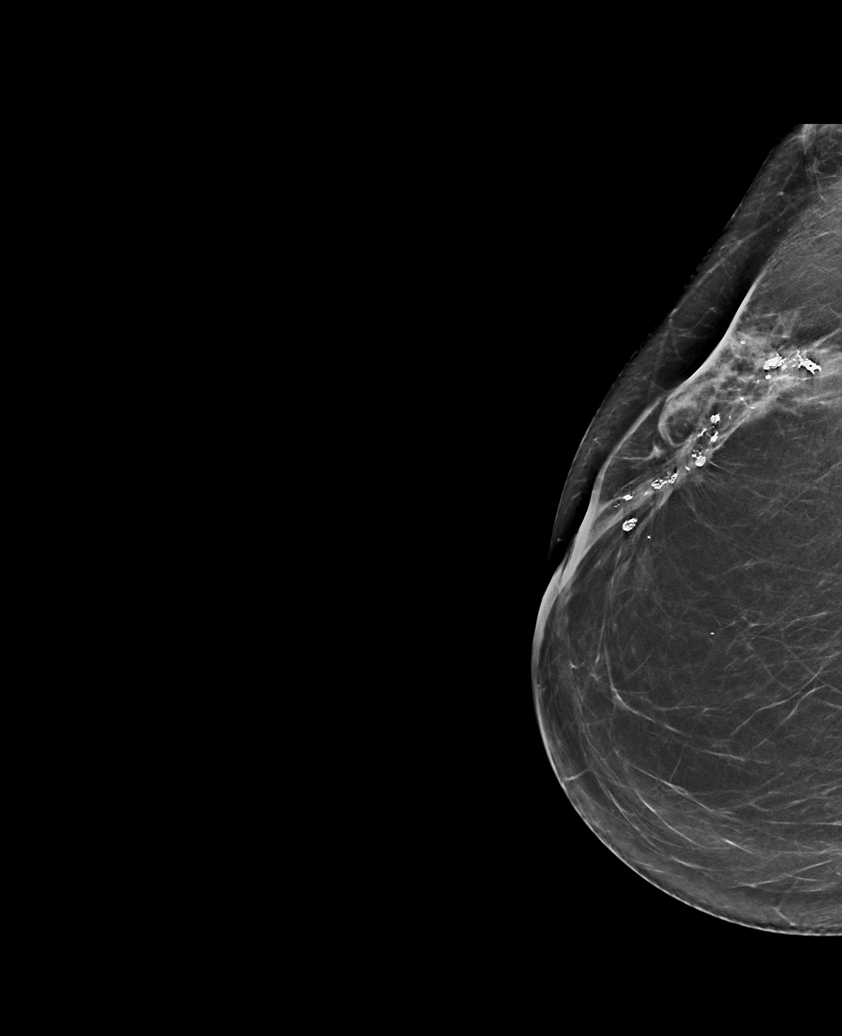

[R CC synth-2D]
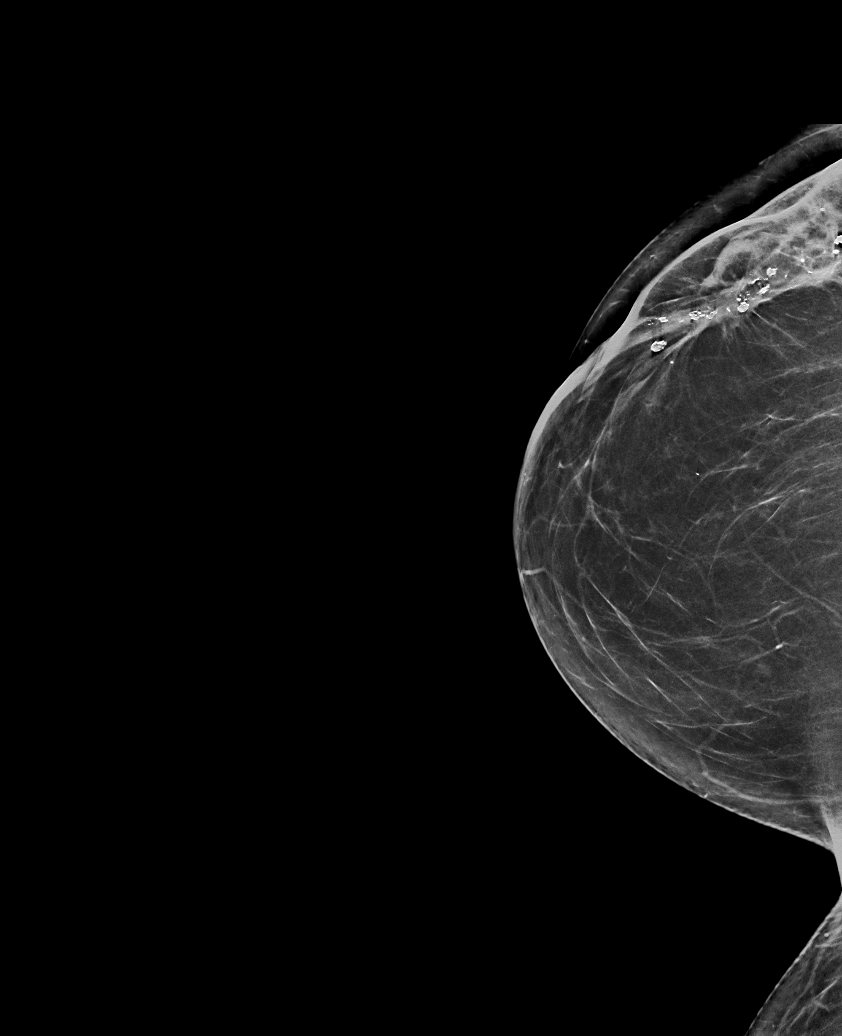

[L CC synth-2D]
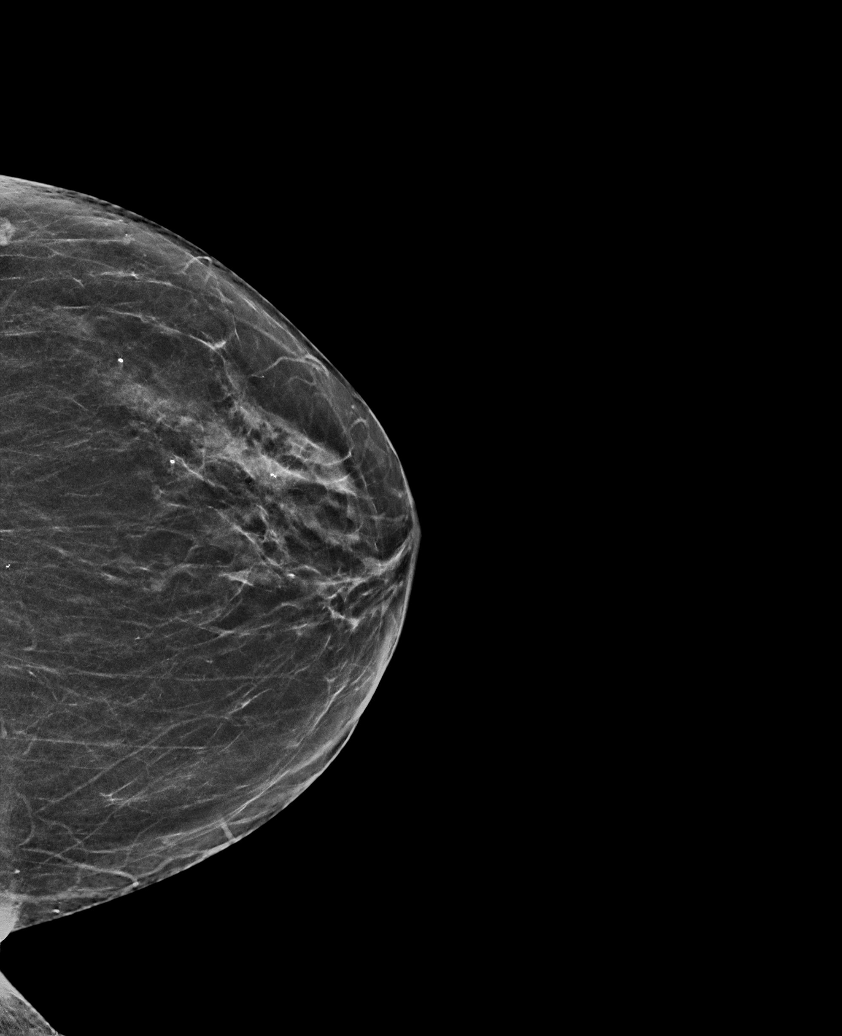

[L MLO synth-2D]
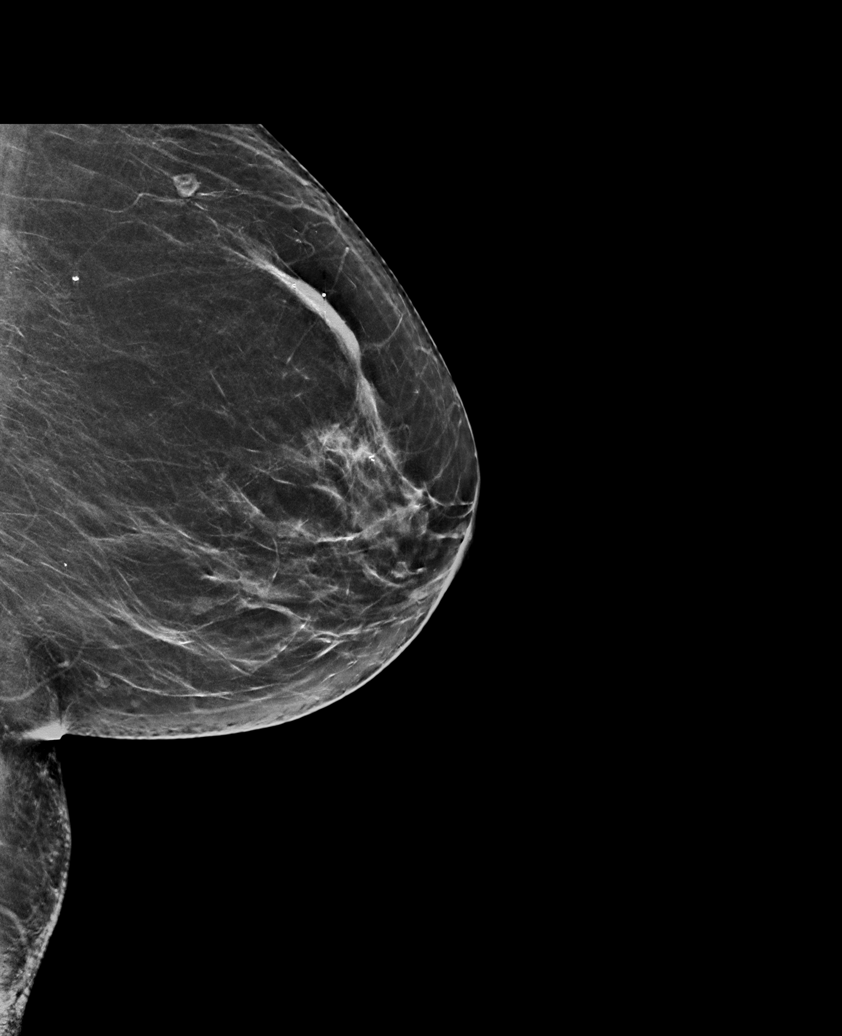

[L CC tomo · tomo slice 29/56.0]
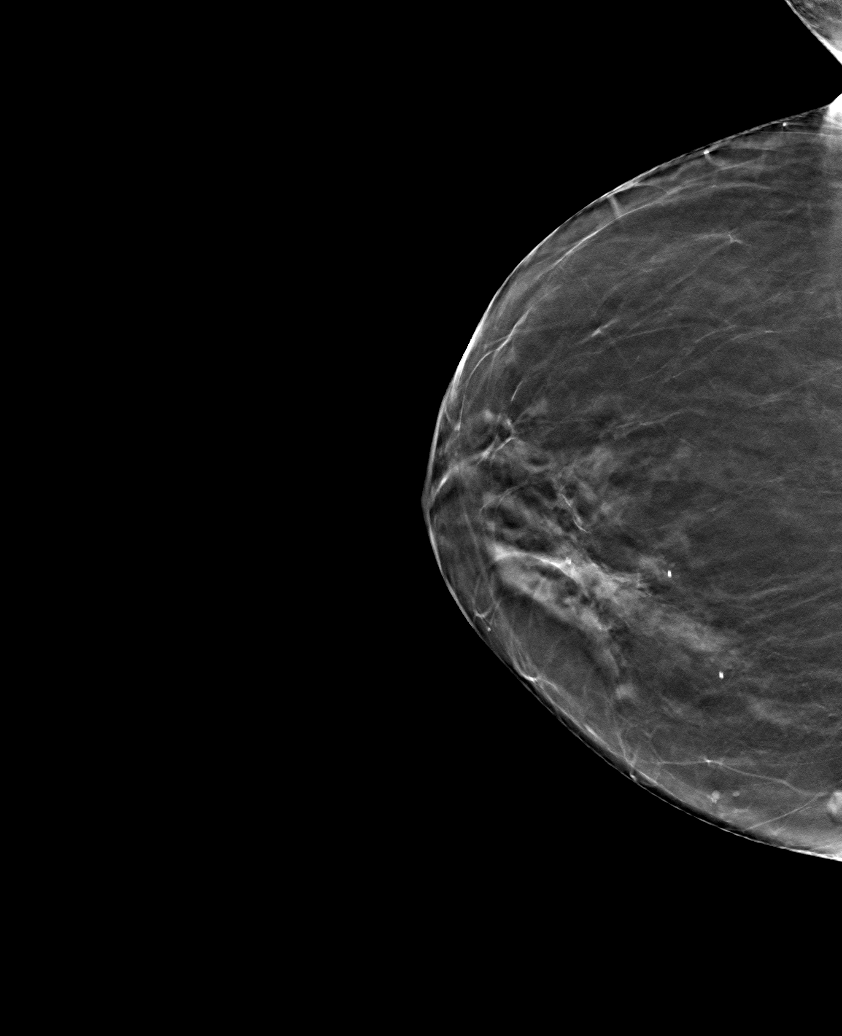

[6 of 30 positions shown; findings below may reference images not displayed]

ACR Breast Density Category b: There are scattered areas of
fibroglandular density.
FINDINGS: There are no findings suspicious for malignancy. Images were
processed with CAD.
IMPRESSION: No mammographic evidence of malignancy. A result letter of this
screening mammogram will be mailed directly to the patient.

RECOMMENDATION:
Screening mammogram in one year. (Code:[id number])

BI-RADS CATEGORY  1: Negative.

## 2021-06-22 NOTE — Telephone Encounter (Signed)
3 attempts have been made to return this pt's call.   She had called in earlier today c/o coughing for 2 weeks.   She was seen on 05/02/2021 and given Tessalon Perles.  She is either requesting a refill of this or wanting an appt for next Monday or Tuesday.  Forwarded to [facility] since 3 attempts have been done to contact her.

## 2021-06-22 NOTE — Telephone Encounter (Addendum)
Dr. Caryn Section is out of the office. Please review and advise on patients request.   Last prescription sent in on 05/02/2021 #20 with 0 refills

## 2021-06-22 NOTE — Telephone Encounter (Signed)
Pt has had cough for 2 weeks.  She said she was prescribed tessalon pearls a month ago (10/03)  would like to know if the dr will refill this unless she can get appt next Mon or Tues.  Attempted to call patient to follow up of symptoms- left message to call office.

## 2021-06-22 NOTE — Telephone Encounter (Signed)
Spoke with patient on the phone and agree that patient needs office visit since she was last assessed for this problem in October. Schedule patient appt with PCP since your schedule is full. Please review request for Rx below. KW

## 2021-06-22 NOTE — Addendum Note (Signed)
Addended by: Randal Buba on: 06/22/2021 02:40 PM   Modules accepted: Orders

## 2021-06-22 NOTE — Telephone Encounter (Signed)
Second attempt to reach patient- left message to call office regarding symptoms.

## 2021-06-23 ENCOUNTER — Other Ambulatory Visit: Payer: Self-pay | Admitting: Family Medicine

## 2021-06-23 DIAGNOSIS — I10 Essential (primary) hypertension: Secondary | ICD-10-CM

## 2021-06-23 DIAGNOSIS — C50911 Malignant neoplasm of unspecified site of right female breast: Secondary | ICD-10-CM

## 2021-06-28 ENCOUNTER — Encounter: Payer: Self-pay | Admitting: Family Medicine

## 2021-06-28 ENCOUNTER — Ambulatory Visit
Admission: RE | Admit: 2021-06-28 | Discharge: 2021-06-28 | Disposition: A | Discharge status: Home / Self Care | Payer: Medicare HMO | Source: Ambulatory Visit | Attending: Family Medicine | Admitting: Family Medicine

## 2021-06-28 ENCOUNTER — Other Ambulatory Visit: Payer: Self-pay

## 2021-06-28 ENCOUNTER — Ambulatory Visit
Admission: RE | Admit: 2021-06-28 | Discharge: 2021-06-28 | Disposition: A | Discharge status: Home / Self Care | Payer: Medicare HMO | Attending: Family Medicine | Admitting: Family Medicine

## 2021-06-28 ENCOUNTER — Ambulatory Visit (INDEPENDENT_AMBULATORY_CARE_PROVIDER_SITE_OTHER): Payer: Medicare HMO | Admitting: Family Medicine

## 2021-06-28 VITALS — BP 132/73 | HR 79 | Temp 98.7°F | Resp 18 | Wt 148.3 lb

## 2021-06-28 DIAGNOSIS — R059 Cough, unspecified: Secondary | ICD-10-CM | POA: Diagnosis not present

## 2021-06-28 NOTE — Progress Notes (Addendum)
Established patient visit   Patient: Hannah [name]   DOB: March 25, 1951   70 y.o. Female  MRN: [id number] Visit Date: 06/28/2021  Today's healthcare provider: Lelon Huh, MD   Chief Complaint  Patient presents with   Cough   Subjective    Cough This is a new problem. The current episode started more than 1 month ago. The problem has been gradually improving. The cough is Productive of blood-tinged sputum (green- bloody colored sputum). Associated symptoms include chills, ear pain, headaches, hemoptysis, myalgias, nasal congestion, postnasal drip, rhinorrhea, a sore throat and sweats. Pertinent negatives include no chest pain, ear congestion, fever, shortness of breath or wheezing. The symptoms are aggravated by lying down. Treatments tried: Mucinex, OTC Cough, sinus and cold medication. The treatment provided no relief.       Medications: Outpatient Medications Prior to Visit  Medication Sig   carvedilol (COREG) 12.5 MG tablet TAKE 1 TABLET BY MOUTH TWICE A DAY   cetirizine (ZYRTEC) 10 MG tablet Take 1 tablet (10 mg total) by mouth daily.   Cholecalciferol (VITAMIN D3) 1000 UNITS CAPS Take 1 capsule by mouth daily.    cyanocobalamin 1000 MCG tablet Take 1,000 mcg by mouth daily.   FLUoxetine (PROZAC) 40 MG capsule TAKE 1 CAPSULE BY MOUTH EVERY DAY   fluticasone (FLONASE) 50 MCG/ACT nasal spray Place 2 sprays into both nostrils daily.   glipiZIDE (GLUCOTROL XL) 5 MG 24 hr tablet TAKE 1 TABLET BY MOUTH EVERY DAY WITH BREAKFAST   metFORMIN (GLUCOPHAGE) 1000 MG tablet TAKE 1 TABLET BY MOUTH TWICE A DAY FOR DIABETES   omeprazole (PRILOSEC) 20 MG capsule TAKE 1 CAPSULE BY MOUTH EVERY DAY   simvastatin (ZOCOR) 40 MG tablet TAKE 1 TABLET BY MOUTH EVERYDAY AT BEDTIME   valsartan-hydrochlorothiazide (DIOVAN-HCT) 80-12.5 MG tablet Take 1 tablet by mouth daily.   [DISCONTINUED] anastrozole (ARIMIDEX) 1 MG tablet TAKE 1 TABLET BY MOUTH EVERY DAY (Patient not taking: Reported on  06/28/2021)   [DISCONTINUED] benzonatate (TESSALON) 100 MG capsule Take 1 capsule (100 mg total) by mouth 2 (two) times daily as needed for cough. (Patient not taking: Reported on 06/28/2021)   [DISCONTINUED] guaiFENesin-dextromethorphan (ROBITUSSIN DM) 100-10 MG/5ML syrup Take 5 mLs by mouth every 4 (four) hours as needed for cough. (Patient not taking: Reported on 06/28/2021)   [DISCONTINUED] sulfamethoxazole-trimethoprim (BACTRIM DS) 800-160 MG tablet Take 1 tablet by mouth 2 (two) times daily.   No facility-administered medications prior to visit.    Review of Systems  Constitutional:  Positive for chills, diaphoresis and fatigue. Negative for appetite change and fever.  HENT:  Positive for congestion, ear pain, postnasal drip, rhinorrhea, sneezing and sore throat.   Respiratory:  Positive for cough. Negative for chest tightness, shortness of breath and wheezing.   Cardiovascular:  Negative for chest pain and palpitations.  Gastrointestinal:  Positive for nausea. Negative for abdominal pain and vomiting.  Musculoskeletal:  Positive for myalgias.  Neurological:  Positive for headaches. Negative for dizziness and weakness.      Objective    BP 132/73 (BP Location: Left Arm, Patient Position: Sitting, Cuff Size: Normal)   Pulse 79   Temp 98.7 F (37.1 C) (Oral)   Resp 18   Wt 148 lb 4.8 oz (67.3 kg)   LMP 08/30/2003   SpO2 98% Comment: room air  BMI 28.96 kg/m  {Show previous vital signs (optional):23777}  Physical Exam  General Appearance:     Well developed, well nourished female, alert, cooperative, in  no acute distress  HENT:   neck without nodes, throat normal without erythema or exudate, sinuses nontender, post nasal drip noted, and nasal mucosa congested  Eyes:    PERRL, conjunctiva/corneas clear, EOM's intact       Lungs:     Clear to auscultation bilaterally, respirations unlabored  Heart:    Normal heart rate. Normal rhythm. No murmurs, rubs, or gallops.     Neurologic:   Awake, alert, oriented x 3. No apparent focal neurological           defect.        Assessment & Plan     1. Cough, unspecified type Persistent for over a month, no improvement with OTC cold and allergy medications.  - DG Chest 2 View; No active cardiopulmonary disease.  Cover with doxycyline, call if not much better when finished. Consider trial of PPT or montelukast.      The entirety of the information documented in the History of Present Illness, Review of Systems and Physical Exam were personally obtained by me. Portions of this information were initially documented by the CMA and reviewed by me for thoroughness and accuracy.     Lelon Huh, MD  [facility] [phone] (phone) [phone] (fax)  [place]

## 2021-06-29 ENCOUNTER — Ambulatory Visit: Payer: Self-pay | Admitting: *Deleted

## 2021-06-29 MED ORDER — DOXYCYCLINE HYCLATE 100 MG PO TABS
100.0000 mg | ORAL_TABLET | Freq: Two times a day (BID) | ORAL | 0 refills | Status: AC
Start: 2021-06-29 — End: 2021-07-09

## 2021-06-29 NOTE — Telephone Encounter (Signed)
Patient given results by L. Volanda Napoleon, CMA, see result notes.   Message from Jodie Echevaria sent at 06/29/2021 11:34 AM EST  Patient called in say that she had a chest Xray on yesterday and was expecting a call from Dr Caryn Section or nurse also say she waiting on antibiotics to be called in to her pharmacy. Please advise Ph# [phone]

## 2021-06-29 NOTE — Addendum Note (Signed)
Addended by: Lelon Huh E on: 06/29/2021 12:17 PM   Modules accepted: Orders

## 2021-06-29 NOTE — Telephone Encounter (Signed)
Patient called in say that she had a chest Xray on yesterday and was expecting a call from Dr Caryn Section or nurse also say she waiting on antibiotics to be called in to her pharmacy. Please advise Ph# [phone]   Called patient to review chest x ray results per message from Dr. Caryn Section yesterday and medication doxycyline that was called in per Dr. Caryn Section to pharmacy. No answer, left message to call clinic back at [phone].

## 2021-07-01 ENCOUNTER — Ambulatory Visit: Payer: Self-pay | Admitting: Family Medicine

## 2021-07-15 ENCOUNTER — Ambulatory Visit: Payer: Self-pay

## 2021-07-15 MED ORDER — MONTELUKAST SODIUM 10 MG PO TABS: 10.0000 mg | ORAL_TABLET | Freq: Every day | ORAL | 0 refills | Status: DC

## 2021-07-15 NOTE — Telephone Encounter (Signed)
Answer Assessment - Initial Assessment Questions 1. ONSET: "When did the cough begin?"      Started back today 2. SEVERITY: "How bad is the cough today?"      At times 3. SPUTUM: "Describe the color of your sputum" (none, dry cough; clear, white, yellow, green)     no 4. HEMOPTYSIS: "Are you coughing up any blood?" If so ask: "How much?" (flecks, streaks, tablespoons, etc.)     bo 5. DIFFICULTY BREATHING: "Are you having difficulty breathing?" If Yes, ask: "How bad is it?" (e.g., mild, moderate, severe)    - MILD: No SOB at rest, mild SOB with walking, speaks normally in sentences, can lie down, no retractions, pulse < 100.    - MODERATE: SOB at rest, SOB with minimal exertion and prefers to sit, cannot lie down flat, speaks in phrases, mild retractions, audible wheezing, pulse 100-120.    - SEVERE: Very SOB at rest, speaks in single words, struggling to breathe, sitting hunched forward, retractions, pulse > 120      no 6. FEVER: "Do you have a fever?" If Yes, ask: "What is your temperature, how was it measured, and when did it start?"     no 7. CARDIAC HISTORY: "Do you have any history of heart disease?" (e.g., heart attack, congestive heart failure)      no 8. LUNG HISTORY: "Do you have any history of lung disease?"  (e.g., pulmonary embolus, asthma, emphysema)     no 9. PE RISK FACTORS: "Do you have a history of blood clots?" (or: recent major surgery, recent prolonged travel, bedridden)     no 10. OTHER SYMPTOMS: "Do you have any other symptoms?" (e.g., runny nose, wheezing, chest pain)       no 11. PREGNANCY: "Is there any chance you are pregnant?" "When was your last menstrual period?"       no 12. TRAVEL: "Have you traveled out of the country in the last month?" (e.g., travel history, exposures)       no  Protocols used: Cough - Acute Non-Productive-A-AH Just got over cough that Dr. Caryn Section gave her an antibiotic. She has finished meds and cough has restarted. She feels it is  linked to same illness, not something new. Would like something called in for cough.

## 2021-07-15 NOTE — Telephone Encounter (Signed)
Try montelukast (Singulair) [facility] [place]

## 2021-07-15 NOTE — Telephone Encounter (Signed)
Please advise 

## 2021-07-15 NOTE — Telephone Encounter (Signed)
Patient called, left VM to return the call to the office to discuss symptoms with a nurse.   Summary: cough/antibiotic not working   Pt called and stated that she has just finished antibiotic and still has a cough. Pt would like to get a clinical message to Dr Caryn Section. Please advise

## 2021-07-21 ENCOUNTER — Other Ambulatory Visit: Payer: Self-pay | Admitting: Family Medicine

## 2021-07-21 NOTE — Telephone Encounter (Signed)
Call to patient- she did pick up Rx. Duplicate request- not needed Requested Prescriptions  Pending Prescriptions Disp Refills   montelukast (SINGULAIR) 10 MG tablet [Pharmacy Med Name: MONTELUKAST SOD 10 MG TABLET] 30 tablet 0    Sig: TAKE 1 TABLET BY MOUTH EVERYDAY AT BEDTIME     Pulmonology:  Leukotriene Inhibitors Passed - 07/21/2021 12:19 PM      Passed - Valid encounter within last 12 months    Recent Outpatient Visits          3 weeks ago Cough, unspecified type   [name] Birdie Sons, MD   2 months ago Hematuria, unspecified type   [facility] Gwyneth Sprout, FNP   4 months ago Annual physical exam   [name] Birdie Sons, MD   11 months ago Type 2 diabetes mellitus without complication, without long-term current use of insulin [name])   [name] Birdie Sons, MD   1 year ago Type 2 diabetes mellitus without complication, without long-term current use of insulin [name])   [place], Kirstie Peri, MD      Future Appointments            Tomorrow Mikey Kirschner, PA-C [name], [facility]

## 2021-07-22 ENCOUNTER — Other Ambulatory Visit: Payer: Self-pay

## 2021-07-22 ENCOUNTER — Ambulatory Visit (INDEPENDENT_AMBULATORY_CARE_PROVIDER_SITE_OTHER): Payer: Medicare HMO | Admitting: Physician Assistant

## 2021-07-22 ENCOUNTER — Encounter: Payer: Self-pay | Admitting: Physician Assistant

## 2021-07-22 VITALS — BP 124/71 | HR 83 | Temp 97.5°F | Ht 60.0 in | Wt 145.2 lb

## 2021-07-22 DIAGNOSIS — J040 Acute laryngitis: Secondary | ICD-10-CM

## 2021-07-22 DIAGNOSIS — J209 Acute bronchitis, unspecified: Secondary | ICD-10-CM | POA: Diagnosis not present

## 2021-07-22 MED ORDER — ALBUTEROL SULFATE HFA 108 (90 BASE) MCG/ACT IN AERS: 2.0000 | INHALATION_SPRAY | Freq: Four times a day (QID) | RESPIRATORY_TRACT | 2 refills | Status: DC | PRN

## 2021-07-22 MED ORDER — PREDNISONE 20 MG PO TABS: 20.0000 mg | ORAL_TABLET | Freq: Every day | ORAL | 0 refills | Status: DC

## 2021-07-22 NOTE — Progress Notes (Signed)
Established patient visit   Patient: Hannah [name]   DOB: [birth year]   70 y.o. Female  MRN: [id number] Visit Date: 07/22/2021  Today's healthcare provider: Mikey Kirschner, PA-C   Chief Complaint  Patient presents with   Cough   Hoarse   Subjective    HPI  Kirsi is a 70 y/o female who presents today with cough, chest congestion, productive mucous, wheezing and hoarseness. The cough has been presents for 1-3 months, and now worsened. Reports hoarseness only happened today. Has questions about RSV test today. Denies fevers, chills, body aches, SOB.   Medications: Outpatient Medications Prior to Visit  Medication Sig   carvedilol (COREG) 12.5 MG tablet TAKE 1 TABLET BY MOUTH TWICE A DAY   cetirizine (ZYRTEC) 10 MG tablet Take 1 tablet (10 mg total) by mouth daily.   Cholecalciferol (VITAMIN D3) 1000 UNITS CAPS Take 1 capsule by mouth daily.    cyanocobalamin 1000 MCG tablet Take 1,000 mcg by mouth daily.   FLUoxetine (PROZAC) 40 MG capsule TAKE 1 CAPSULE BY MOUTH EVERY DAY   fluticasone (FLONASE) 50 MCG/ACT nasal spray Place 2 sprays into both nostrils daily.   glipiZIDE (GLUCOTROL XL) 5 MG 24 hr tablet TAKE 1 TABLET BY MOUTH EVERY DAY WITH BREAKFAST   metFORMIN (GLUCOPHAGE) 1000 MG tablet TAKE 1 TABLET BY MOUTH TWICE A DAY FOR DIABETES   montelukast (SINGULAIR) 10 MG tablet Take 1 tablet (10 mg total) by mouth at bedtime.   omeprazole (PRILOSEC) 20 MG capsule TAKE 1 CAPSULE BY MOUTH EVERY DAY   simvastatin (ZOCOR) 40 MG tablet TAKE 1 TABLET BY MOUTH EVERYDAY AT BEDTIME   valsartan-hydrochlorothiazide (DIOVAN-HCT) 80-12.5 MG tablet Take 1 tablet by mouth daily.   No facility-administered medications prior to visit.    Review of Systems  HENT:  Positive for sore throat and voice change.   Respiratory:  Positive for cough and wheezing.   Cardiovascular:  Negative for chest pain.  Neurological:  Negative for headaches.      Objective    BP 124/71 (BP Location: Left  Arm, Patient Position: Sitting, Cuff Size: Normal)    Pulse 83    Temp (!) 97.5 F (36.4 C) (Oral)    Ht 5' (1.524 m)    Wt 145 lb 3.2 oz (65.9 kg)    LMP 08/30/2003    SpO2 100%    BMI 28.36 kg/m  Blood pressure 124/71, pulse 83, temperature (!) 97.5 F (36.4 C), temperature source Oral, height 5' (1.524 m), weight 145 lb 3.2 oz (65.9 kg), last menstrual period 08/30/2003, SpO2 100 %.   Physical Exam Constitutional:      General: She is awake.     Appearance: She is well-developed.  HENT:     Head: Normocephalic.  Eyes:     Conjunctiva/sclera: Conjunctivae normal.  Cardiovascular:     Rate and Rhythm: Normal rate and regular rhythm.     Heart sounds: Normal heart sounds.  Pulmonary:     Effort: Pulmonary effort is normal.     Breath sounds: Examination of the right-middle field reveals wheezing. Examination of the right-lower field reveals wheezing. Examination of the left-lower field reveals wheezing. Wheezing present.  Skin:    General: Skin is warm.  Neurological:     Mental Status: She is alert and oriented to person, place, and time.  Psychiatric:        Attention and Perception: Attention normal.        Mood and Affect: Mood  normal.        Speech: Speech normal.        Behavior: Behavior is cooperative.     No results found for any visits on 07/22/21.  Assessment & Plan     Acute bronchitis 2. 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      I, Mikey Kirschner, PA-C have reviewed all documentation for this visit. The documentation on  07/22/2021  for the exam, diagnosis, procedures, and orders are all accurate and complete.    Mikey Kirschner, PA-C  [facility] [phone] (phone) [phone] (fax)  [place]

## 2021-07-24 LAB — COVID-19, FLU A+B AND RSV
Influenza A, NAA: NOT DETECTED
Influenza B, NAA: NOT DETECTED
RSV, NAA: NOT DETECTED
SARS-CoV-2, NAA: NOT DETECTED

## 2021-08-24 ENCOUNTER — Other Ambulatory Visit: Payer: Self-pay | Admitting: Family Medicine

## 2021-08-24 DIAGNOSIS — J209 Acute bronchitis, unspecified: Secondary | ICD-10-CM

## 2021-08-24 NOTE — Telephone Encounter (Signed)
Please review refill request.   Last office visit: 07/22/2021 (seen by Mikey Kirschner, PA-C). Prescribed Prednisone for acute bronchitis.

## 2021-08-24 NOTE — Telephone Encounter (Signed)
Medication Refill - Medication: predniSONE (DELTASONE) 20 MG tablet  Has the patient contacted their pharmacy? Yes.   (Agent: If no, request that the patient contact the pharmacy for the refill. If patient does not wish to contact the pharmacy document the reason why and proceed with request.) (Agent: If yes, when and what did the pharmacy advise?)  Preferred Pharmacy (with phone number or street name):  [id number] [place], [address] [place] [address]  Phone: [phone] Fax: [phone]   Has the patient been seen for an appointment in the last year OR does the patient have an upcoming appointment? Yes.    Agent: Please be advised that RX refills may take up to 3 business days. We ask that you follow-up with your pharmacy.

## 2021-08-25 ENCOUNTER — Ambulatory Visit: Payer: Self-pay | Admitting: *Deleted

## 2021-08-25 ENCOUNTER — Telehealth: Payer: Self-pay | Admitting: Family Medicine

## 2021-08-25 NOTE — Telephone Encounter (Signed)
Blase Mess A 32 minutes ago (2:46 PM)   Pt called back communicated message to the patient regarding appt for tomorrow. Pt expressed understanding.      Note

## 2021-08-25 NOTE — Telephone Encounter (Signed)
°  Chief Complaint: medication request- prednisone Symptoms: cough, fatigue Frequency: patient states she has been coughing since November - she has been treated several times Pertinent Negatives: Patient denies fever, congestion Disposition: [] ED /[] Urgent Care (no appt availability in office) / [] Appointment(In office/virtual)/ []  [place] [facility]/ [] Home Care/ [x] Refused Recommended Disposition /[] [place] [facility]/ []  Follow-up with PCP Additional Notes: Advised UC- patient wants PCP to Rx her medication- advised provider not in office today- patient states she can wait until tomorrow for PCP response   Reason for Disposition  Cough has been present for > 3 weeks  Answer Assessment - Initial Assessment Questions 1. ONSET: "When did the cough begin?"      Since November- treated several times- cough has never gone away- maybe 1-2 days 2. SEVERITY: "How bad is the cough today?"      Dry cough 3. SPUTUM: "Describe the color of your sputum" (none, dry cough; clear, white, yellow, green)     clear 4. HEMOPTYSIS: "Are you coughing up any blood?" If so ask: "How much?" (flecks, streaks, tablespoons, etc.)     no 5. DIFFICULTY BREATHING: "Are you having difficulty breathing?" If Yes, ask: "How bad is it?" (e.g., mild, moderate, severe)    - MILD: No SOB at rest, mild SOB with walking, speaks normally in sentences, can lie down, no retractions, pulse < 100.    - MODERATE: SOB at rest, SOB with minimal exertion and prefers to sit, cannot lie down flat, speaks in phrases, mild retractions, audible wheezing, pulse 100-120.    - SEVERE: Very SOB at rest, speaks in single words, struggling to breathe, sitting hunched forward, retractions, pulse > 120      Normal breathing- at times she does get SOB with cough 6. FEVER: "Do you have a fever?" If Yes, ask: "What is your temperature, how was it measured, and when did it start?"     no 7. CARDIAC HISTORY: "Do you have any history of heart  disease?" (e.g., heart attack, congestive heart failure)      no 8. LUNG HISTORY: "Do you have any history of lung disease?"  (e.g., pulmonary embolus, asthma, emphysema)     no 9. PE RISK FACTORS: "Do you have a history of blood clots?" (or: recent major surgery, recent prolonged travel, bedridden)     no 10. OTHER SYMPTOMS: "Do you have any other symptoms?" (e.g., runny nose, wheezing, chest pain)       Fatigue from cough 11. PREGNANCY: "Is there any chance you are pregnant?" "When was your last menstrual period?"       *No Answer* 12. TRAVEL: "Have you traveled out of the country in the last month?" (e.g., travel history, exposures)       Works at daycare  Protocols used: Cough - Acute Non-Productive-A-AH

## 2021-08-25 NOTE — Telephone Encounter (Signed)
Summary: pt not better/still coughing   Pt called yesterday b/c she is still coughing and has had bronchitis symptoms since Nov.  She wanted a refill of prednisone to see if that would help.  The agent sent a refill request to [facility] for the pt.  Pt called back this am to see if the refill had been done.  It is my understanding this should be a clinical call. Pt coughed and coughed while talking to me, so she clearly is not better.  No appts at [facility].       Attempted to call patient- left message to call office

## 2021-08-25 NOTE — Telephone Encounter (Signed)
She needs office visit or go to Urgent Care.

## 2021-08-25 NOTE — Telephone Encounter (Signed)
There is an opening on Hannah [name] scheduled Friday 08/26/2021 at 2:20pm. I saved this appointment to offer to patient. I Tried calling patient. Left message to call back. OK for [facility] triage to advise and confirm that patient is able to come into the office at that date and time.

## 2021-08-25 NOTE — Telephone Encounter (Signed)
Pt called back communicated message to the patient regarding appt for tomorrow. Pt expressed understanding.

## 2021-08-26 ENCOUNTER — Ambulatory Visit (INDEPENDENT_AMBULATORY_CARE_PROVIDER_SITE_OTHER): Payer: Medicare HMO | Admitting: Physician Assistant

## 2021-08-26 ENCOUNTER — Other Ambulatory Visit: Payer: Self-pay

## 2021-08-26 ENCOUNTER — Encounter: Payer: Self-pay | Admitting: Physician Assistant

## 2021-08-26 VITALS — BP 118/74 | HR 85 | Temp 98.3°F | Ht 60.0 in | Wt 143.1 lb

## 2021-08-26 DIAGNOSIS — R0981 Nasal congestion: Secondary | ICD-10-CM

## 2021-08-26 DIAGNOSIS — R053 Chronic cough: Secondary | ICD-10-CM | POA: Diagnosis not present

## 2021-08-26 MED ORDER — FLUTICASONE PROPIONATE 50 MCG/ACT NA SUSP: 2.0000 | Freq: Every day | NASAL | 6 refills | Status: DC

## 2021-08-26 MED ORDER — FLUTICASONE-SALMETEROL 250-50 MCG/ACT IN AEPB: 1.0000 | INHALATION_SPRAY | Freq: Two times a day (BID) | RESPIRATORY_TRACT | 1 refills | Status: DC

## 2021-08-26 NOTE — Progress Notes (Signed)
Established patient visit   Patient: Hannah [name]   DOB: [birth year]   71 y.o. Female  MRN: [id number] Visit Date: 08/26/2021  Today's healthcare provider: Mikey Kirschner, PA-C   Cc. Chronic cough  Subjective     Hannah [name] is a 71 y/o female who presents today with continued cough, fatigue, nasal congestion, headaches. Reports cough as dry.  Was treated previously w/ doxycycline and prednisone. Reports feeling much improved on prednisone but within the week after finishing the medication, she started to feel worse again. Never picked up albuterol inhaler d/t cost. Cough is the most bothersome at night and after a deep breath. Feels she often wheezes. Denies fevers, nausea, vomiting, diarrhea.    Medications: Outpatient Medications Prior to Visit  Medication Sig   albuterol (VENTOLIN HFA) 108 (90 Base) MCG/ACT inhaler Inhale 2 puffs into the lungs every 6 (six) hours as needed for wheezing or shortness of breath.   carvedilol (COREG) 12.5 MG tablet TAKE 1 TABLET BY MOUTH TWICE A DAY   cetirizine (ZYRTEC) 10 MG tablet Take 1 tablet (10 mg total) by mouth daily.   Cholecalciferol (VITAMIN D3) 1000 UNITS CAPS Take 1 capsule by mouth daily.    cyanocobalamin 1000 MCG tablet Take 1,000 mcg by mouth daily.   FLUoxetine (PROZAC) 40 MG capsule TAKE 1 CAPSULE BY MOUTH EVERY DAY   glipiZIDE (GLUCOTROL XL) 5 MG 24 hr tablet TAKE 1 TABLET BY MOUTH EVERY DAY WITH BREAKFAST   metFORMIN (GLUCOPHAGE) 1000 MG tablet TAKE 1 TABLET BY MOUTH TWICE A DAY FOR DIABETES   montelukast (SINGULAIR) 10 MG tablet Take 1 tablet (10 mg total) by mouth at bedtime.   omeprazole (PRILOSEC) 20 MG capsule TAKE 1 CAPSULE BY MOUTH EVERY DAY   simvastatin (ZOCOR) 40 MG tablet TAKE 1 TABLET BY MOUTH EVERYDAY AT BEDTIME   valsartan-hydrochlorothiazide (DIOVAN-HCT) 80-12.5 MG tablet Take 1 tablet by mouth daily.   [DISCONTINUED] fluticasone (FLONASE) 50 MCG/ACT nasal spray Place 2 sprays into both nostrils daily.    predniSONE (DELTASONE) 20 MG tablet Take 1 tablet (20 mg total) by mouth daily with breakfast. (Patient not taking: Reported on 08/26/2021)   No facility-administered medications prior to visit.    Review of Systems  Constitutional:  Positive for fatigue. Negative for fever.  HENT:  Positive for congestion.   Respiratory:  Positive for cough, chest tightness, shortness of breath and wheezing.   Cardiovascular:  Negative for chest pain.  Neurological:  Positive for headaches. Negative for dizziness and light-headedness.      Objective    BP 118/74 (BP Location: Left Arm, Patient Position: Sitting, Cuff Size: Normal)    Pulse 85    Temp 98.3 F (36.8 C) (Oral)    Ht 5' (1.524 m)    Wt 143 lb 1.6 oz (64.9 kg)    LMP 08/30/2003    SpO2 98%    BMI 27.95 kg/m  Blood pressure 118/74, pulse 85, temperature 98.3 F (36.8 C), temperature source Oral, height 5' (1.524 m), weight 143 lb 1.6 oz (64.9 kg), last menstrual period 08/30/2003, SpO2 98 %.   Physical Exam Constitutional:      General: She is awake.     Appearance: She is well-developed.  HENT:     Head: Normocephalic.  Eyes:     Conjunctiva/sclera: Conjunctivae normal.  Cardiovascular:     Rate and Rhythm: Normal rate and regular rhythm.     Heart sounds: Normal heart sounds.  Pulmonary:  Effort: Pulmonary effort is normal. No tachypnea.     Breath sounds: Wheezing present. No rhonchi or rales.  Skin:    General: Skin is warm.  Neurological:     Mental Status: She is alert and oriented to person, place, and time.  Psychiatric:        Attention and Perception: Attention normal.        Mood and Affect: Mood normal.        Speech: Speech normal.        Behavior: Behavior is cooperative.     No results found for any visits on 08/26/21.  Assessment & Plan     Problem List Items Addressed This Visit       Other   Persistent cough - Primary    Afebrile, not tachy, o2 98% Would like pt to try combination ICS/LABA  inhaler to see if this improves her symptoms. Advised to call office right away if not covered by insurance and we will try another Refilled flonase If no improvement, please call office.  Monitor for fevers, chills, peripheral edea.  For headaches tylenol otc      Relevant Medications   fluticasone-salmeterol (ADVAIR DISKUS) 250-50 MCG/ACT AEPB   Other Visit Diagnoses     Nasal congestion       Relevant Medications   fluticasone (FLONASE) 50 MCG/ACT nasal spray        Return if symptoms worsen or fail to improve.      I, Mikey Kirschner, PA-C have reviewed all documentation for this visit. The documentation on  08/26/2021  for the exam, diagnosis, procedures, and orders are all accurate and complete.    Mikey Kirschner, PA-C  [facility] [phone] (phone) [phone] (fax)  [place]

## 2021-08-26 NOTE — Assessment & Plan Note (Addendum)
Afebrile, not tachy, o2 98% Would like pt to try combination ICS/LABA inhaler to see if this improves her symptoms. Advised to call office right away if not covered by insurance and we will try another Refilled flonase If no improvement, please call office.  Monitor for fevers, chills, peripheral edea.  For headaches tylenol otc

## 2021-09-17 ENCOUNTER — Other Ambulatory Visit: Payer: Self-pay | Admitting: Family Medicine

## 2021-10-15 ENCOUNTER — Other Ambulatory Visit: Payer: Self-pay | Admitting: Physician Assistant

## 2021-10-15 DIAGNOSIS — R053 Chronic cough: Secondary | ICD-10-CM

## 2021-10-22 ENCOUNTER — Other Ambulatory Visit: Payer: Self-pay | Admitting: Family Medicine

## 2021-12-01 NOTE — Progress Notes (Signed)
Established patient visit  I,Tiffany J Bragg,acting as a scribe for Lelon Huh, MD.,have documented all relevant documentation on the behalf of Lelon Huh, MD,as directed by  Lelon Huh, MD while in the presence of Lelon Huh, MD.   Patient: Hannah [name]   DOB: [birth year]   71 y.o. Female  MRN: [id number] Visit Date: 12/02/2021  Today's healthcare provider: Lelon Huh, MD   Chief Complaint  Patient presents with   Hypertension   Diabetes   Subjective    HPI  Hypertension, follow-up  BP Readings from Last 3 Encounters:  12/02/21 107/69  08/26/21 118/74  07/22/21 124/71   Wt Readings from Last 3 Encounters:  12/02/21 148 lb 4.8 oz (67.3 kg)  08/26/21 143 lb 1.6 oz (64.9 kg)  07/22/21 145 lb 3.2 oz (65.9 kg)     She was last seen for hypertension 9 months ago.  BP at that visit was 97/65. Management since that visit includes continue same medication.  She reports excellent compliance with treatment. She is not having side effects.  She is following a  poor  diet. She is exercising. She does not smoke.  Use of agents associated with hypertension: none.   Outside blood pressures are not checked. Symptoms: No chest pain No chest pressure  No palpitations No syncope  No dyspnea Yes orthopnea  No paroxysmal nocturnal dyspnea No lower extremity edema   Pertinent labs Lab Results  Component Value Date   NA 138 02/15/2021   K 4.8 02/15/2021   CREATININE 0.82 02/15/2021   EGFR 77 02/15/2021   GLUCOSE 165 (H) 02/15/2021   TSH 1.630 01/14/2020     The 10-year ASCVD risk score (Arnett DK, et al., 2019) is: 16.7%  ---------------------------------------------------------------------------------------------------   Diabetes Mellitus Type II, Follow-up  Lab Results  Component Value Date   HGBA1C 7.4 (H) 02/15/2021   HGBA1C 7.3 (A) 08/24/2020   HGBA1C 7.9 (H) 05/17/2020   Wt Readings from Last 3 Encounters:  12/02/21 148 lb 4.8 oz (67.3 kg)   08/26/21 143 lb 1.6 oz (64.9 kg)  07/22/21 145 lb 3.2 oz (65.9 kg)   Last seen for diabetes 9 months ago.  Management since then includes continue same medication. She reports excellent compliance with treatment. She is not having side effects.  Symptoms: No fatigue No foot ulcerations  No appetite changes No nausea  No paresthesia of the feet  No polydipsia  No polyuria No visual disturbances   No vomiting     Home blood sugar records: are not checked  Episodes of hypoglycemia? Yes last episode was 9 months ago.   Current insulin regiment: none Most Recent Eye Exam: 03/17/2021 Current exercise: walking Current diet habits: poor  Pertinent Labs: Lab Results  Component Value Date   CHOL 161 02/15/2021   HDL 76 02/15/2021   LDLCALC 67 02/15/2021   LDLDIRECT 74.0 02/07/2016   TRIG 101 02/15/2021   CHOLHDL 2.1 02/15/2021   Lab Results  Component Value Date   NA 138 02/15/2021   K 4.8 02/15/2021   CREATININE 0.82 02/15/2021   EGFR 77 02/15/2021   MICROALBUR 1.5 02/07/2016     ---------------------------------------------------------------------------------------------------   Medications: Outpatient Medications Prior to Visit  Medication Sig   ADVAIR DISKUS 250-50 MCG/ACT AEPB INHALE 1 PUFF INTO THE LUNGS IN THE MORNING AND AT BEDTIME.   albuterol (VENTOLIN HFA) 108 (90 Base) MCG/ACT inhaler Inhale 2 puffs into the lungs every 6 (six) hours as needed for wheezing or shortness  of breath.   carvedilol (COREG) 12.5 MG tablet TAKE 1 TABLET BY MOUTH TWICE A DAY   cetirizine (ZYRTEC) 10 MG tablet Take 1 tablet (10 mg total) by mouth daily.   Cholecalciferol (VITAMIN D3) 1000 UNITS CAPS Take 1 capsule by mouth daily.    cyanocobalamin 1000 MCG tablet Take 1,000 mcg by mouth daily.   FLUoxetine (PROZAC) 40 MG capsule TAKE 1 CAPSULE BY MOUTH EVERY DAY   fluticasone (FLONASE) 50 MCG/ACT nasal spray Place 2 sprays into both nostrils daily.   glipiZIDE (GLUCOTROL XL) 5 MG 24  hr tablet TAKE 1 TABLET BY MOUTH EVERY DAY WITH BREAKFAST   metFORMIN (GLUCOPHAGE) 1000 MG tablet TAKE 1 TABLET BY MOUTH TWICE A DAY FOR DIABETES   montelukast (SINGULAIR) 10 MG tablet TAKE 1 TABLET BY MOUTH EVERYDAY AT BEDTIME   omeprazole (PRILOSEC) 20 MG capsule TAKE 1 CAPSULE BY MOUTH EVERY DAY   predniSONE (DELTASONE) 20 MG tablet Take 1 tablet (20 mg total) by mouth daily with breakfast.   simvastatin (ZOCOR) 40 MG tablet TAKE 1 TABLET BY MOUTH EVERYDAY AT BEDTIME   valsartan-hydrochlorothiazide (DIOVAN-HCT) 80-12.5 MG tablet Take 1 tablet by mouth daily.   No facility-administered medications prior to visit.    Review of Systems  Constitutional:  Negative for appetite change, chills, fatigue and fever.  Respiratory:  Negative for chest tightness and shortness of breath.   Cardiovascular:  Negative for chest pain and palpitations.  Gastrointestinal:  Negative for abdominal pain, nausea and vomiting.  Neurological:  Negative for dizziness and weakness.      Objective    BP 107/69 (BP Location: Left Arm, Patient Position: Sitting, Cuff Size: Normal)   Pulse 87   Temp 98.5 F (36.9 C)   Resp 16   Ht 5' (1.524 m)   Wt 148 lb 4.8 oz (67.3 kg)   LMP 08/30/2003   SpO2 97%   BMI 28.96 kg/m    Physical Exam    General: Appearance:     Well developed, well nourished female in no acute distress  Eyes:    PERRL, conjunctiva/corneas clear, EOM's intact       Lungs:     Clear to auscultation bilaterally, respirations unlabored  Heart:    Normal heart rate. Normal rhythm. No murmurs, rubs, or gallops.    MS:   All extremities are intact.    Neurologic:   Awake, alert, oriented x 3. No apparent focal neurological defect.        Results for orders placed or performed in visit on 12/02/21  POCT HgB A1C  Result Value Ref Range   Hemoglobin A1C 8.2 (A) 4.0 - 5.6 %   Est. average glucose Bld gHb Est-mCnc 189      Assessment & Plan     1. Type 2 diabetes mellitus without  complication, without long-term current use of insulin (HCC) A1c is creeping up. She is going to work on eating healthier and staying more active. Continue current medications.    - Urine Albumin-Creatinine with uACR  2. Primary hypertension Is getting a bit hypotensice, reduce valsartan-hydrochlorothiazide (DIOVAN-HCT) 80-12.5 MG tablet to 0.5 tablets by mouth daily.  3. Hyperlipidemia, unspecified hyperlipidemia type She is tolerating simvastatin well with no adverse effects.   - Lipid panel - Renal function panel   Future Appointments  Date Time Provider [place]  05/03/2022  8:00 AM Caryn Section, Kirstie Peri, MD [facility]        The entirety of the information documented in the  History of Present Illness, Review of Systems and Physical Exam were personally obtained by me. Portions of this information were initially documented by the CMA and reviewed by me for thoroughness and accuracy.     Lelon Huh, MD  [facility] [phone] (phone) [phone] (fax)  [place]

## 2021-12-02 ENCOUNTER — Ambulatory Visit (INDEPENDENT_AMBULATORY_CARE_PROVIDER_SITE_OTHER): Payer: Medicare HMO | Admitting: Family Medicine

## 2021-12-02 ENCOUNTER — Encounter: Payer: Self-pay | Admitting: Family Medicine

## 2021-12-02 VITALS — BP 107/69 | HR 87 | Temp 98.5°F | Resp 16 | Ht 60.0 in | Wt 148.3 lb

## 2021-12-02 DIAGNOSIS — E119 Type 2 diabetes mellitus without complications: Secondary | ICD-10-CM | POA: Diagnosis not present

## 2021-12-02 DIAGNOSIS — I10 Essential (primary) hypertension: Secondary | ICD-10-CM | POA: Diagnosis not present

## 2021-12-02 DIAGNOSIS — E785 Hyperlipidemia, unspecified: Secondary | ICD-10-CM

## 2021-12-02 LAB — POCT GLYCOSYLATED HEMOGLOBIN (HGB A1C)
Est. average glucose Bld gHb Est-mCnc: 189
Hemoglobin A1C: 8.2 % — AB (ref 4.0–5.6)

## 2021-12-02 MED ORDER — VALSARTAN-HYDROCHLOROTHIAZIDE 80-12.5 MG PO TABS: 0.5000 | ORAL_TABLET | Freq: Every day | ORAL | Status: DC

## 2021-12-02 NOTE — Patient Instructions (Signed)
Please review the attached list of medications and notify my office if there are any errors.  ? ?Reduce valsartan-hctz to 1/2 tablet every day ?

## 2021-12-04 LAB — MICROALBUMIN / CREATININE URINE RATIO
Creatinine, Urine: 171.4 mg/dL
Microalb/Creat Ratio: 5 mg/g creat (ref 0–29)
Microalbumin, Urine: 9.3 ug/mL

## 2021-12-05 DIAGNOSIS — E785 Hyperlipidemia, unspecified: Secondary | ICD-10-CM | POA: Diagnosis not present

## 2021-12-06 LAB — LIPID PANEL
Chol/HDL Ratio: 2.4 ratio (ref 0.0–4.4)
Cholesterol, Total: 179 mg/dL (ref 100–199)
HDL: 74 mg/dL (ref >39)
LDL Chol Calc (NIH): 84 mg/dL (ref 0–99)
Triglycerides: 123 mg/dL (ref 0–149)
VLDL Cholesterol Cal: 21 mg/dL (ref 5–40)

## 2021-12-06 LAB — RENAL FUNCTION PANEL
Albumin: 4.3 g/dL (ref 3.7–4.7)
BUN/Creatinine Ratio: 17 (ref 12–28)
BUN: 15 mg/dL (ref 8–27)
CO2: 22 mmol/L (ref 20–29)
Calcium: 9.6 mg/dL (ref 8.7–10.3)
Chloride: 98 mmol/L (ref 96–106)
Creatinine, Ser: 0.88 mg/dL (ref 0.57–1.00)
Glucose: 186 mg/dL — ABNORMAL HIGH (ref 70–99)
Phosphorus: 4 mg/dL (ref 3.0–4.3)
Potassium: 4.6 mmol/L (ref 3.5–5.2)
Sodium: 137 mmol/L (ref 134–144)
eGFR: 70 mL/min/{1.73_m2} (ref >59)

## 2021-12-12 ENCOUNTER — Other Ambulatory Visit: Payer: Self-pay | Admitting: Family Medicine

## 2021-12-12 DIAGNOSIS — I10 Essential (primary) hypertension: Secondary | ICD-10-CM

## 2021-12-19 ENCOUNTER — Ambulatory Visit: Payer: Self-pay

## 2021-12-19 NOTE — Telephone Encounter (Signed)
  Chief Complaint: bi lateral foot pain Symptoms: ibid Frequency: 1 week Pertinent Negatives: Patient denies fever, redness swelling Disposition: '[]'$ ED /'[]'$ Urgent Care (no appt availability in office) / '[x]'$ Appointment(In office/virtual)/ '[]'$  [place] [facility]/ '[]'$ Home Care/ '[]'$ Refused Recommended Disposition /'[]'$ [place] [facility]/ '[]'$  Follow-up with PCP Additional Notes: PT rejects sooner appointment d/t work. Pt has appt on Tuesday. Reason for Disposition  [1] MODERATE pain (e.g., interferes with normal activities, limping) AND [2] present > 3 days  Answer Assessment - Initial Assessment Questions 1. ONSET: "When did the pain start?"      1 week 2. LOCATION: "Where is the pain located?"      Both feet  - mostly right 3. PAIN: "How bad is the pain?"    (Scale 1-10; or mild, moderate, severe)  - MILD (1-3): doesn't interfere with normal activities.   - MODERATE (4-7): interferes with normal activities (e.g., work or school) or awakens from sleep, limping.   - SEVERE (8-10): excruciating pain, unable to do any normal activities, unable to walk.      5/10 4. WORK OR EXERCISE: "Has there been any recent work or exercise that involved this part of the body?"      no 5. CAUSE: "What do you think is causing the foot pain?"     unknown 6. OTHER SYMPTOMS: "Do you have any other symptoms?" (e.g., leg pain, rash, fever, numbness)     no 7. PREGNANCY: "Is there any chance you are pregnant?" "When was your last menstrual period?"     na  Protocols used: Foot Pain-A-AH

## 2021-12-27 ENCOUNTER — Ambulatory Visit
Admission: RE | Admit: 2021-12-27 | Discharge: 2021-12-27 | Disposition: A | Discharge status: Home / Self Care | Payer: Medicare HMO | Attending: Family Medicine | Admitting: Family Medicine

## 2021-12-27 ENCOUNTER — Ambulatory Visit (INDEPENDENT_AMBULATORY_CARE_PROVIDER_SITE_OTHER): Payer: Medicare HMO | Admitting: Family Medicine

## 2021-12-27 ENCOUNTER — Ambulatory Visit
Admission: RE | Admit: 2021-12-27 | Discharge: 2021-12-27 | Disposition: A | Discharge status: Home / Self Care | Payer: Medicare HMO | Source: Ambulatory Visit | Attending: Family Medicine | Admitting: Family Medicine

## 2021-12-27 ENCOUNTER — Encounter: Payer: Self-pay | Admitting: Family Medicine

## 2021-12-27 VITALS — BP 108/73 | HR 79 | Temp 98.4°F | Resp 16 | Ht 60.0 in | Wt 148.7 lb

## 2021-12-27 DIAGNOSIS — M5442 Lumbago with sciatica, left side: Secondary | ICD-10-CM | POA: Insufficient documentation

## 2021-12-27 DIAGNOSIS — I739 Peripheral vascular disease, unspecified: Secondary | ICD-10-CM

## 2021-12-27 DIAGNOSIS — M47817 Spondylosis without myelopathy or radiculopathy, lumbosacral region: Secondary | ICD-10-CM | POA: Insufficient documentation

## 2021-12-27 DIAGNOSIS — M5441 Lumbago with sciatica, right side: Secondary | ICD-10-CM | POA: Insufficient documentation

## 2021-12-27 DIAGNOSIS — M47816 Spondylosis without myelopathy or radiculopathy, lumbar region: Secondary | ICD-10-CM | POA: Insufficient documentation

## 2021-12-27 DIAGNOSIS — G8929 Other chronic pain: Secondary | ICD-10-CM | POA: Insufficient documentation

## 2021-12-27 DIAGNOSIS — M545 Low back pain, unspecified: Secondary | ICD-10-CM | POA: Diagnosis not present

## 2021-12-27 MED ORDER — GABAPENTIN 600 MG PO TABS: 600.0000 mg | ORAL_TABLET | Freq: Every day | ORAL | 2 refills | Status: DC

## 2021-12-27 NOTE — Progress Notes (Deleted)
Established patient visit   Patient: Hannah [name]   DOB: September 04, 1950   71 y.o. Female  MRN: [id number] Visit Date: 12/27/2021  Today's healthcare provider: Gwyneth Sprout, FNP   I,Malaka Ruffner J Chaniqua Brisby,acting as a scribe for Gwyneth Sprout, FNP.,have documented all relevant documentation on the behalf of Gwyneth Sprout, FNP,as directed by  Gwyneth Sprout, FNP while in the presence of Gwyneth Sprout, FNP.   Chief Complaint  Patient presents with   Pain    Patient complains of R foot hurting for the past couple weeks.    Subjective    HPI HPI     Pain    Additional comments: Patient complains of R foot hurting for the past couple weeks.       Last edited by Smitty Knudsen, CMA on 12/27/2021 10:37 AM.      Pain  She reports recurrent R foot pain. was not an injury that may have caused the pain. The pain started a few weeks ago and is staying constant. The pain does not radiate . The pain is described as burning and tingling, is 6/10 in intensity, occurring intermittently. Symptoms are worse in the: evening  Aggravating factors: walking Relieving factors: medication advil .  She has tried NSAIDs with little relief.   ---------------------------------------------------------------------------------------------------   Medications: Outpatient Medications Prior to Visit  Medication Sig   ADVAIR DISKUS 250-50 MCG/ACT AEPB INHALE 1 PUFF INTO THE LUNGS IN THE MORNING AND AT BEDTIME.   albuterol (VENTOLIN HFA) 108 (90 Base) MCG/ACT inhaler Inhale 2 puffs into the lungs every 6 (six) hours as needed for wheezing or shortness of breath.   carvedilol (COREG) 12.5 MG tablet TAKE 1 TABLET BY MOUTH TWICE A DAY   cetirizine (ZYRTEC) 10 MG tablet Take 1 tablet (10 mg total) by mouth daily.   Cholecalciferol (VITAMIN D3) 1000 UNITS CAPS Take 1 capsule by mouth daily.    cyanocobalamin 1000 MCG tablet Take 1,000 mcg by mouth daily.   FLUoxetine (PROZAC) 40 MG capsule TAKE 1 CAPSULE BY MOUTH  EVERY DAY   fluticasone (FLONASE) 50 MCG/ACT nasal spray Place 2 sprays into both nostrils daily.   glipiZIDE (GLUCOTROL XL) 5 MG 24 hr tablet TAKE 1 TABLET BY MOUTH EVERY DAY WITH BREAKFAST   metFORMIN (GLUCOPHAGE) 1000 MG tablet TAKE 1 TABLET BY MOUTH TWICE A DAY FOR DIABETES   montelukast (SINGULAIR) 10 MG tablet TAKE 1 TABLET BY MOUTH EVERYDAY AT BEDTIME   omeprazole (PRILOSEC) 20 MG capsule TAKE 1 CAPSULE BY MOUTH EVERY DAY   simvastatin (ZOCOR) 40 MG tablet TAKE 1 TABLET BY MOUTH EVERYDAY AT BEDTIME   valsartan-hydrochlorothiazide (DIOVAN-HCT) 80-12.5 MG tablet TAKE 1 TABLET BY MOUTH EVERY DAY   No facility-administered medications prior to visit.    Review of Systems  {Labs  Heme  Chem  Endocrine  Serology  Results Review (optional):23779}   Objective    BP 108/73 (BP Location: Left Arm, Patient Position: Sitting, Cuff Size: Normal)   Pulse 79   Temp 98.4 F (36.9 C) (Oral)   Resp 16   Ht 5' (1.524 m)   Wt 148 lb 11.2 oz (67.4 kg)   LMP 08/30/2003   SpO2 94%   BMI 29.04 kg/m  {Show previous vital signs (optional):23777}  Physical Exam  ***  No results found for any visits on 12/27/21.  Assessment & Plan     ***  No follow-ups on file.      {provider attestation***:1}  Gwyneth Sprout, [place] [phone] (phone) [phone] (fax)  [place]

## 2021-12-27 NOTE — Assessment & Plan Note (Signed)
Complaints of bilateral buttocks/low back pain and sacral pain Notes fall 50 years ago Will send for lumbar xray Intermittent sciatica Encourage f/u with PCP

## 2021-12-27 NOTE — Progress Notes (Deleted)
Established patient visit   Patient: Hannah [name]   DOB: 13-May-1951   71 y.o. Female  MRN: [id number] Visit Date: 12/27/2021  Today's healthcare provider: Gwyneth Sprout, FNP  Re Introduced to nurse practitioner role and practice setting.  All questions answered.  Discussed provider/patient relationship and expectations.   I,Tiffany J Bragg,acting as a scribe for Gwyneth Sprout, FNP.,have documented all relevant documentation on the behalf of Gwyneth Sprout, FNP,as directed by  Gwyneth Sprout, FNP while in the presence of Gwyneth Sprout, FNP.   Chief Complaint  Patient presents with   Pain    Patient complains of R foot hurting for the past couple weeks.    Subjective    HPI HPI     Pain    Additional comments: Patient complains of R foot hurting for the past couple weeks.       Last edited by Smitty Knudsen, CMA on 12/27/2021 10:37 AM.      Pain  She reports recurrent R foot pain. was not an injury that may have caused the pain. The pain started a few weeks ago and is staying constant. The pain does not radiate . The pain is described as burning and tingling, is 6/10 in intensity, occurring intermittently. Symptoms are worse in the: evening  Aggravating factors: walking Relieving factors: medication advil .  She has tried NSAIDs with little relief.   ---------------------------------------------------------------------------------------------------   Medications: Outpatient Medications Prior to Visit  Medication Sig   ADVAIR DISKUS 250-50 MCG/ACT AEPB INHALE 1 PUFF INTO THE LUNGS IN THE MORNING AND AT BEDTIME.   albuterol (VENTOLIN HFA) 108 (90 Base) MCG/ACT inhaler Inhale 2 puffs into the lungs every 6 (six) hours as needed for wheezing or shortness of breath.   carvedilol (COREG) 12.5 MG tablet TAKE 1 TABLET BY MOUTH TWICE A DAY   cetirizine (ZYRTEC) 10 MG tablet Take 1 tablet (10 mg total) by mouth daily.   Cholecalciferol (VITAMIN D3) 1000 UNITS CAPS Take 1  capsule by mouth daily.    cyanocobalamin 1000 MCG tablet Take 1,000 mcg by mouth daily.   FLUoxetine (PROZAC) 40 MG capsule TAKE 1 CAPSULE BY MOUTH EVERY DAY   fluticasone (FLONASE) 50 MCG/ACT nasal spray Place 2 sprays into both nostrils daily.   glipiZIDE (GLUCOTROL XL) 5 MG 24 hr tablet TAKE 1 TABLET BY MOUTH EVERY DAY WITH BREAKFAST   metFORMIN (GLUCOPHAGE) 1000 MG tablet TAKE 1 TABLET BY MOUTH TWICE A DAY FOR DIABETES   montelukast (SINGULAIR) 10 MG tablet TAKE 1 TABLET BY MOUTH EVERYDAY AT BEDTIME   omeprazole (PRILOSEC) 20 MG capsule TAKE 1 CAPSULE BY MOUTH EVERY DAY   simvastatin (ZOCOR) 40 MG tablet TAKE 1 TABLET BY MOUTH EVERYDAY AT BEDTIME   valsartan-hydrochlorothiazide (DIOVAN-HCT) 80-12.5 MG tablet TAKE 1 TABLET BY MOUTH EVERY DAY   No facility-administered medications prior to visit.    Review of Systems     Objective    BP 108/73 (BP Location: Left Arm, Patient Position: Sitting, Cuff Size: Normal)   Pulse 79   Temp 98.4 F (36.9 C) (Oral)   Resp 16   Ht 5' (1.524 m)   Wt 148 lb 11.2 oz (67.4 kg)   LMP 08/30/2003   SpO2 94%   BMI 29.04 kg/m    Physical Exam Vitals and nursing note reviewed.  Constitutional:      General: She is not in acute distress.    Appearance: Normal appearance. She is overweight.  She is not ill-appearing, toxic-appearing or diaphoretic.  HENT:     Head: Normocephalic and atraumatic.  Cardiovascular:     Rate and Rhythm: Normal rate and regular rhythm.     Pulses:          Dorsalis pedis pulses are 1+ on the right side.       Posterior tibial pulses are 1+ on the right side.     Heart sounds: Normal heart sounds. No murmur heard.   No friction rub. No gallop.  Pulmonary:     Effort: Pulmonary effort is normal. No respiratory distress.     Breath sounds: Normal breath sounds. No stridor. No wheezing, rhonchi or rales.  Chest:     Chest wall: No tenderness.  Abdominal:     General: Bowel sounds are normal.     Palpations:  Abdomen is soft.  Musculoskeletal:        General: Tenderness present. No swelling, deformity or signs of injury. Normal range of motion.     Right lower leg: No edema.     Left lower leg: No edema.       Legs:     Comments: Area of tenderness; noted fall of ski lift when she was in her 20's  Feet:     Right foot:     Skin integrity: Skin integrity normal.     Toenail Condition: Right toenails are normal.  Skin:    General: Skin is warm and dry.     Capillary Refill: Capillary refill takes less than 2 seconds.     Coloration: Skin is not jaundiced or pale.     Findings: No bruising, erythema, lesion or rash.  Neurological:     General: No focal deficit present.     Mental Status: She is alert and oriented to person, place, and time. Mental status is at baseline.     Cranial Nerves: No cranial nerve deficit.     Sensory: No sensory deficit.     Motor: No weakness.     Coordination: Coordination normal.  Psychiatric:        Mood and Affect: Mood normal.        Behavior: Behavior normal.        Thought Content: Thought content normal.        Judgment: Judgment normal.     No results found for any visits on 12/27/21.  Assessment & Plan     Problem List Items Addressed This Visit       Cardiovascular and Mediastinum   PVD (peripheral vascular disease) ([place]) - Primary    Presumed, recommend f/u with vascular with ABI and doppler Complaints of burning/tingling pain that is rated as 6/10 and is intermitted, it is worse with activity Pt has hx of DM, is OW Body mass index is 29.04 kg/m. has HTN +1 pulses noted; norm Controls pain with "advil" Self diagnosed with "restless leg"  Referrals placed to vascular Will start gabapentin to decrease use of NSAID use         Relevant Medications   gabapentin (NEURONTIN) 600 MG tablet   Other Relevant Orders   Ambulatory referral to Vascular Surgery     Nervous and Auditory   Chronic bilateral low back pain with bilateral  sciatica    Complaints of bilateral buttocks/low back pain and sacral pain Notes fall 50 years ago Will send for lumbar xray Intermittent sciatica Encourage f/u with PCP       Relevant Medications   gabapentin (NEURONTIN)  600 MG tablet   Other Relevant Orders   DG Lumbar Spine Complete     Return in about 4 weeks (around 01/24/2022), or if symptoms worsen or fail to improve.     Vonna Kotyk, FNP, have reviewed all documentation for this visit. The documentation on 12/27/21 for the exam, diagnosis, procedures, and orders are all accurate and complete.    Gwyneth Sprout, [place] [phone] (phone) [phone] (fax)  [place]

## 2021-12-27 NOTE — Progress Notes (Signed)
Established patient visit   Patient: Hannah [name]   DOB: 10-25-1950   71 y.o. Female  MRN: [id number] Visit Date: 12/27/2021  Today's healthcare provider: Gwyneth Sprout, FNP  Re Introduced to nurse practitioner role and practice setting.  All questions answered.  Discussed provider/patient relationship and expectations.   I,Tiffany J Bragg,acting as a scribe for Gwyneth Sprout, FNP.,have documented all relevant documentation on the behalf of Gwyneth Sprout, FNP,as directed by  Gwyneth Sprout, FNP while in the presence of Gwyneth Sprout, FNP.   Chief Complaint  Patient presents with   Pain    Patient complains of R foot hurting for the past couple weeks.    Subjective    HPI HPI     Pain    Additional comments: Patient complains of R foot hurting for the past couple weeks.       Last edited by Smitty Knudsen, CMA on 12/27/2021 10:37 AM.      Pain  She reports recurrent R foot pain. was not an injury that may have caused the pain. The pain started a few weeks ago and is staying constant. The pain does not radiate . The pain is described as burning and tingling, is 6/10 in intensity, occurring intermittently. Symptoms are worse in the: evening  Aggravating factors: walking Relieving factors: medication advil.  She has tried NSAIDs with little relief.   ---------------------------------------------------------------------------------------------------   Medications: Outpatient Medications Prior to Visit  Medication Sig   ADVAIR DISKUS 250-50 MCG/ACT AEPB INHALE 1 PUFF INTO THE LUNGS IN THE MORNING AND AT BEDTIME.   albuterol (VENTOLIN HFA) 108 (90 Base) MCG/ACT inhaler Inhale 2 puffs into the lungs every 6 (six) hours as needed for wheezing or shortness of breath.   carvedilol (COREG) 12.5 MG tablet TAKE 1 TABLET BY MOUTH TWICE A DAY   cetirizine (ZYRTEC) 10 MG tablet Take 1 tablet (10 mg total) by mouth daily.   Cholecalciferol (VITAMIN D3) 1000 UNITS CAPS Take 1  capsule by mouth daily.    cyanocobalamin 1000 MCG tablet Take 1,000 mcg by mouth daily.   FLUoxetine (PROZAC) 40 MG capsule TAKE 1 CAPSULE BY MOUTH EVERY DAY   fluticasone (FLONASE) 50 MCG/ACT nasal spray Place 2 sprays into both nostrils daily.   glipiZIDE (GLUCOTROL XL) 5 MG 24 hr tablet TAKE 1 TABLET BY MOUTH EVERY DAY WITH BREAKFAST   metFORMIN (GLUCOPHAGE) 1000 MG tablet TAKE 1 TABLET BY MOUTH TWICE A DAY FOR DIABETES   montelukast (SINGULAIR) 10 MG tablet TAKE 1 TABLET BY MOUTH EVERYDAY AT BEDTIME   omeprazole (PRILOSEC) 20 MG capsule TAKE 1 CAPSULE BY MOUTH EVERY DAY   simvastatin (ZOCOR) 40 MG tablet TAKE 1 TABLET BY MOUTH EVERYDAY AT BEDTIME   valsartan-hydrochlorothiazide (DIOVAN-HCT) 80-12.5 MG tablet TAKE 1 TABLET BY MOUTH EVERY DAY   No facility-administered medications prior to visit.    Review of Systems     Objective    BP 108/73 (BP Location: Left Arm, Patient Position: Sitting, Cuff Size: Normal)   Pulse 79   Temp 98.4 F (36.9 C) (Oral)   Resp 16   Ht 5' (1.524 m)   Wt 148 lb 11.2 oz (67.4 kg)   LMP 08/30/2003   SpO2 94%   BMI 29.04 kg/m    Physical Exam Vitals and nursing note reviewed.  Constitutional:      General: She is not in acute distress.    Appearance: Normal appearance. She is overweight. She  is not ill-appearing, toxic-appearing or diaphoretic.  HENT:     Head: Normocephalic and atraumatic.  Cardiovascular:     Rate and Rhythm: Normal rate and regular rhythm.     Pulses:          Dorsalis pedis pulses are 1+ on the right side.       Posterior tibial pulses are 1+ on the right side.     Heart sounds: Normal heart sounds. No murmur heard.   No friction rub. No gallop.  Pulmonary:     Effort: Pulmonary effort is normal. No respiratory distress.     Breath sounds: Normal breath sounds. No stridor. No wheezing, rhonchi or rales.  Chest:     Chest wall: No tenderness.  Abdominal:     General: Bowel sounds are normal.     Palpations:  Abdomen is soft.  Musculoskeletal:        General: Tenderness present. No swelling, deformity or signs of injury. Normal range of motion.     Right lower leg: No edema.     Left lower leg: No edema.       Legs:     Comments: Area of tenderness; noted fall of ski lift when she was in her 20's  Feet:     Right foot:     Skin integrity: Skin integrity normal.     Toenail Condition: Right toenails are normal.  Skin:    General: Skin is warm and dry.     Capillary Refill: Capillary refill takes less than 2 seconds.     Coloration: Skin is not jaundiced or pale.     Findings: No bruising, erythema, lesion or rash.  Neurological:     General: No focal deficit present.     Mental Status: She is alert and oriented to person, place, and time. Mental status is at baseline.     Cranial Nerves: No cranial nerve deficit.     Sensory: No sensory deficit.     Motor: No weakness.     Coordination: Coordination normal.  Psychiatric:        Mood and Affect: Mood normal.        Behavior: Behavior normal.        Thought Content: Thought content normal.        Judgment: Judgment normal.     No results found for any visits on 12/27/21.  Assessment & Plan     Problem List Items Addressed This Visit       Cardiovascular and Mediastinum   PVD (peripheral vascular disease) ([place]) - Primary    Presumed, recommend f/u with vascular with ABI and doppler Complaints of burning/tingling pain that is rated as 6/10 and is intermitted, it is worse with activity Pt has hx of DM, is OW Body mass index is 29.04 kg/m. has HTN +1 pulses noted; norm Controls pain with "advil" Self diagnosed with "restless leg"  Referrals placed to vascular Will start gabapentin to decrease use of NSAID use         Relevant Medications   gabapentin (NEURONTIN) 600 MG tablet   Other Relevant Orders   Ambulatory referral to Vascular Surgery     Nervous and Auditory   Chronic bilateral low back pain with bilateral  sciatica    Complaints of bilateral buttocks/low back pain and sacral pain Notes fall 50 years ago Will send for lumbar xray Intermittent sciatica Encourage f/u with PCP       Relevant Medications   gabapentin (NEURONTIN) 600  MG tablet   Other Relevant Orders   DG Lumbar Spine Complete     Return in about 4 weeks (around 01/24/2022), or if symptoms worsen or fail to improve.     Vonna Kotyk, FNP, have reviewed all documentation for this visit. The documentation on 12/27/21 for the exam, diagnosis, procedures, and orders are all accurate and complete.    Gwyneth Sprout, [place] [phone] (phone) [phone] (fax)  [place]

## 2021-12-27 NOTE — Assessment & Plan Note (Signed)
Presumed, recommend f/u with vascular with ABI and doppler Complaints of burning/tingling pain that is rated as 6/10 and is intermitted, it is worse with activity Pt has hx of DM, is OW Body mass index is 29.04 kg/m. has HTN +1 pulses noted; norm Controls pain with "advil" Self diagnosed with "restless leg"  Referrals placed to vascular Will start gabapentin to decrease use of NSAID use

## 2022-01-11 ENCOUNTER — Other Ambulatory Visit: Payer: Self-pay | Admitting: Family Medicine

## 2022-01-12 ENCOUNTER — Other Ambulatory Visit: Payer: Self-pay | Admitting: Family Medicine

## 2022-01-12 DIAGNOSIS — Z1231 Encounter for screening mammogram for malignant neoplasm of breast: Secondary | ICD-10-CM

## 2022-01-28 ENCOUNTER — Other Ambulatory Visit: Payer: Self-pay | Admitting: Family Medicine

## 2022-01-28 DIAGNOSIS — E119 Type 2 diabetes mellitus without complications: Secondary | ICD-10-CM

## 2022-01-30 NOTE — Telephone Encounter (Signed)
Requested Prescriptions  Pending Prescriptions Disp Refills  . metFORMIN (GLUCOPHAGE) 1000 MG tablet [Pharmacy Med Name: METFORMIN HCL 1,000 MG TABLET] 180 tablet 1    Sig: TAKE 1 TABLET BY MOUTH TWICE A DAY FOR DIABETES     Endocrinology:  Diabetes - Biguanides Failed - 01/28/2022 10:37 AM      Failed - HBA1C is between 0 and 7.9 and within 180 days    Hemoglobin A1C  Date Value Ref Range Status  12/02/2021 8.2 (A) 4.0 - 5.6 % Final   Hgb A1c MFr Bld  Date Value Ref Range Status  02/15/2021 7.4 (H) 4.8 - 5.6 % Final    Comment:             Prediabetes: 5.7 - 6.4          Diabetes: >6.4          Glycemic control for adults with diabetes: <7.0          Failed - B12 Level in normal range and within 720 days    No results found for: "VITAMINB12"       Failed - CBC within normal limits and completed in the last 12 months    WBC  Date Value Ref Range Status  02/15/2021 6.6 3.4 - 10.8 x10E3/uL Final  09/17/2020 6.3 4.0 - 10.5 K/uL Final   RBC  Date Value Ref Range Status  02/15/2021 4.14 3.77 - 5.28 x10E6/uL Final  09/17/2020 4.02 3.87 - 5.11 MIL/uL Final   Hemoglobin  Date Value Ref Range Status  02/15/2021 12.1 11.1 - 15.9 g/dL Final   Hematocrit  Date Value Ref Range Status  02/15/2021 36.3 34.0 - 46.6 % Final   MCHC  Date Value Ref Range Status  02/15/2021 33.3 31.5 - 35.7 g/dL Final  09/17/2020 32.1 30.0 - 36.0 g/dL Final   [facility]  Date Value Ref Range Status  02/15/2021 29.2 26.6 - 33.0 pg Final  09/17/2020 28.6 26.0 - 34.0 pg Final   MCV  Date Value Ref Range Status  02/15/2021 88 79 - 97 fL Final  02/10/2014 91 80 - 100 fL Final   No results found for: "PLTCOUNTKUC", "LABPLAT", "POCPLA" RDW  Date Value Ref Range Status  02/15/2021 13.3 11.7 - 15.4 % Final  02/10/2014 13.2 11.5 - 14.5 % Final         Passed - Cr in normal range and within 360 days    Creat  Date Value Ref Range Status  06/13/2017 0.69 0.50 - 0.99 mg/dL Final    Comment:    For  patients >32 years of age, the reference limit for Creatinine is approximately 13% higher for people identified as African-American. .    Creatinine, Ser  Date Value Ref Range Status  12/05/2021 0.88 0.57 - 1.00 mg/dL Final   Creatinine,U  Date Value Ref Range Status  02/07/2016 188.2 mg/dL Final         Passed - eGFR in normal range and within 360 days    GFR, Est African American  Date Value Ref Range Status  06/13/2017 105 > OR = 60 mL/min/1.77m Final   GFR calc Af Amer  Date Value Ref Range Status  03/17/2020 >60 >60 mL/min Final   GFR, Est Non African American  Date Value Ref Range Status  06/13/2017 91 > OR = 60 mL/min/1.762mFinal   GFR, Estimated  Date Value Ref Range Status  09/17/2020 >60 >60 mL/min Final    Comment:    (NOTE) Calculated  using the CKD-EPI Creatinine Equation (2021)    GFR  Date Value Ref Range Status  02/07/2016 70.31 >60.00 mL/min Final   eGFR  Date Value Ref Range Status  12/05/2021 70 >59 mL/min/1.73 Final         Passed - Valid encounter within last 6 months    Recent Outpatient Visits          1 month ago PVD (peripheral vascular disease) [facility])   [facility] Tally Joe T, FNP   1 month ago Type 2 diabetes mellitus without complication, without long-term current use of insulin [facility])   [facility] Birdie Sons, MD   5 months ago Persistent cough   [facility] Thedore Mins, [place], PA-C   6 months ago Acute bronchitis, unspecified organism   [facility] [place], [place], PA-C   7 months ago Cough, unspecified type   [facility] Birdie Sons, MD      Future Appointments            In 3 months Fisher, Kirstie Peri, MD [facility], [facility]           . montelukast (SINGULAIR) 10 MG tablet [Pharmacy Med Name: MONTELUKAST SOD 10 MG TABLET] 90 tablet     Sig: TAKE 1 TABLET BY MOUTH EVERYDAY AT BEDTIME     Pulmonology:  Leukotriene  Inhibitors Passed - 01/28/2022 10:37 AM      Passed - Valid encounter within last 12 months    Recent Outpatient Visits          1 month ago PVD (peripheral vascular disease) [facility])   [facility] Tally Joe T, FNP   1 month ago Type 2 diabetes mellitus without complication, without long-term current use of insulin [facility])   [facility] Birdie Sons, MD   5 months ago Persistent cough   [facility] Thedore Mins, [place], PA-C   6 months ago Acute bronchitis, unspecified organism   [facility] [place], [place], PA-C   7 months ago Cough, unspecified type   [facility] Birdie Sons, MD      Future Appointments            In 3 months Fisher, Kirstie Peri, MD [facility], [place]

## 2022-02-14 ENCOUNTER — Ambulatory Visit
Admission: RE | Admit: 2022-02-14 | Discharge: 2022-02-14 | Disposition: A | Discharge status: Home / Self Care | Payer: Medicare HMO | Source: Ambulatory Visit | Attending: Family Medicine | Admitting: Family Medicine

## 2022-02-14 DIAGNOSIS — Z1231 Encounter for screening mammogram for malignant neoplasm of breast: Secondary | ICD-10-CM | POA: Diagnosis not present

## 2022-02-20 ENCOUNTER — Other Ambulatory Visit (INDEPENDENT_AMBULATORY_CARE_PROVIDER_SITE_OTHER): Payer: Self-pay | Admitting: Nurse Practitioner

## 2022-02-20 ENCOUNTER — Encounter (INDEPENDENT_AMBULATORY_CARE_PROVIDER_SITE_OTHER): Payer: Self-pay | Admitting: Nurse Practitioner

## 2022-02-20 ENCOUNTER — Ambulatory Visit (INDEPENDENT_AMBULATORY_CARE_PROVIDER_SITE_OTHER): Payer: Medicare HMO

## 2022-02-20 ENCOUNTER — Ambulatory Visit (INDEPENDENT_AMBULATORY_CARE_PROVIDER_SITE_OTHER): Payer: Medicare HMO | Admitting: Nurse Practitioner

## 2022-02-20 VITALS — BP 117/74 | HR 85 | Ht 60.0 in | Wt 148.2 lb

## 2022-02-20 DIAGNOSIS — I739 Peripheral vascular disease, unspecified: Secondary | ICD-10-CM | POA: Diagnosis not present

## 2022-02-20 DIAGNOSIS — M79671 Pain in right foot: Secondary | ICD-10-CM

## 2022-02-20 DIAGNOSIS — E119 Type 2 diabetes mellitus without complications: Secondary | ICD-10-CM

## 2022-02-20 DIAGNOSIS — E785 Hyperlipidemia, unspecified: Secondary | ICD-10-CM

## 2022-02-20 DIAGNOSIS — I10 Essential (primary) hypertension: Secondary | ICD-10-CM | POA: Diagnosis not present

## 2022-02-22 ENCOUNTER — Telehealth: Payer: Self-pay | Admitting: Family Medicine

## 2022-02-22 NOTE — Telephone Encounter (Signed)
Copied from [place] [id number]. Topic: Medicare AWV >> Feb 22, 2022 10:42 AM Jae Dire wrote: Reason for CRM:  Left message for patient to call back and schedule Medicare Annual Wellness Visit (AWV) in office.   If unable to come into the office for AWV,  please offer to do virtually or by telephone.  Last AWV: 03/01/2021  Please schedule at anytime with [facility] Health Advisor.  30 minute appointment for Virtual or phone 45 minute appointment for in office or Initial virtual/phone  Any questions, please contact me at [phone]

## 2022-03-05 ENCOUNTER — Other Ambulatory Visit: Payer: Self-pay | Admitting: Family Medicine

## 2022-03-05 ENCOUNTER — Other Ambulatory Visit: Payer: Self-pay | Admitting: Physician Assistant

## 2022-03-05 DIAGNOSIS — K219 Gastro-esophageal reflux disease without esophagitis: Secondary | ICD-10-CM

## 2022-03-05 DIAGNOSIS — E119 Type 2 diabetes mellitus without complications: Secondary | ICD-10-CM

## 2022-03-05 DIAGNOSIS — R0981 Nasal congestion: Secondary | ICD-10-CM

## 2022-03-10 ENCOUNTER — Encounter: Payer: Self-pay | Admitting: Family Medicine

## 2022-03-10 ENCOUNTER — Ambulatory Visit (INDEPENDENT_AMBULATORY_CARE_PROVIDER_SITE_OTHER): Payer: Medicare HMO | Admitting: Family Medicine

## 2022-03-10 VITALS — BP 138/76 | HR 80 | Temp 98.8°F | Resp 16 | Ht 60.0 in | Wt 149.0 lb

## 2022-03-10 DIAGNOSIS — J029 Acute pharyngitis, unspecified: Secondary | ICD-10-CM | POA: Insufficient documentation

## 2022-03-10 DIAGNOSIS — B951 Streptococcus, group B, as the cause of diseases classified elsewhere: Secondary | ICD-10-CM | POA: Diagnosis not present

## 2022-03-10 LAB — POCT RAPID STREP A (OFFICE): Rapid Strep A Screen: POSITIVE — AB

## 2022-03-10 MED ORDER — CEPHALEXIN 500 MG PO CAPS: 500.0000 mg | ORAL_CAPSULE | Freq: Two times a day (BID) | ORAL | 0 refills | Status: DC

## 2022-03-10 NOTE — Assessment & Plan Note (Signed)
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

## 2022-03-10 NOTE — Progress Notes (Signed)
Established patient visit   Patient: Hannah [name]   DOB: 1950/11/07   71 y.o. Female  MRN: [id number] Visit Date: 03/10/2022  Today's healthcare provider: Gwyneth Sprout, FNP   I,Tiffany J Bragg,acting as a scribe for Gwyneth Sprout, FNP.,have documented all relevant documentation on the behalf of Gwyneth Sprout, FNP,as directed by  Gwyneth Sprout, FNP while in the presence of Gwyneth Sprout, FNP.   Chief Complaint  Patient presents with   Sore Throat    Patient complains of sore throat for 3 days, has taken tylenol with no relief.    Subjective    HPI HPI     Sore Throat    Additional comments: Patient complains of sore throat for 3 days, has taken tylenol with no relief.       Last edited by Smitty Knudsen, CMA on 03/10/2022 11:01 AM.        ---------------------------------------------------------------------------------------------------   Medications: Outpatient Medications Prior to Visit  Medication Sig   ADVAIR DISKUS 250-50 MCG/ACT AEPB INHALE 1 PUFF INTO THE LUNGS IN THE MORNING AND AT BEDTIME.   carvedilol (COREG) 12.5 MG tablet TAKE 1 TABLET BY MOUTH TWICE A DAY   cetirizine (ZYRTEC) 10 MG tablet Take 1 tablet (10 mg total) by mouth daily.   FLUoxetine (PROZAC) 40 MG capsule TAKE 1 CAPSULE BY MOUTH EVERY DAY   fluticasone (FLONASE) 50 MCG/ACT nasal spray SPRAY 2 SPRAYS INTO EACH NOSTRIL EVERY DAY   gabapentin (NEURONTIN) 600 MG tablet Take 1 tablet (600 mg total) by mouth at bedtime.   glipiZIDE (GLUCOTROL XL) 5 MG 24 hr tablet TAKE 1 TABLET BY MOUTH EVERY DAY WITH BREAKFAST   metFORMIN (GLUCOPHAGE) 1000 MG tablet TAKE 1 TABLET BY MOUTH TWICE A DAY FOR DIABETES   montelukast (SINGULAIR) 10 MG tablet TAKE 1 TABLET BY MOUTH EVERYDAY AT BEDTIME   omeprazole (PRILOSEC) 20 MG capsule TAKE 1 CAPSULE BY MOUTH EVERY DAY   simvastatin (ZOCOR) 40 MG tablet TAKE 1 TABLET BY MOUTH EVERYDAY AT BEDTIME   valsartan-hydrochlorothiazide (DIOVAN-HCT) 80-12.5 MG tablet  TAKE 1 TABLET BY MOUTH EVERY DAY   No facility-administered medications prior to visit.    Review of Systems    Objective    BP 138/76 (BP Location: Left Arm, Patient Position: Sitting, Cuff Size: Normal)   Pulse 80   Temp 98.8 F (37.1 C) (Oral)   Resp 16   Ht 5' (1.524 m)   Wt 149 lb (67.6 kg)   LMP 08/30/2003   SpO2 98%   BMI 29.10 kg/m    Physical Exam Vitals and nursing note reviewed.  Constitutional:      General: She is not in acute distress.    Appearance: Normal appearance. She is well-developed and overweight. She is ill-appearing. She is not toxic-appearing or diaphoretic.  HENT:     Head: Normocephalic and atraumatic.     Nose: No congestion.     Mouth/Throat:     Mouth: Mucous membranes are moist.     Pharynx: Uvula midline. Oropharyngeal exudate and posterior oropharyngeal erythema present. No pharyngeal swelling or uvula swelling.     Tonsils: Tonsillar exudate present. No tonsillar abscesses. 1+ on the right. 1+ on the left.  Eyes:     Conjunctiva/sclera: Conjunctivae normal.  Cardiovascular:     Rate and Rhythm: Normal rate and regular rhythm.     Pulses: Normal pulses.     Heart sounds: Normal heart sounds. No murmur heard.  No friction rub. No gallop.  Pulmonary:     Effort: Pulmonary effort is normal. No respiratory distress.     Breath sounds: Normal breath sounds. No stridor. No wheezing, rhonchi or rales.  Chest:     Chest wall: No tenderness.  Abdominal:     General: Bowel sounds are normal.     Palpations: Abdomen is soft.  Musculoskeletal:        General: No swelling, tenderness, deformity or signs of injury. Normal range of motion.     Right lower leg: No edema.     Left lower leg: No edema.  Skin:    General: Skin is warm and dry.     Capillary Refill: Capillary refill takes less than 2 seconds.     Coloration: Skin is not jaundiced or pale.     Findings: No bruising, erythema, lesion or rash.  Neurological:     General: No  focal deficit present.     Mental Status: She is alert and oriented to person, place, and time. Mental status is at baseline.     Cranial Nerves: No cranial nerve deficit.     Sensory: No sensory deficit.     Motor: No weakness.     Coordination: Coordination normal.  Psychiatric:        Mood and Affect: Mood normal.        Behavior: Behavior normal.        Thought Content: Thought content normal.        Judgment: Judgment normal.      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       Relevant Medications   cephALEXin (KEFLEX) 500 MG capsule   Sore throat - Primary    Reports exposure to sick contact, with Strep Throat has been sore Denies fevers Denies other symptoms       Relevant Medications   cephALEXin (KEFLEX) 500 MG capsule   Other Relevant Orders   POCT rapid strep A (Completed)     Return if symptoms worsen or fail to improve.      Vonna Kotyk, FNP, have reviewed all documentation for this visit. The documentation on 03/10/22 for the exam, diagnosis, procedures, and orders are all accurate and complete.    Gwyneth Sprout, [place] [phone] (phone) [phone] (fax)  [place]

## 2022-03-10 NOTE — Assessment & Plan Note (Signed)
Reports exposure to sick contact, with Strep Throat has been sore Denies fevers Denies other symptoms

## 2022-03-12 ENCOUNTER — Encounter (INDEPENDENT_AMBULATORY_CARE_PROVIDER_SITE_OTHER): Payer: Self-pay | Admitting: Nurse Practitioner

## 2022-03-12 NOTE — Progress Notes (Signed)
Subjective:    Patient ID: Hannah [name], female    DOB: May 10, 1951, 71 y.o.   MRN: [id number] Chief Complaint  Patient presents with   Establish Care    Patient in today for NP visit for foot pain and discoloration with burning and tingling.     Leamon Arnt 71-year-old female that presents today as a referral from Ms. Rollene Rotunda, NP in regards to right lower extremity pain.  She notes she feels that her feet cannot be still.  She has a burning stinging sensation although its not constant is intermittent.  She denies any claudication-like symptoms.  She denies any open wounds or ulcerations.  She denies classic rest pain symptoms.  She denies any open wounds or ulcerations.  Today noninvasive studies show a right ABI of 1.16 and a left of 1.15 the tibial artery waveforms with good toe waveforms bilaterally.  TBI's are normal bilaterally.    Review of Systems  Neurological:  Positive for numbness.  All other systems reviewed and are negative.      Objective:   Physical Exam Vitals reviewed.  HENT:     Head: Normocephalic.  Cardiovascular:     Rate and Rhythm: Normal rate.     Pulses: Normal pulses.  Pulmonary:     Effort: Pulmonary effort is normal.  Skin:    General: Skin is warm and dry.     Capillary Refill: Capillary refill takes less than 2 seconds.  Neurological:     Mental Status: She is alert and oriented to person, place, and time.  Psychiatric:        Mood and Affect: Mood normal.        Behavior: Behavior normal.        Thought Content: Thought content normal.        Judgment: Judgment normal.     BP 117/74   Pulse 85   Ht 5' (1.524 m)   Wt 148 lb 3.2 oz (67.2 kg)   LMP 08/30/2003   BMI 28.94 kg/m   Past Medical History:  Diagnosis Date   Allergy    Arthritis    Cancer ([place]) 2011-Right    DCIS. 9 mm histologic grade 2 invasive mammary carcinoma, ER 90%, PR 5%, HER-2/neu not over expressed. Sentinel nodes negative.   Chicken pox    Diabetes mellitus  without complication ([place])    Fractured elbow    History of lumpectomy 2011   Right breast   Hyperlipidemia    Hypertension 2001   Personal history of chemotherapy 2011   Right Breast   Personal history of radiation therapy 2011   Right breast   Radial head fracture, closed 12/19/2016    Social History   Socioeconomic History   Marital status: Widowed    Spouse name: Not on file   Number of children: 1   Years of education: Not on file   Highest education level: Bachelor's degree (e.g., BA, AB, BS)  Occupational History   Occupation: retired  Tobacco Use   Smoking status: Never   Smokeless tobacco: Never  Vaping Use   Vaping Use: Never used  Substance and Sexual Activity   Alcohol use: No   Drug use: No   Sexual activity: Not on file  Other Topics Concern   Not on file  Social History Narrative   Not on file   Social Determinants of Health   Financial Resource Strain: Low Risk  (03/24/2020)   Overall Financial Resource Strain (CARDIA)  Difficulty of Paying Living Expenses: Not hard at all  Food Insecurity: No Food Insecurity (06/03/2020)   Hunger Vital Sign    Worried About Running Out of Food in the Last Year: Never true    Ran Out of Food in the Last Year: Never true  Transportation Needs: No Transportation Needs (06/03/2020)   PRAPARE - Hydrologist (Medical): No    Lack of Transportation (Non-Medical): No  Physical Activity: Inactive (03/24/2020)   Exercise Vital Sign    Days of Exercise per Week: 0 days    Minutes of Exercise per Session: 0 min  Stress: No Stress Concern Present (03/24/2020)   [place]    Feeling of Stress : Only a little  Social Connections: Moderately Isolated (03/24/2020)   Social Connection and Isolation Panel [NHANES]    Frequency of Communication with Friends and Family: More than three times a week    Frequency of Social Gatherings with  Friends and Family: More than three times a week    Attends Religious Services: More than 4 times per year    Active Member of [employer] or Organizations: No    Attends Archivist Meetings: Never    Marital Status: Widowed  Intimate Partner Violence: Not At Risk (03/24/2020)   Humiliation, Afraid, Rape, and Kick questionnaire    Fear of Current or Ex-Partner: No    Emotionally Abused: No    Physically Abused: No    Sexually Abused: No    Past Surgical History:  Procedure Laterality Date   BREAST BIOPSY Right 2011   Invasive Ductal Carcinoma   BREAST LUMPECTOMY Right 2011   BREAST SURGERY Right 2011    right breast wide excision, partial breast radiation.   COLONOSCOPY  Declines   Reports she has annual stool fecal occult blood testing.   COLONOSCOPY WITH PROPOFOL N/A 11/24/2020   Procedure: COLONOSCOPY WITH PROPOFOL;  Surgeon: Robert Bellow, MD;  Location: [facility] ENDOSCOPY;  Service: Endoscopy;  Laterality: N/A;   DILATION AND CURETTAGE OF UTERUS  1990   ESOPHAGOGASTRODUODENOSCOPY (EGD) WITH PROPOFOL N/A 10/20/2020   Procedure: ESOPHAGOGASTRODUODENOSCOPY (EGD) WITH PROPOFOL;  Surgeon: Robert Bellow, MD;  Location: [facility] ENDOSCOPY;  Service: Endoscopy;  Laterality: N/A;   FRACTURE SURGERY     PORT-A-CATH REMOVAL  2012   right breast wide excision  2011   WRIST SURGERY  1998   pins placed    Family History  Problem Relation Age of Onset   Arthritis Mother    Alzheimer's disease Mother    Arthritis Father    Heart disease Father    Arthritis Maternal Grandmother    Breast cancer Maternal Grandmother    Alzheimer's disease Maternal Grandmother    Stroke Maternal Grandfather     Allergies  Allergen Reactions   Azithromycin Swelling    Itching, swelling, and rash   Codeine Other (See Comments)    Mental changes   Penicillins Itching and Rash       Latest Ref Rng & Units 02/15/2021   11:04 AM 09/17/2020    9:57 AM 03/17/2020   10:22 AM  CBC  WBC 3.4 - 10.8  x10E3/uL 6.6  6.3  7.2   Hemoglobin 11.1 - 15.9 g/dL 12.1  11.5  11.2   Hematocrit 34.0 - 46.6 % 36.3  35.8  33.8   Platelets 150 - 450 x10E3/uL 444  455  406       CMP  Component Value Date/Time   NA 137 12/05/2021 0857   K 4.6 12/05/2021 0857   CL 98 12/05/2021 0857   CO2 22 12/05/2021 0857   GLUCOSE 186 (H) 12/05/2021 0857   GLUCOSE 173 (H) 09/17/2020 0957   BUN 15 12/05/2021 0857   CREATININE 0.88 12/05/2021 0857   CREATININE 0.69 06/13/2017 0844   CALCIUM 9.6 12/05/2021 0857   CALCIUM 9.9 02/10/2014 1458   PROT 6.5 02/15/2021 1104   PROT 7.4 02/10/2014 1458   ALBUMIN 4.3 12/05/2021 0857   ALBUMIN 3.8 02/10/2014 1458   AST 14 02/15/2021 1104   AST 17 02/10/2014 1458   ALT 14 02/15/2021 1104   ALT 34 02/10/2014 1458   ALKPHOS 62 02/15/2021 1104   ALKPHOS 69 02/10/2014 1458   BILITOT 0.4 02/15/2021 1104   BILITOT 0.5 02/10/2014 1458   GFRNONAA >60 09/17/2020 0957   GFRNONAA 91 06/13/2017 0844   GFRAA >60 03/17/2020 1022   GFRAA 105 06/13/2017 0844     VAS [place] ABI WITH/WO TBI  Result Date: 02/27/2022  LOWER EXTREMITY DOPPLER STUDY Patient Name:  NIYONNA BETSILL  Date of Exam:   02/20/2022 Medical Rec #: [id number]        Accession #:    [id number] Date of Birth: 06/20/1951        Patient Gender: F Patient Age:   19 years Exam Location:  [facility] Procedure:      VAS [place] ABI WITH/WO TBI Referring Phys: Arna Medici BROWN --------------------------------------------------------------------------------  Indications: Peripheral artery disease. High Risk Factors: Hypertension, hyperlipidemia, Diabetes.  Performing Technologist: Delorise Shiner RVT  Examination Guidelines: A complete evaluation includes at minimum, Doppler waveform signals and systolic blood pressure reading at the level of bilateral brachial, anterior tibial, and posterior tibial arteries, when vessel segments are accessible. Bilateral testing is considered an integral part of a complete examination.  Photoelectric Plethysmograph (PPG) waveforms and toe systolic pressure readings are included as required and additional duplex testing as needed. Limited examinations for reoccurring indications may be performed as noted.  ABI Findings: +---------+------------------+-----+---------+--------+ Right    Rt Pressure (mmHg)IndexWaveform Comment  +---------+------------------+-----+---------+--------+ Brachial 142                                      +---------+------------------+-----+---------+--------+ ATA      165               1.16 triphasic         +---------+------------------+-----+---------+--------+ PTA      165               1.16 triphasic         +---------+------------------+-----+---------+--------+ Great Toe132               0.93                   +---------+------------------+-----+---------+--------+ +---------+------------------+-----+---------+-------+ Left     Lt Pressure (mmHg)IndexWaveform Comment +---------+------------------+-----+---------+-------+ Brachial 139                                     +---------+------------------+-----+---------+-------+ ATA      146               1.03 triphasic        +---------+------------------+-----+---------+-------+ PTA      164  1.15 biphasic         +---------+------------------+-----+---------+-------+ Great Toe119               0.84                  +---------+------------------+-----+---------+-------+ +-------+-----------+-----------+------------+------------+ ABI/TBIToday's ABIToday's TBIPrevious ABIPrevious TBI +-------+-----------+-----------+------------+------------+ Right  1.16       0.93                                +-------+-----------+-----------+------------+------------+ Left   1.15       0.84                                +-------+-----------+-----------+------------+------------+  Summary: Right: Resting right ankle-brachial index is within normal  range. No evidence of significant right lower extremity arterial disease. The right toe-brachial index is normal. Left: Resting left ankle-brachial index is within normal range. No evidence of significant left lower extremity arterial disease. The left toe-brachial index is normal. *See table(s) above for measurements and observations.  Electronically signed by Hortencia Pilar MD on 02/27/2022 at 12:56:05 PM.    Final        Assessment & Plan:   1. PVD (peripheral vascular disease) ([place]) Today noninvasive studies show that the patient has normal perfusion in her bilateral lower extremities.  No evidence of significant disease.  I suspect that the burning and stinging that the patient has as well as the inability to be still there is related to restless leg syndrome.  Patient is advised to follow-up with PCP for further work-up and treatment.  2. Type 2 diabetes mellitus without complication, without long-term current use of insulin (HCC) Continue hypoglycemic medications as already ordered, these medications have been reviewed and there are no changes at this time.  Hgb A1C to be monitored as already arranged by primary service   3. Hyperlipidemia, unspecified hyperlipidemia type Continue statin as ordered and reviewed, no changes at this time   4. Primary hypertension Continue antihypertensive medications as already ordered, these medications have been reviewed and there are no changes at this time.    Current Outpatient Medications on File Prior to Visit  Medication Sig Dispense Refill   ADVAIR DISKUS 250-50 MCG/ACT AEPB INHALE 1 PUFF INTO THE LUNGS IN THE MORNING AND AT BEDTIME. 60 each 1   carvedilol (COREG) 12.5 MG tablet TAKE 1 TABLET BY MOUTH TWICE A DAY 180 tablet 4   cetirizine (ZYRTEC) 10 MG tablet Take 1 tablet (10 mg total) by mouth daily. 30 tablet 11   FLUoxetine (PROZAC) 40 MG capsule TAKE 1 CAPSULE BY MOUTH EVERY DAY 90 capsule 3   gabapentin (NEURONTIN) 600 MG tablet Take 1  tablet (600 mg total) by mouth at bedtime. 30 tablet 2   metFORMIN (GLUCOPHAGE) 1000 MG tablet TAKE 1 TABLET BY MOUTH TWICE A DAY FOR DIABETES 180 tablet 1   montelukast (SINGULAIR) 10 MG tablet TAKE 1 TABLET BY MOUTH EVERYDAY AT BEDTIME 90 tablet 0   simvastatin (ZOCOR) 40 MG tablet TAKE 1 TABLET BY MOUTH EVERYDAY AT BEDTIME 90 tablet 0   valsartan-hydrochlorothiazide (DIOVAN-HCT) 80-12.5 MG tablet TAKE 1 TABLET BY MOUTH EVERY DAY 90 tablet 1   No current facility-administered medications on file prior to visit.    There are no Patient Instructions on file for this visit. No follow-ups on file.   Kris Hartmann, NP

## 2022-03-14 ENCOUNTER — Telehealth: Payer: Self-pay | Admitting: Family Medicine

## 2022-03-14 NOTE — Telephone Encounter (Signed)
Copied from [place] [id number]. Topic: Medicare AWV >> Mar 14, 2022 10:19 AM Jae Dire wrote: Reason for CRM:  Left message for patient to call back and schedule Medicare Annual Wellness Visit (AWV) in office.   If unable to come into the office for AWV,  please offer to do virtually or by telephone.  Last AWV: 03/01/2021  Please schedule at anytime with [facility] Health Advisor.  30 minute appointment for Virtual or phone 45 minute appointment for in office or Initial virtual/phone  Any questions, please contact me at [phone]

## 2022-04-16 ENCOUNTER — Other Ambulatory Visit: Payer: Self-pay | Admitting: Family Medicine

## 2022-04-16 DIAGNOSIS — I739 Peripheral vascular disease, unspecified: Secondary | ICD-10-CM

## 2022-04-17 NOTE — Telephone Encounter (Signed)
Requested Prescriptions  Pending Prescriptions Disp Refills  . gabapentin (NEURONTIN) 600 MG tablet [Pharmacy Med Name: GABAPENTIN 600 MG TABLET] 90 tablet 0    Sig: TAKE 1 TABLET BY MOUTH AT BEDTIME.     Neurology: Anticonvulsants - gabapentin Passed - 04/16/2022  2:23 PM      Passed - Cr in normal range and within 360 days    Creat  Date Value Ref Range Status  06/13/2017 0.69 0.50 - 0.99 mg/dL Final    Comment:    For patients >71 years of age, the reference limit for Creatinine is approximately 13% higher for people identified as African-American. .    Creatinine, Ser  Date Value Ref Range Status  12/05/2021 0.88 0.57 - 1.00 mg/dL Final   Creatinine,U  Date Value Ref Range Status  02/07/2016 188.2 mg/dL Final         Passed - Completed PHQ-2 or PHQ-9 in the last 360 days      Passed - Valid encounter within last 12 months    Recent Outpatient Visits          1 month ago Sore throat   [facility] Tally Joe T, FNP   3 months ago PVD (peripheral vascular disease) [employer] [facility])   [facility] Tally Joe T, FNP   4 months ago Type 2 diabetes mellitus without complication, without long-term current use of insulin [facility])   [facility] Birdie Sons, MD   7 months ago Persistent cough   [place], PA-C   8 months ago Acute bronchitis, unspecified organism   [facility] Mikey Kirschner, PA-C      Future Appointments            In 2 weeks Fisher, Kirstie Peri, MD [facility] [place]

## 2022-04-21 ENCOUNTER — Telehealth: Payer: Self-pay | Admitting: Family Medicine

## 2022-04-21 NOTE — Telephone Encounter (Signed)
Copied from [place] [phone]. Topic: Medicare AWV >> Apr 21, 2022 11:35 AM Jae Dire wrote: Reason for CRM:  Patient did not have a good connection Need to schedule Medicare Annual Wellness Visit (AWV) in office.   If unable to come into the office for AWV,  please offer to do virtually or by telephone.  Last AWV: 03/01/2021  Please schedule at anytime with [facility] Health Advisor.  30 minute appointment for Virtual or phone 45 minute appointment for in office or Initial virtual/phone  Any questions, please contact me at [phone]

## 2022-04-25 DIAGNOSIS — H2513 Age-related nuclear cataract, bilateral: Secondary | ICD-10-CM | POA: Diagnosis not present

## 2022-04-25 DIAGNOSIS — H25043 Posterior subcapsular polar age-related cataract, bilateral: Secondary | ICD-10-CM | POA: Diagnosis not present

## 2022-04-25 DIAGNOSIS — E119 Type 2 diabetes mellitus without complications: Secondary | ICD-10-CM | POA: Diagnosis not present

## 2022-04-25 DIAGNOSIS — H524 Presbyopia: Secondary | ICD-10-CM | POA: Diagnosis not present

## 2022-04-25 LAB — HM DIABETES EYE EXAM

## 2022-05-03 ENCOUNTER — Ambulatory Visit: Payer: Medicare HMO | Admitting: Family Medicine

## 2022-05-20 DIAGNOSIS — I10 Essential (primary) hypertension: Secondary | ICD-10-CM | POA: Diagnosis not present

## 2022-05-20 DIAGNOSIS — J45909 Unspecified asthma, uncomplicated: Secondary | ICD-10-CM | POA: Diagnosis not present

## 2022-05-20 DIAGNOSIS — M1990 Unspecified osteoarthritis, unspecified site: Secondary | ICD-10-CM | POA: Diagnosis not present

## 2022-05-20 DIAGNOSIS — E1151 Type 2 diabetes mellitus with diabetic peripheral angiopathy without gangrene: Secondary | ICD-10-CM | POA: Diagnosis not present

## 2022-05-20 DIAGNOSIS — R32 Unspecified urinary incontinence: Secondary | ICD-10-CM | POA: Diagnosis not present

## 2022-05-20 DIAGNOSIS — E785 Hyperlipidemia, unspecified: Secondary | ICD-10-CM | POA: Diagnosis not present

## 2022-05-20 DIAGNOSIS — R69 Illness, unspecified: Secondary | ICD-10-CM | POA: Diagnosis not present

## 2022-05-20 DIAGNOSIS — E669 Obesity, unspecified: Secondary | ICD-10-CM | POA: Diagnosis not present

## 2022-05-20 DIAGNOSIS — K219 Gastro-esophageal reflux disease without esophagitis: Secondary | ICD-10-CM | POA: Diagnosis not present

## 2022-05-20 DIAGNOSIS — M8580 Other specified disorders of bone density and structure, unspecified site: Secondary | ICD-10-CM | POA: Diagnosis not present

## 2022-05-20 DIAGNOSIS — E1142 Type 2 diabetes mellitus with diabetic polyneuropathy: Secondary | ICD-10-CM | POA: Diagnosis not present

## 2022-05-20 DIAGNOSIS — Z6828 Body mass index (BMI) 28.0-28.9, adult: Secondary | ICD-10-CM | POA: Diagnosis not present

## 2022-05-22 DIAGNOSIS — H2511 Age-related nuclear cataract, right eye: Secondary | ICD-10-CM | POA: Diagnosis not present

## 2022-05-22 DIAGNOSIS — H40013 Open angle with borderline findings, low risk, bilateral: Secondary | ICD-10-CM | POA: Diagnosis not present

## 2022-05-22 DIAGNOSIS — H2513 Age-related nuclear cataract, bilateral: Secondary | ICD-10-CM | POA: Diagnosis not present

## 2022-05-26 ENCOUNTER — Encounter: Payer: Self-pay | Admitting: Family Medicine

## 2022-05-26 ENCOUNTER — Ambulatory Visit (INDEPENDENT_AMBULATORY_CARE_PROVIDER_SITE_OTHER): Payer: Medicare HMO | Admitting: Family Medicine

## 2022-05-26 VITALS — BP 121/70 | HR 85 | Temp 98.1°F | Resp 16 | Wt 146.0 lb

## 2022-05-26 DIAGNOSIS — Z23 Encounter for immunization: Secondary | ICD-10-CM

## 2022-05-26 DIAGNOSIS — E119 Type 2 diabetes mellitus without complications: Secondary | ICD-10-CM

## 2022-05-26 LAB — POCT GLYCOSYLATED HEMOGLOBIN (HGB A1C)
Est. average glucose Bld gHb Est-mCnc: 194
Hemoglobin A1C: 8.4 % — AB (ref 4.0–5.6)

## 2022-05-26 MED ORDER — PIOGLITAZONE HCL 15 MG PO TABS: 15.0000 mg | ORAL_TABLET | Freq: Every day | ORAL | 3 refills | Status: DC

## 2022-05-26 NOTE — Patient Instructions (Signed)
.   Please review the attached list of medications and notify my office if there are any errors.   . Please bring all of your medications to every appointment so we can make sure that our medication list is the same as yours.   

## 2022-05-26 NOTE — Progress Notes (Signed)
I,Roshena L Chambers,acting as a scribe for Lelon Huh, MD.,have documented all relevant documentation on the behalf of Lelon Huh, MD,as directed by  Lelon Huh, MD while in the presence of Lelon Huh, MD.    Established patient visit   Patient: Hannah [name]   DOB: 1950/09/08   71 y.o. Female  MRN: [id number] Visit Date: 05/26/2022  Today's healthcare provider: Lelon Huh, MD   Chief Complaint  Patient presents with   Diabetes   Hypertension   Subjective    HPI  Diabetes Mellitus Type II, Follow-up  Lab Results  Component Value Date   HGBA1C 8.2 (A) 12/02/2021   HGBA1C 7.4 (H) 02/15/2021   HGBA1C 7.3 (A) 08/24/2020   Wt Readings from Last 3 Encounters:  05/26/22 146 lb (66.2 kg)  03/10/22 149 lb (67.6 kg)  02/20/22 148 lb 3.2 oz (67.2 kg)   Last seen for diabetes 5 months ago.  Management since then includes working on eating healthier and staying more active. Continue current medications.   She reports good compliance with treatment. She is not having side effects.  Symptoms: No fatigue No foot ulcerations  No appetite changes No nausea  No paresthesia of the feet  No polydipsia  No polyuria No visual disturbances   No vomiting     Home blood sugar records:  blood sugar is not checked  Episodes of hypoglycemia? No    Current insulin regiment: none Most Recent Eye Exam: UTD Current exercise: none Current diet habits: in general, an "unhealthy" diet  Pertinent Labs: Lab Results  Component Value Date   CHOL 179 12/05/2021   HDL 74 12/05/2021   LDLCALC 84 12/05/2021   LDLDIRECT 74.0 02/07/2016   TRIG 123 12/05/2021   CHOLHDL 2.4 12/05/2021   Lab Results  Component Value Date   NA 137 12/05/2021   K 4.6 12/05/2021   CREATININE 0.88 12/05/2021   EGFR 70 12/05/2021   MICROALBUR 1.5 02/07/2016   LABMICR 9.3 12/02/2021     ---------------------------------------------------------------------------------------------------    Hypertension, follow-up  BP Readings from Last 3 Encounters:  05/26/22 121/70  03/10/22 138/76  02/20/22 117/74   Wt Readings from Last 3 Encounters:  05/26/22 146 lb (66.2 kg)  03/10/22 149 lb (67.6 kg)  02/20/22 148 lb 3.2 oz (67.2 kg)     She was last seen for hypertension 5 months ago.  BP at that visit was 108/73. Management since that visit includes reducing valsartan-hydrochlorothiazide (DIOVAN-HCT) 80-12.5 MG tablet to 0.5 tablets by mouth daily.  She reports good compliance with treatment. She is not having side effects.  She is following a Regular diet. She is not exercising. She does not smoke.  Use of agents associated with hypertension: none.   Outside blood pressures are not checked. Symptoms: No chest pain No chest pressure  No palpitations No syncope  No dyspnea No orthopnea  No paroxysmal nocturnal dyspnea No lower extremity edema   Pertinent labs Lab Results  Component Value Date   CHOL 179 12/05/2021   HDL 74 12/05/2021   LDLCALC 84 12/05/2021   LDLDIRECT 74.0 02/07/2016   TRIG 123 12/05/2021   CHOLHDL 2.4 12/05/2021   Lab Results  Component Value Date   NA 137 12/05/2021   K 4.6 12/05/2021   CREATININE 0.88 12/05/2021   EGFR 70 12/05/2021   GLUCOSE 186 (H) 12/05/2021   TSH 1.630 01/14/2020     The 10-year ASCVD risk score (Arnett DK, et al., 2019) is: 21.5%  ---------------------------------------------------------------------------------------------------  Medications: Outpatient Medications Prior to Visit  Medication Sig Note   ADVAIR DISKUS 250-50 MCG/ACT AEPB INHALE 1 PUFF INTO THE LUNGS IN THE MORNING AND AT BEDTIME.    carvedilol (COREG) 12.5 MG tablet TAKE 1 TABLET BY MOUTH TWICE A DAY    cetirizine (ZYRTEC) 10 MG tablet Take 1 tablet (10 mg total) by mouth daily.    FLUoxetine (PROZAC) 40 MG capsule TAKE 1 CAPSULE BY MOUTH EVERY DAY    fluticasone (FLONASE) 50 MCG/ACT nasal spray SPRAY 2 SPRAYS INTO EACH NOSTRIL EVERY DAY     gabapentin (NEURONTIN) 600 MG tablet TAKE 1 TABLET BY MOUTH AT BEDTIME.    glipiZIDE (GLUCOTROL XL) 5 MG 24 hr tablet TAKE 1 TABLET BY MOUTH EVERY DAY WITH BREAKFAST    metFORMIN (GLUCOPHAGE) 1000 MG tablet TAKE 1 TABLET BY MOUTH TWICE A DAY FOR DIABETES    montelukast (SINGULAIR) 10 MG tablet TAKE 1 TABLET BY MOUTH EVERYDAY AT BEDTIME    omeprazole (PRILOSEC) 20 MG capsule TAKE 1 CAPSULE BY MOUTH EVERY DAY    simvastatin (ZOCOR) 40 MG tablet TAKE 1 TABLET BY MOUTH EVERYDAY AT BEDTIME    valsartan-hydrochlorothiazide (DIOVAN-HCT) 80-12.5 MG tablet TAKE 1 TABLET BY MOUTH EVERY DAY (Patient taking differently: Take 0.5 tablets by mouth daily.) 05/26/2022: Taking 1/2 tablet daily   [DISCONTINUED] cephALEXin (KEFLEX) 500 MG capsule Take 1 capsule (500 mg total) by mouth 2 (two) times daily. (Patient not taking: Reported on 05/26/2022)    No facility-administered medications prior to visit.    Review of Systems  Constitutional:  Negative for appetite change, chills, fatigue and fever.  Respiratory:  Negative for chest tightness and shortness of breath.   Cardiovascular:  Negative for chest pain and palpitations.  Gastrointestinal:  Negative for abdominal pain, nausea and vomiting.  Neurological:  Negative for dizziness and weakness.       Objective    BP 121/70 (BP Location: Left Arm, Patient Position: Sitting, Cuff Size: Normal)   Pulse 85   Temp 98.1 F (36.7 C) (Oral)   Resp 16   Wt 146 lb (66.2 kg)   LMP 08/30/2003   SpO2 97% Comment: room air  BMI 28.51 kg/m    Physical Exam  General appearance: Well developed, well nourished female, cooperative and in no acute distress Head: Normocephalic, without obvious abnormality, atraumatic Respiratory: Respirations even and unlabored, normal respiratory rate Extremities: All extremities are intact.  Skin: Skin color, texture, turgor normal. No rashes seen  Psych: Appropriate mood and affect. Neurologic: Mental status: Alert,  oriented to person, place, and time, thought content appropriate.   Results for orders placed or performed in visit on 05/26/22  POCT HgB A1C  Result Value Ref Range   Hemoglobin A1C 8.4 (A) 4.0 - 5.6 %   Est. average glucose Bld gHb Est-mCnc 194     Assessment & Plan     1. Type 2 diabetes mellitus without complication, without long-term current use of insulin (HCC) Not at goal. Not following strict diabetic diet which was encouraged.  Continue current medications and add pioglitazone (ACTOS) 15 MG tablet; Take 1 tablet (15 mg total) by mouth daily.  Dispense: 30 tablet; Refill: 3   2. Need for immunization against influenza  - Flu Vaccine QUAD High Dose(Fluad)   Future Appointments  Date Time Provider [place]  08/25/2022  2:20 PM Birdie Sons, MD [facility]         The entirety of the information documented in the History of Present  Illness, Review of Systems and Physical Exam were personally obtained by me. Portions of this information were initially documented by the CMA and reviewed by me for thoroughness and accuracy.     Lelon Huh, MD  [facility] [phone] (phone) [phone] (fax)  [place]

## 2022-05-29 ENCOUNTER — Encounter (INDEPENDENT_AMBULATORY_CARE_PROVIDER_SITE_OTHER): Payer: Self-pay

## 2022-05-30 DIAGNOSIS — Z853 Personal history of malignant neoplasm of breast: Secondary | ICD-10-CM | POA: Diagnosis not present

## 2022-06-12 ENCOUNTER — Other Ambulatory Visit: Payer: Self-pay | Admitting: Family Medicine

## 2022-06-12 DIAGNOSIS — I10 Essential (primary) hypertension: Secondary | ICD-10-CM

## 2022-06-12 DIAGNOSIS — K219 Gastro-esophageal reflux disease without esophagitis: Secondary | ICD-10-CM

## 2022-06-12 DIAGNOSIS — I739 Peripheral vascular disease, unspecified: Secondary | ICD-10-CM

## 2022-06-27 DIAGNOSIS — K219 Gastro-esophageal reflux disease without esophagitis: Secondary | ICD-10-CM | POA: Diagnosis not present

## 2022-06-27 DIAGNOSIS — E1136 Type 2 diabetes mellitus with diabetic cataract: Secondary | ICD-10-CM | POA: Diagnosis not present

## 2022-06-27 DIAGNOSIS — E119 Type 2 diabetes mellitus without complications: Secondary | ICD-10-CM | POA: Diagnosis not present

## 2022-06-27 DIAGNOSIS — Z961 Presence of intraocular lens: Secondary | ICD-10-CM | POA: Diagnosis not present

## 2022-06-27 DIAGNOSIS — H2511 Age-related nuclear cataract, right eye: Secondary | ICD-10-CM | POA: Diagnosis not present

## 2022-06-27 DIAGNOSIS — H25811 Combined forms of age-related cataract, right eye: Secondary | ICD-10-CM | POA: Diagnosis not present

## 2022-06-27 DIAGNOSIS — I10 Essential (primary) hypertension: Secondary | ICD-10-CM | POA: Diagnosis not present

## 2022-06-28 DIAGNOSIS — H2512 Age-related nuclear cataract, left eye: Secondary | ICD-10-CM | POA: Diagnosis not present

## 2022-07-18 DIAGNOSIS — Z79899 Other long term (current) drug therapy: Secondary | ICD-10-CM | POA: Diagnosis not present

## 2022-07-18 DIAGNOSIS — Z880 Allergy status to penicillin: Secondary | ICD-10-CM | POA: Diagnosis not present

## 2022-07-18 DIAGNOSIS — Z885 Allergy status to narcotic agent status: Secondary | ICD-10-CM | POA: Diagnosis not present

## 2022-07-18 DIAGNOSIS — Z7984 Long term (current) use of oral hypoglycemic drugs: Secondary | ICD-10-CM | POA: Diagnosis not present

## 2022-07-18 DIAGNOSIS — Z853 Personal history of malignant neoplasm of breast: Secondary | ICD-10-CM | POA: Diagnosis not present

## 2022-07-18 DIAGNOSIS — Z7951 Long term (current) use of inhaled steroids: Secondary | ICD-10-CM | POA: Diagnosis not present

## 2022-07-18 DIAGNOSIS — H2512 Age-related nuclear cataract, left eye: Secondary | ICD-10-CM | POA: Diagnosis not present

## 2022-07-18 DIAGNOSIS — E039 Hypothyroidism, unspecified: Secondary | ICD-10-CM | POA: Diagnosis not present

## 2022-07-18 DIAGNOSIS — Z881 Allergy status to other antibiotic agents status: Secondary | ICD-10-CM | POA: Diagnosis not present

## 2022-07-18 DIAGNOSIS — E1136 Type 2 diabetes mellitus with diabetic cataract: Secondary | ICD-10-CM | POA: Diagnosis not present

## 2022-07-18 DIAGNOSIS — I10 Essential (primary) hypertension: Secondary | ICD-10-CM | POA: Diagnosis not present

## 2022-07-18 DIAGNOSIS — Z961 Presence of intraocular lens: Secondary | ICD-10-CM | POA: Diagnosis not present

## 2022-07-18 DIAGNOSIS — K219 Gastro-esophageal reflux disease without esophagitis: Secondary | ICD-10-CM | POA: Diagnosis not present

## 2022-07-19 ENCOUNTER — Other Ambulatory Visit: Payer: Self-pay | Admitting: Family Medicine

## 2022-07-19 DIAGNOSIS — C50911 Malignant neoplasm of unspecified site of right female breast: Secondary | ICD-10-CM

## 2022-07-19 DIAGNOSIS — E119 Type 2 diabetes mellitus without complications: Secondary | ICD-10-CM

## 2022-07-19 DIAGNOSIS — I739 Peripheral vascular disease, unspecified: Secondary | ICD-10-CM

## 2022-07-19 DIAGNOSIS — K219 Gastro-esophageal reflux disease without esophagitis: Secondary | ICD-10-CM

## 2022-07-20 NOTE — Telephone Encounter (Signed)
Requested Prescriptions  Pending Prescriptions Disp Refills   gabapentin (NEURONTIN) 600 MG tablet [Pharmacy Med Name: GABAPENTIN 600 MG TABLET] 90 tablet 0    Sig: TAKE 1 TABLET BY MOUTH EVERYDAY AT BEDTIME     Neurology: Anticonvulsants - gabapentin Passed - 07/19/2022  6:28 PM      Passed - Cr in normal range and within 360 days    Creat  Date Value Ref Range Status  06/13/2017 0.69 0.50 - 0.99 mg/dL Final    Comment:    For patients >71 years of age, the reference limit for Creatinine is approximately 13% higher for people identified as African-American. .    Creatinine, Ser  Date Value Ref Range Status  12/05/2021 0.88 0.57 - 1.00 mg/dL Final   Creatinine,U  Date Value Ref Range Status  02/07/2016 188.2 mg/dL Final         Passed - Completed PHQ-2 or PHQ-9 in the last 360 days      Passed - Valid encounter within last 12 months    Recent Outpatient Visits           1 month ago Type 2 diabetes mellitus without complication, without long-term current use of insulin [facility])   [facility] Birdie Sons, MD   4 months ago Sore throat   [facility] Tally Joe T, FNP   6 months ago PVD (peripheral vascular disease) [facility])   [facility] Tally Joe T, FNP   7 months ago Type 2 diabetes mellitus without complication, without long-term current use of insulin [facility])   [facility] Birdie Sons, MD   10 months ago Persistent cough   [facility] Mikey Kirschner, PA-C       Future Appointments             In 1 month Fisher, Kirstie Peri, MD [facility] [place]

## 2022-07-20 NOTE — Telephone Encounter (Signed)
Unable to refill per protocol, requests are too soon. Will refuse.  Requested Prescriptions  Pending Prescriptions Disp Refills   carvedilol (COREG) 12.5 MG tablet [Pharmacy Med Name: CARVEDILOL 12.5 MG TABLET] 180 tablet 4    Sig: TAKE 1 TABLET BY MOUTH TWICE A DAY     Cardiovascular: Beta Blockers 3 Failed - 07/19/2022  6:28 PM      Failed - AST in normal range and within 360 days    AST  Date Value Ref Range Status  02/15/2021 14 0 - 40 IU/L Final   SGOT(AST)  Date Value Ref Range Status  02/10/2014 17 15 - 37 Unit/L Final         Failed - ALT in normal range and within 360 days    ALT  Date Value Ref Range Status  02/15/2021 14 0 - 32 IU/L Final   SGPT (ALT)  Date Value Ref Range Status  02/10/2014 34 12 - 78 U/L Final         Passed - Cr in normal range and within 360 days    Creat  Date Value Ref Range Status  06/13/2017 0.69 0.50 - 0.99 mg/dL Final    Comment:    For patients >61 years of age, the reference limit for Creatinine is approximately 13% higher for people identified as African-American. .    Creatinine, Ser  Date Value Ref Range Status  12/05/2021 0.88 0.57 - 1.00 mg/dL Final   Creatinine,U  Date Value Ref Range Status  02/07/2016 188.2 mg/dL Final         Passed - Last BP in normal range    BP Readings from Last 1 Encounters:  05/26/22 121/70         Passed - Last Heart Rate in normal range    Pulse Readings from Last 1 Encounters:  05/26/22 85         Passed - Valid encounter within last 6 months    Recent Outpatient Visits           1 month ago Type 2 diabetes mellitus without complication, without long-term current use of insulin ([place])   [facility] Birdie Sons, MD   4 months ago Sore throat   [facility] Tally Joe T, FNP   6 months ago PVD (peripheral vascular disease) [facility])   [facility] Tally Joe T, FNP   7 months ago Type 2 diabetes mellitus without complication,  without long-term current use of insulin [facility])   [facility] Birdie Sons, MD   10 months ago Persistent cough   [facility] Mikey Kirschner, PA-C       Future Appointments             In 1 month Fisher, Kirstie Peri, MD [facility]             omeprazole (PRILOSEC) 20 MG capsule [Pharmacy Med Name: OMEPRAZOLE DR 20 MG CAPSULE] 90 capsule 0    Sig: TAKE 1 [place]     Gastroenterology: Proton Pump Inhibitors Passed - 07/19/2022  6:28 PM      Passed - Valid encounter within last 12 months    Recent Outpatient Visits           1 month ago Type 2 diabetes mellitus without complication, without long-term current use of insulin [facility])   [facility] Birdie Sons, MD   4 months ago Sore throat  [facility] Tally Joe T, FNP   6 months ago [facility])   [facility] Tally Joe T, FNP   7 months ago Type 2 diabetes mellitus without complication, without long-term current use of insulin [facility])   [facility] Birdie Sons, MD   10 months ago Persistent cough   [facility] Mikey Kirschner, PA-C       Future Appointments             In 1 month Fisher, Kirstie Peri, MD [facility], [facility]             metFORMIN (GLUCOPHAGE) 1000 MG tablet [Pharmacy Med Name: METFORMIN HCL 1,000 MG TABLET] 180 tablet 1    Sig: TAKE 1 TABLET BY MOUTH TWICE A DAY FOR DIABETES     Endocrinology:  Diabetes - Biguanides Failed - 07/19/2022  6:28 PM      Failed - HBA1C is between 0 and 7.9 and within 180 days    Hemoglobin A1C  Date Value Ref Range Status  05/26/2022 8.4 (A) 4.0 - 5.6 % Final   Hgb A1c MFr Bld  Date Value Ref Range Status  02/15/2021 7.4 (H) 4.8 - 5.6 % Final    Comment:             Prediabetes: 5.7 - 6.4          Diabetes: >6.4          Glycemic control for adults with diabetes: <7.0           Failed - B12 Level in normal range and within 720 days    No results found for: "VITAMINB12"       Failed - CBC within normal limits and completed in the last 12 months    WBC  Date Value Ref Range Status  02/15/2021 6.6 3.4 - 10.8 x10E3/uL Final  09/17/2020 6.3 4.0 - 10.5 K/uL Final   RBC  Date Value Ref Range Status  02/15/2021 4.14 3.77 - 5.28 x10E6/uL Final  09/17/2020 4.02 3.87 - 5.11 MIL/uL Final   Hemoglobin  Date Value Ref Range Status  02/15/2021 12.1 11.1 - 15.9 g/dL Final   Hematocrit  Date Value Ref Range Status  02/15/2021 36.3 34.0 - 46.6 % Final   MCHC  Date Value Ref Range Status  02/15/2021 33.3 31.5 - 35.7 g/dL Final  09/17/2020 32.1 30.0 - 36.0 g/dL Final   [facility]  Date Value Ref Range Status  02/15/2021 29.2 26.6 - 33.0 pg Final  09/17/2020 28.6 26.0 - 34.0 pg Final   MCV  Date Value Ref Range Status  02/15/2021 88 79 - 97 fL Final  02/10/2014 91 80 - 100 fL Final   No results found for: "PLTCOUNTKUC", "LABPLAT", "POCPLA" RDW  Date Value Ref Range Status  02/15/2021 13.3 11.7 - 15.4 % Final  02/10/2014 13.2 11.5 - 14.5 % Final         Passed - Cr in normal range and within 360 days    Creat  Date Value Ref Range Status  06/13/2017 0.69 0.50 - 0.99 mg/dL Final    Comment:    For patients >38 years of age, the reference limit for Creatinine is approximately 13% higher for people identified as African-American. .    Creatinine, Ser  Date Value Ref Range Status  12/05/2021 0.88 0.57 - 1.00 mg/dL Final   Creatinine,U  Date Value Ref Range Status  02/07/2016 188.2 mg/dL Final  Passed - eGFR in normal range and within 360 days    GFR, Est African American  Date Value Ref Range Status  06/13/2017 105 > OR = 60 mL/min/1.36m Final   GFR calc Af Amer  Date Value Ref Range Status  03/17/2020 >60 >60 mL/min Final   GFR, Est Non African American  Date Value Ref Range Status  06/13/2017 91 > OR = 60 mL/min/1.759mFinal    GFR, Estimated  Date Value Ref Range Status  09/17/2020 >60 >60 mL/min Final    Comment:    (NOTE) Calculated using the CKD-EPI Creatinine Equation (2021)    GFR  Date Value Ref Range Status  02/07/2016 70.31 >60.00 mL/min Final   eGFR  Date Value Ref Range Status  12/05/2021 70 >59 mL/min/1.73 Final         Passed - Valid encounter within last 6 months    Recent Outpatient Visits           1 month ago Type 2 diabetes mellitus without complication, without long-term current use of insulin [facility]  [facility]   4 months ago Sore throat   [facility]   6 months ago PVD (peripheral vascular disease) [facility]  [facility]   7 months ago Type 2 diabetes mellitus without complication, without long-term current use of insulin ([facility]  [facility]   10 months ago Persistent cough   [facility]       Future Appointments             In 1 month Fisher, DoKirstie PeriMD [facility]

## 2022-08-25 ENCOUNTER — Ambulatory Visit (INDEPENDENT_AMBULATORY_CARE_PROVIDER_SITE_OTHER): Payer: Medicare HMO | Admitting: Family Medicine

## 2022-08-25 ENCOUNTER — Encounter: Payer: Self-pay | Admitting: Family Medicine

## 2022-08-25 VITALS — BP 118/78 | HR 84 | Temp 99.0°F | Wt 142.5 lb

## 2022-08-25 DIAGNOSIS — E119 Type 2 diabetes mellitus without complications: Secondary | ICD-10-CM | POA: Diagnosis not present

## 2022-08-25 DIAGNOSIS — I10 Essential (primary) hypertension: Secondary | ICD-10-CM

## 2022-08-25 LAB — POCT GLYCOSYLATED HEMOGLOBIN (HGB A1C)
Est. average glucose Bld gHb Est-mCnc: 189
Hemoglobin A1C: 8.2 % — AB (ref 4.0–5.6)

## 2022-08-25 MED ORDER — SITAGLIPTIN PHOSPHATE 100 MG PO TABS: 100.0000 mg | ORAL_TABLET | Freq: Every day | ORAL | 3 refills | Status: DC

## 2022-08-25 NOTE — Progress Notes (Signed)
I,Connie R Striblin,acting as a Education administrator for Lelon Huh, MD.,have documented all relevant documentation on the behalf of Lelon Huh, MD,as directed by  Lelon Huh, MD while in the presence of Lelon Huh, MD.   Established patient visit   Patient: Hannah [name]   DOB: [birth year]   72 y.o. Female  MRN: [id number] Visit Date: 08/25/2022  Today's healthcare provider: Lelon Huh, MD    Subjective    HPI  Diabetes Mellitus Type II, Follow-up  Lab Results  Component Value Date   HGBA1C 8.4 (A) 05/26/2022   HGBA1C 8.2 (A) 12/02/2021   HGBA1C 7.4 (H) 02/15/2021   Wt Readings from Last 3 Encounters:  05/26/22 146 lb (66.2 kg)  03/10/22 149 lb (67.6 kg)  02/20/22 148 lb 3.2 oz (67.2 kg)   Last seen for diabetes 3 months ago.  Management since then includes Continue current medications and add pioglitazone (ACTOS) 15 MG tablet; Take 1 tablet (15 mg total) by mouth daily.  Dispense: 30 tablet; Refill: 3 . She reports fair compliance with treatment.- patient states she did not take the medication prescribed because she read about side effects on the internet, and is most concerned about risk of bladder cancer.   She is not having side effects.   Home blood sugar records:  not being taken  Episodes of hypoglycemia? No    Current insulin regiment none    Pertinent Labs: Lab Results  Component Value Date   CHOL 179 12/05/2021   HDL 74 12/05/2021   LDLCALC 84 12/05/2021   LDLDIRECT 74.0 02/07/2016   TRIG 123 12/05/2021   CHOLHDL 2.4 12/05/2021   Lab Results  Component Value Date   NA 137 12/05/2021   K 4.6 12/05/2021   CREATININE 0.88 12/05/2021   EGFR 70 12/05/2021   LABMICR 9.3 12/02/2021   MICRALBCREAT 5 12/02/2021     --------------------------------------------------------------------------------------------------- Hypertension, follow-up  BP Readings from Last 3 Encounters:  05/26/22 121/70  03/10/22 138/76  02/20/22 117/74   Wt Readings from  Last 3 Encounters:  05/26/22 146 lb (66.2 kg)  03/10/22 149 lb (67.6 kg)  02/20/22 148 lb 3.2 oz (67.2 kg)     She was last seen for hypertension 3 months ago.  BP at that visit was 121/70. Management since that visit includes monitoring at home and continue medication  She reports good compliance with treatment. She is not having side effects.  Use of agents associated with hypertension: none.   Outside blood pressures are not being taken.   ---------------------------------------------------------------------------------------------------   Medications: Outpatient Medications Prior to Visit  Medication Sig   ADVAIR DISKUS 250-50 MCG/ACT AEPB INHALE 1 PUFF INTO THE LUNGS IN THE MORNING AND AT BEDTIME.   carvedilol (COREG) 12.5 MG tablet TAKE 1 TABLET BY MOUTH TWICE A DAY   cetirizine (ZYRTEC) 10 MG tablet Take 1 tablet (10 mg total) by mouth daily.   FLUoxetine (PROZAC) 40 MG capsule TAKE 1 CAPSULE BY MOUTH EVERY DAY   fluticasone (FLONASE) 50 MCG/ACT nasal spray SPRAY 2 SPRAYS INTO EACH NOSTRIL EVERY DAY   gabapentin (NEURONTIN) 600 MG tablet TAKE 1 TABLET BY MOUTH EVERYDAY AT BEDTIME   glipiZIDE (GLUCOTROL XL) 5 MG 24 hr tablet TAKE 1 TABLET BY MOUTH EVERY DAY WITH BREAKFAST   metFORMIN (GLUCOPHAGE) 1000 MG tablet TAKE 1 TABLET BY MOUTH TWICE A DAY FOR DIABETES   montelukast (SINGULAIR) 10 MG tablet TAKE 1 TABLET BY MOUTH EVERYDAY AT BEDTIME   omeprazole (PRILOSEC) 20 MG capsule  TAKE 1 CAPSULE BY MOUTH EVERY DAY   pioglitazone (ACTOS) 15 MG tablet TAKE 1 TABLET (15 MG TOTAL) BY MOUTH DAILY.   simvastatin (ZOCOR) 40 MG tablet TAKE 1 TABLET BY MOUTH EVERYDAY AT BEDTIME   valsartan-hydrochlorothiazide (DIOVAN-HCT) 80-12.5 MG tablet TAKE 1 TABLET BY MOUTH EVERY DAY   No facility-administered medications prior to visit.    Review of Systems  Constitutional:  Negative for appetite change, chills, fatigue and fever.  Respiratory:  Negative for chest tightness and shortness of  breath.   Cardiovascular:  Negative for chest pain and palpitations.  Gastrointestinal:  Negative for abdominal pain, nausea and vomiting.  Neurological:  Negative for dizziness and weakness.       Objective    BP 118/78 (BP Location: Left Arm, Patient Position: Sitting, Cuff Size: Normal)   Pulse 84   Temp 99 F (37.2 C) (Oral)   Wt 142 lb 8 oz (64.6 kg)   LMP 08/30/2003   SpO2 98%   BMI 27.83 kg/m    Physical Exam  General: Appearance:    Well developed, well nourished female in no acute distress  Eyes:    PERRL, conjunctiva/corneas clear, EOM's intact       Lungs:     Clear to auscultation bilaterally, respirations unlabored  Heart:    Normal heart rate. Normal rhythm. No murmurs, rubs, or gallops.    MS:   All extremities are intact.    Neurologic:   Awake, alert, oriented x 3. No apparent focal neurological defect.        Results for orders placed or performed in visit on 08/25/22  POCT HgB A1C  Result Value Ref Range   Hemoglobin A1C 8.2 (A) 4.0 - 5.6 %   Est. average glucose Bld gHb Est-mCnc 189      Assessment & Plan     1. Type 2 diabetes mellitus without complication, without long-term current use of insulin (HCC) Never started pioglitazone due to her concerns about it causing bladder cancer.  Will try adding sitaGLIPtin (JANUVIA) 100 MG tablet; Take 1 tablet (100 mg total) by mouth daily.  Dispense: 30 tablet; Refill: 3   2. Hypertension Well controlled.  Continue current medications.    Future Appointments  Date Time Provider [place]  12/29/2022  9:00 AM Caryn Section, Kirstie Peri, MD [facility]        The entirety of the information documented in the History of Present Illness, Review of Systems and Physical Exam were personally obtained by me. Portions of this information were initially documented by the CMA and reviewed by me for thoroughness and accuracy.     Lelon Huh, MD  [place] [phone]  (phone) [phone] (fax)  [place]

## 2022-08-25 NOTE — Patient Instructions (Signed)
.   Please review the attached list of medications and notify my office if there are any errors.   . Please bring all of your medications to every appointment so we can make sure that our medication list is the same as yours.   

## 2022-08-28 ENCOUNTER — Telehealth: Payer: Self-pay | Admitting: Family Medicine

## 2022-08-28 NOTE — Telephone Encounter (Signed)
Pt is calling to let Dr. Caryn Section know that she is interested in returning back to the pioglitazone (ACTOS) 15 MG tablet [[id number]]  DISCONTINUED . Preferred [place] [phone]

## 2022-08-30 MED ORDER — PIOGLITAZONE HCL 15 MG PO TABS: 15.0000 mg | ORAL_TABLET | Freq: Every day | ORAL | 1 refills | Status: DC

## 2022-10-11 ENCOUNTER — Other Ambulatory Visit: Payer: Self-pay | Admitting: Family Medicine

## 2022-10-11 DIAGNOSIS — C50911 Malignant neoplasm of unspecified site of right female breast: Secondary | ICD-10-CM

## 2022-11-01 ENCOUNTER — Other Ambulatory Visit: Payer: Self-pay | Admitting: Family Medicine

## 2022-11-01 DIAGNOSIS — K219 Gastro-esophageal reflux disease without esophagitis: Secondary | ICD-10-CM

## 2022-11-01 DIAGNOSIS — E119 Type 2 diabetes mellitus without complications: Secondary | ICD-10-CM

## 2022-11-08 ENCOUNTER — Ambulatory Visit: Payer: Medicare HMO | Admitting: Dermatology

## 2022-11-08 ENCOUNTER — Encounter: Payer: Self-pay | Admitting: Dermatology

## 2022-11-08 VITALS — BP 139/77 | HR 71

## 2022-11-08 DIAGNOSIS — L820 Inflamed seborrheic keratosis: Secondary | ICD-10-CM | POA: Diagnosis not present

## 2022-11-08 DIAGNOSIS — Z1283 Encounter for screening for malignant neoplasm of skin: Secondary | ICD-10-CM

## 2022-11-08 DIAGNOSIS — D229 Melanocytic nevi, unspecified: Secondary | ICD-10-CM

## 2022-11-08 DIAGNOSIS — L578 Other skin changes due to chronic exposure to nonionizing radiation: Secondary | ICD-10-CM

## 2022-11-08 DIAGNOSIS — I8393 Asymptomatic varicose veins of bilateral lower extremities: Secondary | ICD-10-CM | POA: Diagnosis not present

## 2022-11-08 DIAGNOSIS — L738 Other specified follicular disorders: Secondary | ICD-10-CM | POA: Diagnosis not present

## 2022-11-08 DIAGNOSIS — L821 Other seborrheic keratosis: Secondary | ICD-10-CM | POA: Diagnosis not present

## 2022-11-08 DIAGNOSIS — L814 Other melanin hyperpigmentation: Secondary | ICD-10-CM

## 2022-11-08 DIAGNOSIS — D1801 Hemangioma of skin and subcutaneous tissue: Secondary | ICD-10-CM

## 2022-11-08 NOTE — Patient Instructions (Addendum)
Cryotherapy Aftercare  Wash gently with soap and water everyday.   Apply Vaseline daily until healed.     Recommend daily broad spectrum sunscreen SPF 30+ to sun-exposed areas, reapply every 2 hours as needed. Call for new or changing lesions.  Staying in the shade or wearing long sleeves, sun glasses (UVA+UVB protection) and wide brim hats (4-inch brim around the entire circumference of the hat) are also recommended for sun protection.      Seborrheic Keratosis  What causes seborrheic keratoses? Seborrheic keratoses are harmless, common skin growths that first appear during adult life.  As time goes by, more growths appear.  Some people may develop a large number of them.  Seborrheic keratoses appear on both covered and uncovered body parts.  They are not caused by sunlight.  The tendency to develop seborrheic keratoses can be inherited.  They vary in color from skin-colored to gray, brown, or even black.  They can be either smooth or have a rough, warty surface.   Seborrheic keratoses are superficial and look as if they were stuck on the skin.  Under the microscope this type of keratosis looks like layers upon layers of skin.  That is why at times the top layer may seem to fall off, but the rest of the growth remains and re-grows.    Treatment Seborrheic keratoses do not need to be treated, but can easily be removed in the office.  Seborrheic keratoses often cause symptoms when they rub on clothing or jewelry.  Lesions can be in the way of shaving.  If they become inflamed, they can cause itching, soreness, or burning.  Removal of a seborrheic keratosis can be accomplished by freezing, burning, or surgery. If any spot bleeds, scabs, or grows rapidly, please return to have it checked, as these can be an indication of a skin cancer.   Melanoma ABCDEs  Melanoma is the most dangerous type of skin cancer, and is the leading cause of death from skin disease.  You are more likely to develop  melanoma if you: Have light-colored skin, light-colored eyes, or red or blond hair Spend a lot of time in the sun Tan regularly, either outdoors or in a tanning bed Have had blistering sunburns, especially during childhood Have a close family member who has had a melanoma Have atypical moles or large birthmarks  Early detection of melanoma is key since treatment is typically straightforward and cure rates are extremely high if we catch it early.   The first sign of melanoma is often a change in a mole or a new dark spot.  The ABCDE system is a way of remembering the signs of melanoma.  A for asymmetry:  The two halves do not match. B for border:  The edges of the growth are irregular. C for color:  A mixture of colors are present instead of an even brown color. D for diameter:  Melanomas are usually (but not always) greater than 6mm - the size of a pencil eraser. E for evolution:  The spot keeps changing in size, shape, and color.  Please check your skin once per month between visits. You can use a small mirror in front and a large mirror behind you to keep an eye on the back side or your body.   If you see any new or changing lesions before your next follow-up, please call to schedule a visit.  Please continue daily skin protection including broad spectrum sunscreen SPF 30+ to sun-exposed areas, reapplying every   2 hours as needed when you're outdoors.   Staying in the shade or wearing long sleeves, sun glasses (UVA+UVB protection) and wide brim hats (4-inch brim around the entire circumference of the hat) are also recommended for sun protection.    Due to recent changes in healthcare laws, you may see results of your pathology and/or laboratory studies on MyChart before the doctors have had a chance to review them. We understand that in some cases there may be results that are confusing or concerning to you. Please understand that not all results are received at the same time and often the  doctors may need to interpret multiple results in order to provide you with the best plan of care or course of treatment. Therefore, we ask that you please give us 2 business days to thoroughly review all your results before contacting the office for clarification. Should we see a critical lab result, you will be contacted sooner.   If You Need Anything After Your Visit  If you have any questions or concerns for your doctor, please call our main line at [phone] and press option 4 to reach your doctor's medical assistant. If no one answers, please leave a voicemail as directed and we will return your call as soon as possible. Messages left after 4 pm will be answered the following business day.   You may also send us a message via MyChart. We typically respond to MyChart messages within 1-2 business days.  For prescription refills, please ask your pharmacy to contact our office. Our fax number is [phone].  If you have an urgent issue when the clinic is closed that cannot wait until the next business day, you can page your doctor at the number below.    Please note that while we do our best to be available for urgent issues outside of office hours, we are not available 24/7.   If you have an urgent issue and are unable to reach us, you may choose to seek medical care at your doctor's office, retail clinic, urgent care center, or emergency room.  If you have a medical emergency, please immediately call 911 or go to the emergency department.  Pager Numbers  - Dr. Kowalski: [phone]  - Dr. Moye: [phone]  - Dr. Stewart: [phone]  In the event of inclement weather, please call our main line at [phone] for an update on the status of any delays or closures.  Dermatology Medication Tips: Please keep the boxes that topical medications come in in order to help keep track of the instructions about where and how to use these. Pharmacies typically print the medication  instructions only on the boxes and not directly on the medication tubes.   If your medication is too expensive, please contact our office at [phone] option 4 or send us a message through MyChart.   We are unable to tell what your co-pay for medications will be in advance as this is different depending on your insurance coverage. However, we may be able to find a substitute medication at lower cost or fill out paperwork to get insurance to cover a needed medication.   If a prior authorization is required to get your medication covered by your insurance company, please allow us 1-2 business days to complete this process.  Drug prices often vary depending on where the prescription is filled and some pharmacies may offer cheaper prices.  The website www.goodrx.com contains coupons for medications through different pharmacies. The prices here do not account   for what the cost may be with help from insurance (it may be cheaper with your insurance), but the website can give you the price if you did not use any insurance.  - You can print the associated coupon and take it with your prescription to the pharmacy.  - You may also stop by our office during regular business hours and pick up a GoodRx coupon card.  - If you need your prescription sent electronically to a different pharmacy, notify our office through  MyChart or by phone at [phone] option 4.     Si Usted Necesita Algo Despus de Su Visita  Tambin puede enviarnos un mensaje a travs de MyChart. Por lo general respondemos a los mensajes de MyChart en el transcurso de 1 a 2 das hbiles.  Para renovar recetas, por favor pida a su farmacia que se ponga en contacto con nuestra oficina. Nuestro nmero de fax es el [phone].  Si tiene un asunto urgente cuando la clnica est cerrada y que no puede esperar hasta el siguiente da hbil, puede llamar/localizar a su doctor(a) al nmero que aparece a continuacin.   Por  favor, tenga en cuenta que aunque hacemos todo lo posible para estar disponibles para asuntos urgentes fuera del horario de oficina, no estamos disponibles las 24 horas del da, los 7 das de la semana.   Si tiene un problema urgente y no puede comunicarse con nosotros, puede optar por buscar atencin mdica  en el consultorio de su doctor(a), en una clnica privada, en un centro de atencin urgente o en una sala de emergencias.  Si tiene una emergencia mdica, por favor llame inmediatamente al 911 o vaya a la sala de emergencias.  Nmeros de bper  - Dr. Kowalski: [phone]  - Dra. Moye: [phone]  - Dra. Stewart: [phone]  En caso de inclemencias del tiempo, por favor llame a nuestra lnea principal al [phone] para una actualizacin sobre el estado de cualquier retraso o cierre.  Consejos para la medicacin en dermatologa: Por favor, guarde las cajas en las que vienen los medicamentos de uso tpico para ayudarle a seguir las instrucciones sobre dnde y cmo usarlos. Las farmacias generalmente imprimen las instrucciones del medicamento slo en las cajas y no directamente en los tubos del medicamento.   Si su medicamento es muy caro, por favor, pngase en contacto con nuestra oficina llamando al [phone] y presione la opcin 4 o envenos un mensaje a travs de MyChart.   No podemos decirle cul ser su copago por los medicamentos por adelantado ya que esto es diferente dependiendo de la cobertura de su seguro. Sin embargo, es posible que podamos encontrar un medicamento sustituto a menor costo o llenar un formulario para que el seguro cubra el medicamento que se considera necesario.   Si se requiere una autorizacin previa para que su compaa de seguros cubra su medicamento, por favor permtanos de 1 a 2 das hbiles para completar este proceso.  Los precios de los medicamentos varan con frecuencia dependiendo del lugar de dnde se surte la receta y alguna farmacias  pueden ofrecer precios ms baratos.  El sitio web www.goodrx.com tiene cupones para medicamentos de diferentes farmacias. Los precios aqu no tienen en cuenta lo que podra costar con la ayuda del seguro (puede ser ms barato con su seguro), pero el sitio web puede darle el precio si no utiliz ningn seguro.  - Puede imprimir el cupn correspondiente y llevarlo con su receta a la farmacia.  - Tambin   puede pasar por nuestra oficina durante el horario de atencin regular y recoger una tarjeta de cupones de GoodRx.  - Si necesita que su receta se enve electrnicamente a una farmacia diferente, informe a nuestra oficina a travs de MyChart de [place] o por telfono llamando al [phone] y presione la opcin 4.  

## 2022-11-08 NOTE — Progress Notes (Signed)
New Patient Visit   Subjective  Hannah [name] is a 72 y.o. female who presents for the following: Skin Cancer Screening and Full Body Skin Exam. No personal Hx of skin cancer or dysplastic nevi.  She has several spots on her face and neck that are new and get irritated.  The patient presents for Total-Body Skin Exam (TBSE) for skin cancer screening and mole check. The patient has spots, moles and lesions to be evaluated, some may be new or changing and the patient has concerns that these could be cancer.    The following portions of the chart were reviewed this encounter and updated as appropriate: medications, allergies, medical history  Review of Systems:  No other skin or systemic complaints except as noted in HPI or Assessment and Plan.  Objective  Well appearing patient in no apparent distress; mood and affect are within normal limits.  A full examination was performed including scalp, head, eyes, ears, nose, lips, neck, chest, axillae, abdomen, back, buttocks, bilateral upper extremities, bilateral lower extremities, hands, feet, fingers, toes, fingernails, and toenails. All findings within normal limits unless otherwise noted below.   Relevant physical exam findings are noted in the Assessment and Plan.    Assessment & Plan   LENTIGINES, SEBORRHEIC KERATOSES, HEMANGIOMAS - Benign normal skin lesions - Benign-appearing - Call for any changes  MELANOCYTIC NEVI - Tan-brown and/or pink-flesh-colored symmetric macules and papules - Benign appearing on exam today - Observation - Call clinic for new or changing moles - Recommend daily use of broad spectrum spf 30+ sunscreen to sun-exposed areas.   ACTINIC DAMAGE - Chronic condition, secondary to cumulative UV/sun exposure - diffuse scaly erythematous macules with underlying dyspigmentation - Recommend daily broad spectrum sunscreen SPF 30+ to sun-exposed areas, reapply every 2 hours as needed.  - Staying in the shade or  wearing long sleeves, sun glasses (UVA+UVB protection) and wide brim hats (4-inch brim around the entire circumference of the hat) are also recommended for sun protection.  - Call for new or changing lesions.  INFLAMED SEBORRHEIC KERATOSIS Exam: Erythematous keratotic or waxy stuck-on papule or plaque.  Symptomatic, irritating, patient would like treated.  Benign-appearing.  Call clinic for new or changing lesions.   Prior to procedure, discussed risks of blister formation, small wound, skin dyspigmentation, or rare scar following treatment. Recommend Vaseline ointment to treated areas while healing.  Destruction Procedure Note Destruction method: cryotherapy   Informed consent: discussed and consent obtained   Lesion destroyed using liquid nitrogen: Yes   Outcome: patient tolerated procedure well with no complications   Post-procedure details: wound care instructions given   Locations: left neck x1, right neck x2,  right zygoma x1, right temple x1, left upper chest x1 # of Lesions Treated: 6   Sebaceous Hyperplasia - Small yellow papules with a central dell at face - Benign-appearing - Observe. Call for changes.  Varicose Veins/Spider Veins - Dilated blue, purple or red veins at the lower extremities - Reassured - Smaller vessels can be treated by sclerotherapy (a procedure to inject a medicine into the veins to make them disappear) if desired, but the treatment is not covered by insurance. Larger vessels may be covered if symptomatic and we would refer to vascular surgeon if treatment desired.   SKIN CANCER SCREENING PERFORMED TODAY.      Return if symptoms worsen or fail to improve.  I, Lawson Radar, CMA, am acting as scribe for Willeen Niece, MD.   Documentation: I have reviewed the  above documentation for accuracy and completeness, and I agree with the above.  Willeen Niece, MD

## 2022-12-15 ENCOUNTER — Other Ambulatory Visit: Payer: Self-pay | Admitting: Family Medicine

## 2022-12-15 DIAGNOSIS — I10 Essential (primary) hypertension: Secondary | ICD-10-CM

## 2022-12-15 NOTE — Telephone Encounter (Signed)
Requested Prescriptions  Pending Prescriptions Disp Refills   valsartan-hydrochlorothiazide (DIOVAN-HCT) 80-12.5 MG tablet [Pharmacy Med Name: VALSARTAN-HCTZ 80-12.5 MG TAB] 90 tablet 0    Sig: TAKE 1 TABLET BY MOUTH EVERY DAY     Cardiovascular: ARB + Diuretic Combos Failed - 12/15/2022  9:11 AM      Failed - K in normal range and within 180 days    Potassium  Date Value Ref Range Status  12/05/2021 4.6 3.5 - 5.2 mmol/L Final         Failed - Na in normal range and within 180 days    Sodium  Date Value Ref Range Status  12/05/2021 137 134 - 144 mmol/L Final         Failed - Cr in normal range and within 180 days    Creat  Date Value Ref Range Status  06/13/2017 0.69 0.50 - 0.99 mg/dL Final    Comment:    For patients >47 years of age, the reference limit for Creatinine is approximately 13% higher for people identified as African-American. .    Creatinine, Ser  Date Value Ref Range Status  12/05/2021 0.88 0.57 - 1.00 mg/dL Final   Creatinine,U  Date Value Ref Range Status  02/07/2016 188.2 mg/dL Final         Failed - eGFR is 10 or above and within 180 days    GFR, Est African American  Date Value Ref Range Status  06/13/2017 105 > OR = 60 mL/min/1.61m2 Final   GFR calc Af Amer  Date Value Ref Range Status  03/17/2020 >60 >60 mL/min Final   GFR, Est Non African American  Date Value Ref Range Status  06/13/2017 91 > OR = 60 mL/min/1.28m2 Final   GFR, Estimated  Date Value Ref Range Status  09/17/2020 >60 >60 mL/min Final    Comment:    (NOTE) Calculated using the CKD-EPI Creatinine Equation (2021)    GFR  Date Value Ref Range Status  02/07/2016 70.31 >60.00 mL/min Final   eGFR  Date Value Ref Range Status  12/05/2021 70 >59 mL/min/1.73 Final         Passed - Patient is not pregnant      Passed - Last BP in normal range    BP Readings from Last 1 Encounters:  11/08/22 139/77         Passed - Valid encounter within last 6 months    Recent  Outpatient Visits           3 months ago Type 2 diabetes mellitus without complication, without long-term current use of insulin (HCC)   [place] [facility] Malva Limes, MD   6 months ago Type 2 diabetes mellitus without complication, without long-term current use of insulin (HCC)   [place] [facility] Malva Limes, MD   9 months ago Sore throat   [place] [facility] Merita Norton T, FNP   11 months ago PVD (peripheral vascular disease) [facility])   [place] [facility] Merita Norton T, FNP   1 year ago Type 2 diabetes mellitus without complication, without long-term current use of insulin (HCC)   [place] [facility] Malva Limes, MD       Future Appointments             In 2 weeks Fisher, Demetrios Isaacs, MD [facility]

## 2022-12-28 NOTE — Progress Notes (Signed)
I,[name],acting as a scribe for Mila Merry, MD.,have documented all relevant documentation on the behalf of Mila Merry, MD,as directed by  Mila Merry, MD      Established patient visit   Patient: Hannah [name]   DOB: [birth year]   72 y.o. Female  MRN: [id number] Visit Date: 12/29/2022  Today's healthcare provider: Mila Merry, MD   Chief Complaint  Patient presents with   Diabetes   Hyperlipidemia   Subjective    HPI  Diabetes Mellitus Type II, follow-up  Lab Results  Component Value Date   HGBA1C 8.2 (A) 08/25/2022   HGBA1C 8.4 (A) 05/26/2022   HGBA1C 8.2 (A) 12/02/2021   Last seen for diabetes 4 months ago.  Management since then includes add Januvia 100 mg daily. Continue metformin 1000 mg BID.  Patient requested to be started back on Actos 15 mg daily instead She reports excellent compliance with treatment. She is not having side effects.   Home blood sugar records: fasting range: not being checked  Episodes of hypoglycemia? No    Current insulin regiment: none Most Recent Eye Exam: UTD  --------------------------------------------------------------------------------------------------- Hypertension, follow-up  BP Readings from Last 3 Encounters:  12/29/22 138/70  11/08/22 139/77  08/25/22 118/78   Wt Readings from Last 3 Encounters:  12/29/22 144 lb (65.3 kg)  08/25/22 142 lb 8 oz (64.6 kg)  05/26/22 146 lb (66.2 kg)     She was last seen for hypertension 4 months ago.  BP at that visit was 118/78. Management since that visit includes no changes. She reports excellent compliance with treatment. She is not having side effects.   Outside blood pressures are not being checked.  --------------------------------------------------------------------------------------------------- Lipid/Cholesterol, follow-up  Last Lipid Panel: Lab Results  Component Value Date   CHOL 179 12/05/2021   LDLCALC 84 12/05/2021   LDLDIRECT 74.0  02/07/2016   HDL 74 12/05/2021   TRIG 123 12/05/2021    She was last seen for this 1 years ago.  Management since that visit includes no changes. Continue simvastatin.   She reports excellent compliance with treatment. She is not having side effects.   Last metabolic panel Lab Results  Component Value Date   GLUCOSE 186 (H) 12/05/2021   NA 137 12/05/2021   K 4.6 12/05/2021   BUN 15 12/05/2021   CREATININE 0.88 12/05/2021   EGFR 70 12/05/2021   GFRNONAA >60 09/17/2020   CALCIUM 9.6 12/05/2021   AST 14 02/15/2021   ALT 14 02/15/2021   The 10-year ASCVD risk score (Arnett DK, et al., 2019) is: 30.1%  ---------------------------------------------------------------------------------------------------   Medications: Outpatient Medications Prior to Visit  Medication Sig   carvedilol (COREG) 12.5 MG tablet TAKE 1 TABLET BY MOUTH TWICE A DAY   cetirizine (ZYRTEC) 10 MG tablet Take 1 tablet (10 mg total) by mouth daily.   FLUoxetine (PROZAC) 40 MG capsule TAKE 1 CAPSULE BY MOUTH EVERY DAY   fluticasone (FLONASE) 50 MCG/ACT nasal spray SPRAY 2 SPRAYS INTO EACH NOSTRIL EVERY DAY   gabapentin (NEURONTIN) 600 MG tablet TAKE 1 TABLET BY MOUTH EVERYDAY AT BEDTIME   glipiZIDE (GLUCOTROL XL) 5 MG 24 hr tablet TAKE 1 TABLET BY MOUTH EVERY DAY WITH BREAKFAST   metFORMIN (GLUCOPHAGE) 1000 MG tablet TAKE 1 TABLET BY MOUTH TWICE A DAY FOR DIABETES   montelukast (SINGULAIR) 10 MG tablet TAKE 1 TABLET BY MOUTH EVERYDAY AT BEDTIME   omeprazole (PRILOSEC) 20 MG capsule TAKE 1 CAPSULE BY MOUTH EVERY DAY   pioglitazone (ACTOS) 15  MG tablet Take 1 tablet (15 mg total) by mouth daily.   simvastatin (ZOCOR) 40 MG tablet TAKE 1 TABLET BY MOUTH EVERYDAY AT BEDTIME   valsartan-hydrochlorothiazide (DIOVAN-HCT) 80-12.5 MG tablet TAKE 1 TABLET BY MOUTH EVERY DAY   ADVAIR DISKUS 250-50 MCG/ACT AEPB INHALE 1 PUFF INTO THE LUNGS IN THE MORNING AND AT BEDTIME.   sitaGLIPtin (JANUVIA) 100 MG tablet Take 1 tablet  (100 mg total) by mouth daily. (Patient not taking: Reported on 12/29/2022)   No facility-administered medications prior to visit.    Review of Systems  Constitutional:  Negative for appetite change and fatigue.  Eyes:  Negative for visual disturbance.  Respiratory:  Negative for chest tightness and shortness of breath.   Cardiovascular:  Negative for chest pain and leg swelling.  Gastrointestinal:  Negative for abdominal pain, nausea and vomiting.  Neurological:  Negative for dizziness, light-headedness and headaches.       Objective    BP 138/70 (BP Location: Left Arm, Patient Position: Sitting, Cuff Size: Normal)   Pulse 72   Temp 98.2 F (36.8 C) (Temporal)   Resp 12   Ht 5' (1.524 m)   Wt 144 lb (65.3 kg)   LMP 08/30/2003   SpO2 97%   BMI 28.12 kg/m  BP Readings from Last 3 Encounters:  12/29/22 138/70  11/08/22 139/77  08/25/22 118/78   Wt Readings from Last 3 Encounters:  12/29/22 144 lb (65.3 kg)  08/25/22 142 lb 8 oz (64.6 kg)  05/26/22 146 lb (66.2 kg)   Physical Exam  General appearance: Well developed, well nourished female, cooperative and in no acute distress Head: Normocephalic, without obvious abnormality, atraumatic Respiratory: Respirations even and unlabored, normal respiratory rate Extremities: All extremities are intact.  Skin: Skin color, texture, turgor normal. No rashes seen  Psych: Appropriate mood and affect. Neurologic: Mental status: Alert, oriented to person, place, and time, thought content appropriate.   Results for orders placed or performed in visit on 12/29/22  POCT glycosylated hemoglobin (Hb A1C)  Result Value Ref Range   Hemoglobin A1C 7.4 (A) 4.0 - 5.6 %   Est. average glucose Bld gHb Est-mCnc 166     Assessment & Plan     1. Type 2 diabetes mellitus without complication, without long-term current use of insulin (HCC) Much better, never started sitagliptan, but tolerating pioglitazone well. Continue current medications.    - Urine microalbumin-creatinine with uACR  2. Primary hypertension Well controlled.  Continue current medications.    3. Hyperlipidemia, unspecified hyperlipidemia type She is tolerating simvastatin well with no adverse effects.   - CBC - Comprehensive metabolic panel - Lipid panel      The entirety of the information documented in the History of Present Illness, Review of Systems and Physical Exam were personally obtained by me. Portions of this information were initially documented by the CMA and reviewed by me for thoroughness and accuracy.     Mila Merry, MD  [facility] [phone] (phone) [phone] (fax)  [facility]

## 2022-12-29 ENCOUNTER — Encounter: Payer: Self-pay | Admitting: Family Medicine

## 2022-12-29 ENCOUNTER — Ambulatory Visit (INDEPENDENT_AMBULATORY_CARE_PROVIDER_SITE_OTHER): Payer: Medicare HMO | Admitting: Family Medicine

## 2022-12-29 VITALS — BP 138/70 | HR 72 | Temp 98.2°F | Resp 12 | Ht 60.0 in | Wt 144.0 lb

## 2022-12-29 DIAGNOSIS — I10 Essential (primary) hypertension: Secondary | ICD-10-CM

## 2022-12-29 DIAGNOSIS — E119 Type 2 diabetes mellitus without complications: Secondary | ICD-10-CM

## 2022-12-29 DIAGNOSIS — E785 Hyperlipidemia, unspecified: Secondary | ICD-10-CM

## 2022-12-29 DIAGNOSIS — D539 Nutritional anemia, unspecified: Secondary | ICD-10-CM | POA: Diagnosis not present

## 2022-12-29 LAB — POCT GLYCOSYLATED HEMOGLOBIN (HGB A1C)
Est. average glucose Bld gHb Est-mCnc: 166
Hemoglobin A1C: 7.4 % — AB (ref 4.0–5.6)

## 2022-12-29 NOTE — Patient Instructions (Signed)
.   Please review the attached list of medications and notify my office if there are any errors.   . Please bring all of your medications to every appointment so we can make sure that our medication list is the same as yours.   

## 2022-12-30 LAB — MICROALBUMIN / CREATININE URINE RATIO: Microalbumin, Urine: 4.9 ug/mL

## 2022-12-30 LAB — COMPREHENSIVE METABOLIC PANEL: Potassium: 4.7 mmol/L (ref 3.5–5.2)

## 2022-12-31 LAB — COMPREHENSIVE METABOLIC PANEL
ALT: 9 IU/L (ref 0–32)
AST: 13 IU/L (ref 0–40)
Albumin/Globulin Ratio: 1.7 (ref 1.2–2.2)
Albumin: 4 g/dL (ref 3.8–4.8)
Alkaline Phosphatase: 58 IU/L (ref 44–121)
BUN/Creatinine Ratio: 13 (ref 12–28)
BUN: 12 mg/dL (ref 8–27)
Bilirubin Total: 0.4 mg/dL (ref 0.0–1.2)
CO2: 22 mmol/L (ref 20–29)
Calcium: 9.4 mg/dL (ref 8.7–10.3)
Chloride: 98 mmol/L (ref 96–106)
Creatinine, Ser: 0.93 mg/dL (ref 0.57–1.00)
Globulin, Total: 2.3 g/dL (ref 1.5–4.5)
Glucose: 154 mg/dL — ABNORMAL HIGH (ref 70–99)
Sodium: 138 mmol/L (ref 134–144)
Total Protein: 6.3 g/dL (ref 6.0–8.5)
eGFR: 65 mL/min/{1.73_m2} (ref >59)

## 2022-12-31 LAB — LIPID PANEL
Chol/HDL Ratio: 1.8 ratio (ref 0.0–4.4)
Cholesterol, Total: 149 mg/dL (ref 100–199)
HDL: 81 mg/dL
LDL Chol Calc (NIH): 55 mg/dL (ref 0–99)
Triglycerides: 62 mg/dL (ref 0–149)
VLDL Cholesterol Cal: 13 mg/dL (ref 5–40)

## 2022-12-31 LAB — CBC
Hematocrit: 29.9 % — ABNORMAL LOW (ref 34.0–46.6)
Hemoglobin: 9.4 g/dL — ABNORMAL LOW (ref 11.1–15.9)
MCH: 26.9 pg (ref 26.6–33.0)
MCHC: 31.4 g/dL — ABNORMAL LOW (ref 31.5–35.7)
MCV: 86 fL (ref 79–97)
Platelets: 501 10*3/uL — ABNORMAL HIGH (ref 150–450)
RBC: 3.49 x10E6/uL — ABNORMAL LOW (ref 3.77–5.28)
RDW: 14.1 % (ref 11.7–15.4)
WBC: 6.6 10*3/uL (ref 3.4–10.8)

## 2023-01-02 DIAGNOSIS — D509 Iron deficiency anemia, unspecified: Secondary | ICD-10-CM | POA: Insufficient documentation

## 2023-01-03 LAB — IRON AND TIBC
Iron Saturation: 8 % — CL (ref 15–55)
Iron: 38 ug/dL (ref 27–139)
Total Iron Binding Capacity: 482 ug/dL — ABNORMAL HIGH (ref 250–450)
UIBC: 444 ug/dL — ABNORMAL HIGH (ref 118–369)

## 2023-01-03 LAB — FERRITIN: Ferritin: 10 ng/mL — ABNORMAL LOW (ref 15–150)

## 2023-01-03 LAB — VITAMIN B12: Vitamin B-12: 373 pg/mL (ref 232–1245)

## 2023-01-03 LAB — SPECIMEN STATUS REPORT

## 2023-01-19 ENCOUNTER — Other Ambulatory Visit: Payer: Self-pay | Admitting: Family Medicine

## 2023-01-19 DIAGNOSIS — Z1231 Encounter for screening mammogram for malignant neoplasm of breast: Secondary | ICD-10-CM

## 2023-02-16 ENCOUNTER — Ambulatory Visit
Admission: RE | Admit: 2023-02-16 | Discharge: 2023-02-16 | Disposition: A | Discharge status: Home / Self Care | Payer: Medicare HMO | Source: Ambulatory Visit | Attending: Family Medicine | Admitting: Family Medicine

## 2023-02-16 DIAGNOSIS — Z1231 Encounter for screening mammogram for malignant neoplasm of breast: Secondary | ICD-10-CM | POA: Diagnosis not present

## 2023-03-03 ENCOUNTER — Other Ambulatory Visit: Payer: Self-pay | Admitting: Family Medicine

## 2023-03-05 NOTE — Telephone Encounter (Signed)
Requested Prescriptions  Pending Prescriptions Disp Refills   pioglitazone (ACTOS) 15 MG tablet [Pharmacy Med Name: PIOGLITAZONE HCL 15 MG TABLET] 90 tablet 1    Sig: TAKE 1 TABLET (15 MG TOTAL) BY MOUTH DAILY.     Endocrinology:  Diabetes - Glitazones - pioglitazone Passed - 03/03/2023  4:10 PM      Passed - HBA1C is between 0 and 7.9 and within 180 days    Hemoglobin A1C  Date Value Ref Range Status  12/29/2022 7.4 (A) 4.0 - 5.6 % Final   Hgb A1c MFr Bld  Date Value Ref Range Status  02/15/2021 7.4 (H) 4.8 - 5.6 % Final    Comment:             Prediabetes: 5.7 - 6.4          Diabetes: >6.4          Glycemic control for adults with diabetes: <7.0          Passed - Valid encounter within last 6 months    Recent Outpatient Visits           2 months ago Type 2 diabetes mellitus without complication, without long-term current use of insulin (HCC)   [place] [facility] Malva Limes, MD   6 months ago Type 2 diabetes mellitus without complication, without long-term current use of insulin (HCC)   [place] [facility] Malva Limes, MD   9 months ago Type 2 diabetes mellitus without complication, without long-term current use of insulin [facility])   [place] [facility] Malva Limes, MD   12 months ago Sore throat   [place] [facility] Merita Norton T, FNP   1 year ago PVD (peripheral vascular disease) [facility])    [facility] Jacky Kindle, [place]

## 2023-03-10 DIAGNOSIS — Z008 Encounter for other general examination: Secondary | ICD-10-CM | POA: Diagnosis not present

## 2023-03-10 DIAGNOSIS — F419 Anxiety disorder, unspecified: Secondary | ICD-10-CM | POA: Diagnosis not present

## 2023-03-10 DIAGNOSIS — K219 Gastro-esophageal reflux disease without esophagitis: Secondary | ICD-10-CM | POA: Diagnosis not present

## 2023-03-10 DIAGNOSIS — J45909 Unspecified asthma, uncomplicated: Secondary | ICD-10-CM | POA: Diagnosis not present

## 2023-03-10 DIAGNOSIS — I129 Hypertensive chronic kidney disease with stage 1 through stage 4 chronic kidney disease, or unspecified chronic kidney disease: Secondary | ICD-10-CM | POA: Diagnosis not present

## 2023-03-10 DIAGNOSIS — E785 Hyperlipidemia, unspecified: Secondary | ICD-10-CM | POA: Diagnosis not present

## 2023-03-10 DIAGNOSIS — M5440 Lumbago with sciatica, unspecified side: Secondary | ICD-10-CM | POA: Diagnosis not present

## 2023-03-10 DIAGNOSIS — I2510 Atherosclerotic heart disease of native coronary artery without angina pectoris: Secondary | ICD-10-CM | POA: Diagnosis not present

## 2023-03-10 DIAGNOSIS — Z79899 Other long term (current) drug therapy: Secondary | ICD-10-CM | POA: Diagnosis not present

## 2023-03-10 DIAGNOSIS — E1142 Type 2 diabetes mellitus with diabetic polyneuropathy: Secondary | ICD-10-CM | POA: Diagnosis not present

## 2023-03-10 DIAGNOSIS — N189 Chronic kidney disease, unspecified: Secondary | ICD-10-CM | POA: Diagnosis not present

## 2023-03-10 DIAGNOSIS — M1990 Unspecified osteoarthritis, unspecified site: Secondary | ICD-10-CM | POA: Diagnosis not present

## 2023-03-10 DIAGNOSIS — R32 Unspecified urinary incontinence: Secondary | ICD-10-CM | POA: Diagnosis not present

## 2023-03-16 ENCOUNTER — Other Ambulatory Visit: Payer: Self-pay | Admitting: Family Medicine

## 2023-03-16 DIAGNOSIS — I10 Essential (primary) hypertension: Secondary | ICD-10-CM

## 2023-04-12 ENCOUNTER — Other Ambulatory Visit: Payer: Self-pay | Admitting: Family Medicine

## 2023-04-14 ENCOUNTER — Other Ambulatory Visit: Payer: Self-pay | Admitting: Family Medicine

## 2023-04-14 DIAGNOSIS — E119 Type 2 diabetes mellitus without complications: Secondary | ICD-10-CM

## 2023-04-21 ENCOUNTER — Other Ambulatory Visit: Payer: Self-pay | Admitting: Family Medicine

## 2023-04-21 DIAGNOSIS — R0981 Nasal congestion: Secondary | ICD-10-CM

## 2023-04-24 NOTE — Telephone Encounter (Signed)
Requested Prescriptions  Pending Prescriptions Disp Refills   fluticasone (FLONASE) 50 MCG/ACT nasal spray [Pharmacy Med Name: FLUTICASONE PROP 50 MCG SPRAY] 48 mL 0    Sig: SPRAY 2 SPRAYS INTO EACH NOSTRIL EVERY DAY     Ear, Nose, and Throat: Nasal Preparations - Corticosteroids Passed - 04/21/2023  3:11 PM      Passed - Valid encounter within last 12 months    Recent Outpatient Visits           3 months ago Type 2 diabetes mellitus without complication, without long-term current use of insulin (HCC)   [place] [facility] Malva Limes, MD   8 months ago Type 2 diabetes mellitus without complication, without long-term current use of insulin (HCC)   [place] [facility] Malva Limes, MD   11 months ago Type 2 diabetes mellitus without complication, without long-term current use of insulin (HCC)   [place] [facility] Malva Limes, MD   1 year ago Sore throat   [place] [facility] Merita Norton T, FNP   1 year ago PVD (peripheral vascular disease) [facility])    [facility] Jacky Kindle, [place]

## 2023-04-25 ENCOUNTER — Other Ambulatory Visit: Payer: Self-pay | Admitting: Family Medicine

## 2023-04-25 DIAGNOSIS — R0981 Nasal congestion: Secondary | ICD-10-CM

## 2023-04-30 ENCOUNTER — Ambulatory Visit: Payer: Medicare HMO | Admitting: Family Medicine

## 2023-04-30 ENCOUNTER — Encounter: Payer: Self-pay | Admitting: Family Medicine

## 2023-04-30 VITALS — BP 180/84 | HR 66 | Ht 60.0 in | Wt 141.9 lb

## 2023-04-30 DIAGNOSIS — D508 Other iron deficiency anemias: Secondary | ICD-10-CM

## 2023-04-30 DIAGNOSIS — J309 Allergic rhinitis, unspecified: Secondary | ICD-10-CM | POA: Diagnosis not present

## 2023-04-30 DIAGNOSIS — G2581 Restless legs syndrome: Secondary | ICD-10-CM

## 2023-04-30 DIAGNOSIS — Z0001 Encounter for general adult medical examination with abnormal findings: Secondary | ICD-10-CM | POA: Diagnosis not present

## 2023-04-30 DIAGNOSIS — E119 Type 2 diabetes mellitus without complications: Secondary | ICD-10-CM

## 2023-04-30 DIAGNOSIS — I10 Essential (primary) hypertension: Secondary | ICD-10-CM

## 2023-04-30 DIAGNOSIS — Z0000 Encounter for general adult medical examination without abnormal findings: Secondary | ICD-10-CM

## 2023-04-30 DIAGNOSIS — J329 Chronic sinusitis, unspecified: Secondary | ICD-10-CM

## 2023-04-30 DIAGNOSIS — R0981 Nasal congestion: Secondary | ICD-10-CM

## 2023-04-30 DIAGNOSIS — I739 Peripheral vascular disease, unspecified: Secondary | ICD-10-CM

## 2023-04-30 DIAGNOSIS — E785 Hyperlipidemia, unspecified: Secondary | ICD-10-CM

## 2023-04-30 DIAGNOSIS — Z23 Encounter for immunization: Secondary | ICD-10-CM | POA: Diagnosis not present

## 2023-04-30 MED ORDER — TETANUS-DIPHTH-ACELL PERTUSSIS 5-2.5-18.5 LF-MCG/0.5 IM SUSY: 0.5000 mL | PREFILLED_SYRINGE | Freq: Once | INTRAMUSCULAR | 0 refills | Status: AC

## 2023-04-30 MED ORDER — CETIRIZINE HCL 10 MG PO TABS
10.0000 mg | ORAL_TABLET | Freq: Every day | ORAL | 2 refills | Status: DC
Start: 2023-04-30 — End: 2024-02-21

## 2023-04-30 MED ORDER — FLUTICASONE PROPIONATE 50 MCG/ACT NA SUSP
2.0000 | Freq: Every day | NASAL | 2 refills | Status: DC
Start: 2023-04-30 — End: 2024-01-24

## 2023-04-30 MED ORDER — GABAPENTIN 600 MG PO TABS
600.0000 mg | ORAL_TABLET | Freq: Every day | ORAL | 2 refills | Status: DC
Start: 2023-04-30 — End: 2024-03-16

## 2023-04-30 NOTE — Patient Instructions (Signed)
Please review the attached list of medications and notify my office if there are any errors.   Start taking the iron supplement every other day, you can take a fiber supplement such as metamucil if you get constipated.   Start taking a full tablet of your blood pressure medications.

## 2023-04-30 NOTE — Progress Notes (Signed)
Annual Wellness Visit     Patient: Hannah [name], Female    DOB: 27-Apr-1951, 71 y.o.   MRN: [id number] Visit Date: 04/30/2023  Today's Provider: Mila Merry, MD   Chief Complaint  Patient presents with   Medical Management of Chronic Issues    4 month wellness check. Has confidential questions to ask    Medication Refill    Zyrtec and flonase    Hypertension   Diabetes   Subjective    Hannah [name] is a 72 y.o. female who presents today for her Annual Wellness Visit.  Medications: Outpatient Medications Prior to Visit  Medication Sig   carvedilol (COREG) 12.5 MG tablet TAKE 1 TABLET BY MOUTH TWICE A DAY   FLUoxetine (PROZAC) 40 MG capsule TAKE 1 CAPSULE BY MOUTH EVERY DAY   glipiZIDE (GLUCOTROL XL) 5 MG 24 hr tablet TAKE 1 TABLET BY MOUTH EVERY DAY WITH BREAKFAST   metFORMIN (GLUCOPHAGE) 1000 MG tablet TAKE 1 TABLET BY MOUTH TWICE A DAY FOR DIABETES   montelukast (SINGULAIR) 10 MG tablet TAKE 1 TABLET BY MOUTH EVERYDAY AT BEDTIME   omeprazole (PRILOSEC) 20 MG capsule TAKE 1 CAPSULE BY MOUTH EVERY DAY   pioglitazone (ACTOS) 15 MG tablet TAKE 1 TABLET (15 MG TOTAL) BY MOUTH DAILY.   simvastatin (ZOCOR) 40 MG tablet TAKE 1 TABLET BY MOUTH EVERYDAY AT BEDTIME   valsartan-hydrochlorothiazide (DIOVAN-HCT) 80-12.5 MG tablet TAKE 1 TABLET BY MOUTH EVERY DAY   [DISCONTINUED] cetirizine (ZYRTEC) 10 MG tablet Take 1 tablet (10 mg total) by mouth daily.   [DISCONTINUED] fluticasone (FLONASE) 50 MCG/ACT nasal spray SPRAY 2 SPRAYS INTO EACH NOSTRIL EVERY DAY   [DISCONTINUED] gabapentin (NEURONTIN) 600 MG tablet TAKE 1 TABLET BY MOUTH EVERYDAY AT BEDTIME (Patient not taking: Reported on 04/30/2023)   No facility-administered medications prior to visit.    Allergies  Allergen Reactions   Azithromycin Swelling    Itching, swelling, and rash   Codeine Other (See Comments)    Mental changes   Penicillins Itching and Rash    Patient Care Team: Malva Limes, MD as PCP -  General (Family Medicine) Earna Coder, MD as Consulting Physician (Internal Medicine) [place], [facility]) Lemar Livings, Merrily Pew, MD as Consulting Physician (General Surgery)    Objective      Most recent functional status assessment:    12/29/2022    9:09 AM  In your present state of health, do you have any difficulty performing the following activities:  Hearing? 0  Vision? 0  Difficulty concentrating or making decisions? 0  Walking or climbing stairs? 0  Dressing or bathing? 0  Doing errands, shopping? 0   Most recent fall risk assessment:    12/29/2022    9:08 AM  Fall Risk   Falls in the past year? 0  Number falls in past yr: 0  Injury with Fall? 0  Risk for fall due to : No Fall Risks  Follow up Falls evaluation completed    Most recent depression screenings:    04/30/2023    8:31 AM 12/29/2022    9:08 AM  PHQ 2/9 Scores  PHQ - 2 Score 2 0  PHQ- 9 Score 7 4   Most recent cognitive screening:    03/24/2020    2:10 PM  6CIT Screen  What Year? 0 points  What month? 0 points  What time? 0 points  Count back from 20 0 points  Months in reverse 2 points  Repeat  phrase 2 points  Total Score 4 points   Most recent Audit-C alcohol use screening    12/29/2022    9:09 AM  Alcohol Use Disorder Test (AUDIT)  1. How often do you have a drink containing alcohol? 0  2. How many drinks containing alcohol do you have on a typical day when you are drinking? 0  3. How often do you have six or more drinks on one occasion? 0  AUDIT-C Score 0   A score of 3 or more in women, and 4 or more in men indicates increased risk for alcohol abuse, EXCEPT if all of the points are from question 1   No results found for any visits on 04/30/23.  Assessment & Plan     Annual wellness visit done today including the all of the following: Reviewed patient's Family Medical History Reviewed and updated list of patient's medical providers Assessment of cognitive  impairment was done Assessed patient's functional ability Established a written schedule for health screening services Health Risk Assessent Completed and Reviewed  Exercise Activities and Dietary recommendations  Goals      Chronic Disease Management     CARE PLAN ENTRY (see longitudinal plan of care for additional care plan information)  Current Barriers:  Chronic Disease Management support and education needs related to HTN, HLD, DM and GERD.  Nurse Case Manager Clinical Goal(s):  Over the next 120 days, patient: Will not require hospitalization or emergent care d/t complications r/t chronic illnesses. Over the next 90 days, patient: Will attend all scheduled medical appointments. Will take all medications as scheduled. Will monitor BP and record readings. Will adhere to recommended cardiac prudent/diabetic diet. Will follow recommended safety precautions to prevent falls and injuries.  Over the next 30 days, patient: Follow up with the surgical team as scheduled regarding colonoscopy.   Interventions:  Inter-disciplinary care team collaboration (see longitudinal plan of care)   Reviewed compliance with plan for diabetes management. Reports  attempting to adhere to recommended plan. She continues attempting to manage with medications and diet. Prefers not to monitor blood glucose levels daily. We discussed current nutrition along with heart healthy/diabetic friendly food options. Agreed to update the team if additional educational resources are needed.   Provided information regarding safety and fall prevention measures. Encourage to use caution when ambulating. Advised to keep pathways clear and well lit. Reports ambulating well. Attempts to light walks daily. Denies recent falls.   Reviewed scheduled/pending appointments. Encouraged to attend medical appointments as scheduled to prevent delays in care. Encouraged to notify care management team with concerns regarding  transportation. Pending outreach with the surgical team on 06/08/20 to discuss plan/schedule a colonoscopy. Denies concerns regarding transportation.   Discussed plans for ongoing care management follow up. Provided direct contact information for CCM Nurse Case Manager.   Patient Self Care Activities:  Self administers medications Attends scheduled provider appointments Calls pharmacy for medication refills Attends church or other social activities Performs ADL's independently Performs IADL's independently Calls provider office for new concerns or questions  Initial goal documentation      Exercise 3x per week (30 min per time)     Recommend to start exercising 3 days a week for 30 minutes.         Immunization History  Administered Date(s) Administered   Fluad Quad(high Dose 65+) 05/17/2020, 05/02/2021, 05/26/2022   Fluad Trivalent(High Dose 65+) 04/30/2023   Influenza, High Dose Seasonal PF 06/11/2017, 06/04/2018   Influenza-Unspecified 05/22/2019   PFIZER(Purple Top)SARS-COV-2  Vaccination 10/01/2019, 10/22/2019   Pneumococcal Conjugate-13 02/07/2016   Pneumococcal Polysaccharide-23 06/11/2017   Tdap 12/04/2011   Zoster Recombinant(Shingrix) 04/16/2021   Zoster, Live 09/11/2013    Health Maintenance  Topic Date Due   COVID-19 Vaccine (3 - Pfizer risk series) 11/19/2019   Zoster Vaccines- Shingrix (2 of 2) 06/11/2021   DTaP/Tdap/Td (2 - Td or Tdap) 12/03/2021   OPHTHALMOLOGY EXAM  04/26/2023   HEMOGLOBIN A1C  06/30/2023   Fecal DNA (Cologuard)  07/10/2023   Diabetic kidney evaluation - eGFR measurement  12/29/2023   Diabetic kidney evaluation - Urine ACR  12/29/2023   Medicare Annual Wellness (AWV)  04/29/2024   MAMMOGRAM  02/15/2025   Pneumonia Vaccine 59+ Years old  Completed   INFLUENZA VACCINE  Completed   DEXA SCAN  Completed   Hepatitis C Screening  Completed   HPV VACCINES  Aged Out     Discussed health benefits of physical activity, and encouraged her  to engage in regular exercise appropriate for her age and condition.           Mila Merry, MD  [facility] [phone] (phone) [phone] (fax)  [facility]

## 2023-04-30 NOTE — Progress Notes (Signed)
Complete physical exam   Patient: Hannah [name]   DOB: 1951-07-08   72 y.o. Female  MRN: [id number] Visit Date: 04/30/2023  Today's healthcare provider: Mila Merry, MD   Chief Complaint  Patient presents with   Medical Management of Chronic Issues    4 month wellness check. Has confidential questions to ask    Medication Refill    Zyrtec and flonase    Hypertension   Diabetes   Subjective    Discussed the use of AI scribe software for clinical note transcription with the patient, who gave verbal consent to proceed.  History of Present Illness   The patient presents for a routine physical with concerns about her current medication regimen, specifically the cost and efficacy of her gabapentin prescription for restless leg syndrome. She reports having stopped taking the medication due to its high cost, with the last dose taken over a year ago. The patient also expresses a need for allergy medication, specifically cetirizine and Flonase, which she had previously put on hold due to an excess supply.  The patient also reports experiencing shortness of breath, which she attributes to anxiety. She is currently taking fluoxetine (Prozac) for this condition but is unsure of the dosage. She also mentions occasional cramping and tingling, which she believes may be related to her iron deficiency. The patient has been taking an iron supplement with vitamin C, but not consistently due to concerns about constipation.  The patient's blood pressure is also a concern, with recent readings being high. She is currently on valsartan HCTZ and carvedilol for hypertension but is unsure of the dosages. she thinks she is just taking half tablet of the valsartan-hctz. She expresses a need to monitor her blood pressure and blood sugar levels at home, but currently does not have the necessary equipment.  The patient's immunization status is also discussed, with a tetanus shot due and a second shingles  vaccine being considered. She has received the first dose of the shingles vaccine without any adverse effects. The patient also mentions having cataract surgery in the previous year.      HPI     Medical Management of Chronic Issues    Additional comments: 4 month wellness check. Has confidential questions to ask         Medication Refill    Additional comments: Zyrtec and flonase       Last edited by Malva Limes, MD on 04/30/2023  8:40 AM.       Past Medical History:  Diagnosis Date   Allergy    Arthritis    Cancer (HCC) 2011-Right    DCIS. 9 mm histologic grade 2 invasive mammary carcinoma, ER 90%, PR 5%, HER-2/neu not over expressed. Sentinel nodes negative.   Chicken pox    Diabetes mellitus without complication (HCC)    Fractured elbow    History of lumpectomy 2011   Right breast   Hyperlipidemia    Hypertension 2001   Personal history of chemotherapy 2011   Right Breast   Personal history of radiation therapy 2011   Right breast   Radial head fracture, closed 12/19/2016   Past Surgical History:  Procedure Laterality Date   BREAST BIOPSY Right 2011   Invasive Ductal Carcinoma   BREAST LUMPECTOMY Right 2011   BREAST SURGERY Right 2011    right breast wide excision, partial breast radiation.   COLONOSCOPY  Declines   Reports she has annual stool fecal occult blood testing.   COLONOSCOPY  WITH PROPOFOL N/A 11/24/2020   Procedure: COLONOSCOPY WITH PROPOFOL;  Surgeon: [name], MD;  Location: [facility] ENDOSCOPY;  Service: Endoscopy;  Laterality: N/A;   DILATION AND CURETTAGE OF UTERUS  1990   ESOPHAGOGASTRODUODENOSCOPY (EGD) WITH PROPOFOL N/A 10/20/2020   Procedure: ESOPHAGOGASTRODUODENOSCOPY (EGD) WITH PROPOFOL;  Surgeon: [name], MD;  Location: [facility] ENDOSCOPY;  Service: Endoscopy;  Laterality: N/A;   FRACTURE SURGERY     PORT-A-CATH REMOVAL  2012   right breast wide excision  2011   WRIST SURGERY  1998   pins placed   Social History    Socioeconomic History   Marital status: Widowed    Spouse name: Not on file   Number of children: 1   Years of education: Not on file   Highest education level: Bachelor's degree (e.g., BA, AB, BS)  Occupational History   Occupation: retired  Tobacco Use   Smoking status: Never   Smokeless tobacco: Never  Vaping Use   Vaping status: Never Used  Substance and Sexual Activity   Alcohol use: No   Drug use: No   Sexual activity: Not on file  Other Topics Concern   Not on file  Social History Narrative   Not on file   Social Determinants of Health   Financial Resource Strain: Low Risk  (12/25/2022)   Overall Financial Resource Strain (CARDIA)    Difficulty of Paying Living Expenses: Not hard at all  Food Insecurity: No Food Insecurity (12/25/2022)   Hunger Vital Sign    Worried About Running Out of Food in the Last Year: Never true    Ran Out of Food in the Last Year: Never true  Transportation Needs: No Transportation Needs (12/25/2022)   PRAPARE - Administrator, Civil Service (Medical): No    Lack of Transportation (Non-Medical): No  Physical Activity: Insufficiently Active (12/25/2022)   Exercise Vital Sign    Days of Exercise per Week: 2 days    Minutes of Exercise per Session: 20 min  Stress: Stress Concern Present (12/25/2022)   [employer] of Occupational Health - Occupational Stress Questionnaire    Feeling of Stress : To some extent  Social Connections: Moderately Integrated (12/25/2022)   Social Connection and Isolation Panel [NHANES]    Frequency of Communication with Friends and Family: More than three times a week    Frequency of Social Gatherings with Friends and Family: Once a week    Attends Religious Services: More than 4 times per year    Active Member of [employer] or Organizations: Yes    Attends Banker Meetings: More than 4 times per year    Marital Status: Widowed  Intimate Partner Violence: Unknown (05/22/2022)   Received  [facility]   HITS    Physically Hurt: Not on file    Insult or Talk Down To: Not on file    Threaten Physical Harm: Not on file    Scream or Curse: Not on file   Family Status  Relation Name Status   Mother  Alive   Father  (Not Specified)   MGM  (Not Specified)   MGF  (Not Specified)  No partnership data on file   Family History  Problem Relation Age of Onset   Arthritis Mother    Alzheimer's disease Mother    Arthritis Father    Heart disease Father    Arthritis Maternal Grandmother    Breast cancer Maternal Grandmother    Alzheimer's disease Maternal  Grandmother    Stroke Maternal Grandfather    Allergies  Allergen Reactions   Azithromycin Swelling    Itching, swelling, and rash   Codeine Other (See Comments)    Mental changes   Penicillins Itching and Rash    Patient Care Team: Malva Limes, MD as PCP - General (Family Medicine) Earna Coder, MD as Consulting Physician (Internal Medicine) Pa, [facility] (Optometry) [place], Merrily Pew, MD as Consulting Physician (General Surgery)   Medications: Outpatient Medications Prior to Visit  Medication Sig   carvedilol (COREG) 12.5 MG tablet TAKE 1 TABLET BY MOUTH TWICE A DAY   FLUoxetine (PROZAC) 40 MG capsule TAKE 1 CAPSULE BY MOUTH EVERY DAY   glipiZIDE (GLUCOTROL XL) 5 MG 24 hr tablet TAKE 1 TABLET BY MOUTH EVERY DAY WITH BREAKFAST   metFORMIN (GLUCOPHAGE) 1000 MG tablet TAKE 1 TABLET BY MOUTH TWICE A DAY FOR DIABETES   montelukast (SINGULAIR) 10 MG tablet TAKE 1 TABLET BY MOUTH EVERYDAY AT BEDTIME   omeprazole (PRILOSEC) 20 MG capsule TAKE 1 CAPSULE BY MOUTH EVERY DAY   pioglitazone (ACTOS) 15 MG tablet TAKE 1 TABLET (15 MG TOTAL) BY MOUTH DAILY.   simvastatin (ZOCOR) 40 MG tablet TAKE 1 TABLET BY MOUTH EVERYDAY AT BEDTIME   valsartan-hydrochlorothiazide (DIOVAN-HCT) 80-12.5 MG tablet TAKE 1 TABLET BY MOUTH EVERY DAY   [DISCONTINUED] cetirizine (ZYRTEC) 10 MG tablet Take 1  tablet (10 mg total) by mouth daily.   [DISCONTINUED] fluticasone (FLONASE) 50 MCG/ACT nasal spray SPRAY 2 SPRAYS INTO EACH NOSTRIL EVERY DAY   [DISCONTINUED] gabapentin (NEURONTIN) 600 MG tablet TAKE 1 TABLET BY MOUTH EVERYDAY AT BEDTIME (Patient not taking: Reported on 04/30/2023)   No facility-administered medications prior to visit.    Review of Systems  Constitutional:  Negative for appetite change, chills, fatigue and fever.  Respiratory:  Negative for chest tightness.   Cardiovascular:  Negative for chest pain and palpitations.  Gastrointestinal:  Negative for abdominal pain, nausea and vomiting.  Neurological:  Negative for dizziness and weakness.      Objective    BP (!) 180/84 (BP Location: Left Arm, Patient Position: Sitting, Cuff Size: Large)   Pulse 66   Ht 5' (1.524 m)   Wt 141 lb 14.4 oz (64.4 kg)   LMP 08/30/2003   BMI 27.71 kg/m   Repeat BP = 150/80  Physical Exam  General Appearance:    Well developed, well nourished female. Alert, cooperative, in no acute distress, appears stated age   Head:    Normocephalic, without obvious abnormality, atraumatic  Eyes:    PERRL, conjunctiva/corneas clear, EOM's intact, fundi    benign, both eyes  Ears:    Normal TM's and external ear canals, both ears  Nose:   Nares normal, septum midline, mucosa normal, no drainage    or sinus tenderness  Throat:   Lips, mucosa, and tongue normal; teeth and gums normal  Neck:   Supple, symmetrical, trachea midline, no adenopathy;    thyroid:  no enlargement/tenderness/nodules; no carotid   bruit or JVD  Back:     Symmetric, no curvature, ROM normal, no CVA tenderness  Lungs:     Clear to auscultation bilaterally, respirations unlabored  Chest Wall:    No tenderness or deformity   Heart:    Normal heart rate. Normal rhythm. No murmurs, rubs, or gallops.   Breast Exam:    deferred  Abdomen:     Soft, non-tender, bowel sounds active all four quadrants,  no masses, no organomegaly   Pelvic:    deferred  Extremities:   All extremities are intact. No cyanosis or edema  Pulses:   2+ and symmetric all extremities  Skin:   Skin color, texture, turgor normal, no rashes or lesions  Lymph nodes:   Cervical, supraclavicular, and axillary nodes normal  Neurologic:   CNII-XII intact, normal strength, sensation and reflexes    throughout       Assessment & Plan    Routine Health Maintenance and Physical Exam  Exercise Activities and Dietary recommendations  Goals      Chronic Disease Management     CARE PLAN ENTRY (see longitudinal plan of care for additional care plan information)  Current Barriers:  Chronic Disease Management support and education needs related to HTN, HLD, DM and GERD.  Nurse Case Manager Clinical Goal(s):  Over the next 120 days, patient: Will not require hospitalization or emergent care d/t complications r/t chronic illnesses. Over the next 90 days, patient: Will attend all scheduled medical appointments. Will take all medications as scheduled. Will monitor BP and record readings. Will adhere to recommended cardiac prudent/diabetic diet. Will follow recommended safety precautions to prevent falls and injuries.  Over the next 30 days, patient: Follow up with the surgical team as scheduled regarding colonoscopy.   Interventions:  Inter-disciplinary care team collaboration (see longitudinal plan of care)   Reviewed compliance with plan for diabetes management. Reports  attempting to adhere to recommended plan. She continues attempting to manage with medications and diet. Prefers not to monitor blood glucose levels daily. We discussed current nutrition along with heart healthy/diabetic friendly food options. Agreed to update the team if additional educational resources are needed.   Provided information regarding safety and fall prevention measures. Encourage to use caution when ambulating. Advised to keep pathways clear and well lit. Reports  ambulating well. Attempts to light walks daily. Denies recent falls.   Reviewed scheduled/pending appointments. Encouraged to attend medical appointments as scheduled to prevent delays in care. Encouraged to notify care management team with concerns regarding transportation. Pending outreach with the surgical team on 06/08/20 to discuss plan/schedule a colonoscopy. Denies concerns regarding transportation.   Discussed plans for ongoing care management follow up. Provided direct contact information for CCM Nurse Case Manager.   Patient Self Care Activities:  Self administers medications Attends scheduled provider appointments Calls pharmacy for medication refills Attends church or other social activities Performs ADL's independently Performs IADL's independently Calls provider office for new concerns or questions  Initial goal documentation      Exercise 3x per week (30 min per time)     Recommend to start exercising 3 days a week for 30 minutes.         Immunization History  Administered Date(s) Administered   Fluad Quad(high Dose 65+) 05/17/2020, 05/02/2021, 05/26/2022   Fluad Trivalent(High Dose 65+) 04/30/2023   Influenza, High Dose Seasonal PF 06/11/2017, 06/04/2018   Influenza-Unspecified 05/22/2019   PFIZER(Purple Top)SARS-COV-2 Vaccination 10/01/2019, 10/22/2019   Pneumococcal Conjugate-13 02/07/2016   Pneumococcal Polysaccharide-23 06/11/2017   Tdap 12/04/2011   Zoster Recombinant(Shingrix) 04/16/2021   Zoster, Live 09/11/2013    Health Maintenance  Topic Date Due   COVID-19 Vaccine (3 - Pfizer risk series) 11/19/2019   Zoster Vaccines- Shingrix (2 of 2) 06/11/2021   DTaP/Tdap/Td (2 - Td or Tdap) 12/03/2021   OPHTHALMOLOGY EXAM  04/26/2023   HEMOGLOBIN A1C  06/30/2023   Fecal DNA (Cologuard)  07/10/2023   Diabetic kidney evaluation - eGFR measurement  12/29/2023   Diabetic kidney evaluation - Urine ACR  12/29/2023   Medicare Annual Wellness (AWV)  04/29/2024    MAMMOGRAM  02/15/2025   Pneumonia Vaccine 7+ Years old  Completed   INFLUENZA VACCINE  Completed   DEXA SCAN  Completed   Hepatitis C Screening  Completed   HPV VACCINES  Aged Out    Discussed health benefits of physical activity, and encouraged her to engage in regular exercise appropriate for her age and condition.     Restless Leg Syndrome Patient reports discontinuation of gabapentin due to cost. Discussed the option of switching to a less expensive tablet formulation. Is also likely exacerbated by iron deficiency as below.  -Switch gabapentin to less expensive generic tablet formulation and resume treatment.  Allergic Rhinitis Patient reports needing to resume allergy medications (cetirizine and Flonase) due to symptoms. -Resume cetirizine and Flonase.  Hypertension Elevated blood pressure noted during visit. Patient reports inconsistent dosing of valsartan HCTZ. -Increase valsartan HCTZ to full tablet daily.  Iron Deficiency Patient reports poor compliance with iron supplement due to constipation. Discussed the option of taking the supplement every other day. -Check iron levels. -If levels are low, advise patient to take iron supplement every other day.  Anxiety Patient reports shortness of breath during periods of anxiety. Currently on fluoxetine. -Continue fluoxetine, monitor response.  Diabetes -Order tetanus shot, provide prescription for administration at pharmacy. -Check A1C. -Advise patient to monitor blood pressure and blood sugar at home.             Mila Merry, MD  [facility] [phone] (phone) [phone] (fax)  [facility]

## 2023-05-01 LAB — IRON,TIBC AND FERRITIN PANEL
Ferritin: 9 ng/mL — ABNORMAL LOW (ref 15–150)
Iron Saturation: 8 % — CL (ref 15–55)
Iron: 41 ug/dL (ref 27–139)
Total Iron Binding Capacity: 504 ug/dL — ABNORMAL HIGH (ref 250–450)
UIBC: 463 ug/dL — ABNORMAL HIGH (ref 118–369)

## 2023-05-01 LAB — HEMOGLOBIN AND HEMATOCRIT, BLOOD
Hematocrit: 32.5 % — ABNORMAL LOW (ref 34.0–46.6)
Hemoglobin: 10.2 g/dL — ABNORMAL LOW (ref 11.1–15.9)

## 2023-05-01 LAB — HEMOGLOBIN A1C
Est. average glucose Bld gHb Est-mCnc: 171 mg/dL
Hgb A1c MFr Bld: 7.6 % — ABNORMAL HIGH (ref 4.8–5.6)

## 2023-06-12 ENCOUNTER — Other Ambulatory Visit: Payer: Self-pay | Admitting: Family Medicine

## 2023-07-27 ENCOUNTER — Other Ambulatory Visit: Payer: Self-pay | Admitting: Family Medicine

## 2023-07-30 NOTE — Telephone Encounter (Signed)
Please advise 

## 2023-08-04 ENCOUNTER — Other Ambulatory Visit: Payer: Self-pay | Admitting: Family Medicine

## 2023-08-04 DIAGNOSIS — I10 Essential (primary) hypertension: Secondary | ICD-10-CM

## 2023-08-07 NOTE — Telephone Encounter (Signed)
 Requested Prescriptions  Pending Prescriptions Disp Refills   pioglitazone  (ACTOS ) 15 MG tablet [Pharmacy Med Name: PIOGLITAZONE  HCL 15 MG TABLET] 90 tablet 0    Sig: TAKE 1 TABLET (15 MG TOTAL) BY MOUTH DAILY.     Endocrinology:  Diabetes - Glitazones - pioglitazone  Passed - 08/07/2023  2:19 PM      Passed - HBA1C is between 0 and 7.9 and within 180 days    Hgb A1c MFr Bld  Date Value Ref Range Status  04/30/2023 7.6 (H) 4.8 - 5.6 % Final    Comment:             Prediabetes: 5.7 - 6.4          Diabetes: >6.4          Glycemic control for adults with diabetes: <7.0          Passed - Valid encounter within last 6 months    Recent Outpatient Visits           3 months ago Annual physical exam   [place] [facility] Hannah Nancyann BRAVO, MD   7 months ago Type 2 diabetes mellitus without complication, without long-term current use of insulin (HCC)   [place] [facility] Hannah Nancyann BRAVO, MD   11 months ago Type 2 diabetes mellitus without complication, without long-term current use of insulin (HCC)   [place] [facility] Hannah Nancyann BRAVO, MD   1 year ago Type 2 diabetes mellitus without complication, without long-term current use of insulin (HCC)   [place] [facility] Hannah Nancyann BRAVO, MD   1 year ago Sore throat   [place] [facility] Emilio Marseille T, FNP               valsartan -hydrochlorothiazide  (DIOVAN -HCT) 80-12.5 MG tablet [Pharmacy Med Name: VALSARTAN -HCTZ 80-12.5 MG TAB] 90 tablet 0    Sig: TAKE 1 TABLET BY MOUTH EVERY DAY     Cardiovascular: ARB + Diuretic Combos Failed - 08/07/2023  2:19 PM      Failed - K in normal range and within 180 days    Potassium  Date Value Ref Range Status  12/29/2022 4.7 3.5 - 5.2 mmol/L Final         Failed - Na in normal range and within 180 days    Sodium  Date Value Ref Range Status  12/29/2022 138 134 - 144 mmol/L Final         Failed - Cr  in normal range and within 180 days    Creat  Date Value Ref Range Status  06/13/2017 0.69 0.50 - 0.99 mg/dL Final    Comment:    For patients >14 years of age, the reference limit for Creatinine is approximately 13% higher for people identified as African-American. .    Creatinine, Ser  Date Value Ref Range Status  12/29/2022 0.93 0.57 - 1.00 mg/dL Final   Creatinine,U  Date Value Ref Range Status  02/07/2016 188.2 mg/dL Final         Failed - eGFR is 10 or above and within 180 days    GFR, Est African American  Date Value Ref Range Status  06/13/2017 105 > OR = 60 mL/min/1.80m2 Final   GFR calc Af Amer  Date Value Ref Range Status  03/17/2020 >60 >60 mL/min Final   GFR, Est Non African American  Date Value Ref Range Status  06/13/2017 91 > OR = 60 mL/min/1.17m2 Final  GFR, Estimated  Date Value Ref Range Status  09/17/2020 >60 >60 mL/min Final    Comment:    (NOTE) Calculated using the CKD-EPI Creatinine Equation (2021)    GFR  Date Value Ref Range Status  02/07/2016 70.31 >60.00 mL/min Final   eGFR  Date Value Ref Range Status  12/29/2022 65 >59 mL/min/1.73 Final         Failed - Last BP in normal range    BP Readings from Last 1 Encounters:  04/30/23 (!) 180/84         Passed - Patient is not pregnant      Passed - Valid encounter within last 6 months    Recent Outpatient Visits           3 months ago Annual physical exam   [place] [facility] Hannah Nancyann BRAVO, MD   7 months ago Type 2 diabetes mellitus without complication, without long-term current use of insulin (HCC)   [place] [facility] Hannah Nancyann BRAVO, MD   11 months ago Type 2 diabetes mellitus without complication, without long-term current use of insulin (HCC)   [place] [facility] Hannah Nancyann BRAVO, MD   1 year ago Type 2 diabetes mellitus without complication, without long-term current use of insulin (HCC)   [place]  [facility] Hannah Nancyann BRAVO, MD   1 year ago Sore throat   [place] [facility] Emilio Kelly DASEN, [place]

## 2023-08-07 NOTE — Telephone Encounter (Signed)
 Requested medication (s) are due for refill today: yes  Requested medication (s) are on the active medication list: yes  Last refill:  03/16/23 #90/0  Future visit scheduled: no  Notes to clinic:  Unable to refill per protocol due to failed labs, no updated results.      Requested Prescriptions  Pending Prescriptions Disp Refills   valsartan -hydrochlorothiazide  (DIOVAN -HCT) 80-12.5 MG tablet [Pharmacy Med Name: VALSARTAN -HCTZ 80-12.5 MG TAB] 90 tablet 0    Sig: TAKE 1 TABLET BY MOUTH EVERY DAY     Cardiovascular: ARB + Diuretic Combos Failed - 08/07/2023  2:19 PM      Failed - K in normal range and within 180 days    Potassium  Date Value Ref Range Status  12/29/2022 4.7 3.5 - 5.2 mmol/L Final         Failed - Na in normal range and within 180 days    Sodium  Date Value Ref Range Status  12/29/2022 138 134 - 144 mmol/L Final         Failed - Cr in normal range and within 180 days    Creat  Date Value Ref Range Status  06/13/2017 0.69 0.50 - 0.99 mg/dL Final    Comment:    For patients >30 years of age, the reference limit for Creatinine is approximately 13% higher for people identified as African-American. .    Creatinine, Ser  Date Value Ref Range Status  12/29/2022 0.93 0.57 - 1.00 mg/dL Final   Creatinine,U  Date Value Ref Range Status  02/07/2016 188.2 mg/dL Final         Failed - eGFR is 10 or above and within 180 days    GFR, Est African American  Date Value Ref Range Status  06/13/2017 105 > OR = 60 mL/min/1.63m2 Final   GFR calc Af Amer  Date Value Ref Range Status  03/17/2020 >60 >60 mL/min Final   GFR, Est Non African American  Date Value Ref Range Status  06/13/2017 91 > OR = 60 mL/min/1.49m2 Final   GFR, Estimated  Date Value Ref Range Status  09/17/2020 >60 >60 mL/min Final    Comment:    (NOTE) Calculated using the CKD-EPI Creatinine Equation (2021)    GFR  Date Value Ref Range Status  02/07/2016 70.31 >60.00 mL/min Final   eGFR   Date Value Ref Range Status  12/29/2022 65 >59 mL/min/1.73 Final         Failed - Last BP in normal range    BP Readings from Last 1 Encounters:  04/30/23 (!) 180/84         Passed - Patient is not pregnant      Passed - Valid encounter within last 6 months    Recent Outpatient Visits           3 months ago Annual physical exam   [place] [facility] Gasper Nancyann BRAVO, MD   7 months ago Type 2 diabetes mellitus without complication, without long-term current use of insulin (HCC)   [place] [facility] Gasper Nancyann BRAVO, MD   11 months ago Type 2 diabetes mellitus without complication, without long-term current use of insulin (HCC)   [place] [facility] Gasper Nancyann BRAVO, MD   1 year ago Type 2 diabetes mellitus without complication, without long-term current use of insulin (HCC)   [place] [facility] Gasper Nancyann BRAVO, MD   1 year ago Sore throat   [facility] Emilio Marseille T, FNP              Signed Prescriptions Disp Refills   pioglitazone  (ACTOS ) 15 MG tablet 90 tablet 0    Sig: TAKE 1 TABLET (15 MG TOTAL) BY MOUTH DAILY.     Endocrinology:  Diabetes - Glitazones - pioglitazone  Passed - 08/07/2023  2:19 PM      Passed - HBA1C is between 0 and 7.9 and within 180 days    Hgb A1c MFr Bld  Date Value Ref Range Status  04/30/2023 7.6 (H) 4.8 - 5.6 % Final    Comment:             Prediabetes: 5.7 - 6.4          Diabetes: >6.4          Glycemic control for adults with diabetes: <7.0          Passed - Valid encounter within last 6 months    Recent Outpatient Visits           3 months ago Annual physical exam   [facility] Gasper Nancyann BRAVO, MD   7 months ago Type 2 diabetes mellitus without complication, without long-term current use of insulin (HCC)   [place] [facility] Gasper Nancyann BRAVO, MD   11 months ago Type 2 diabetes  mellitus without complication, without long-term current use of insulin (HCC)   [place] [facility] Gasper Nancyann BRAVO, MD   1 year ago Type 2 diabetes mellitus without complication, without long-term current use of insulin (HCC)   [place] [facility] Gasper Nancyann BRAVO, MD   1 year ago Sore throat   [place] [facility] Emilio Marseille DASEN, [place]

## 2023-09-28 ENCOUNTER — Other Ambulatory Visit: Payer: Self-pay | Admitting: Family Medicine

## 2023-09-28 DIAGNOSIS — C50911 Malignant neoplasm of unspecified site of right female breast: Secondary | ICD-10-CM

## 2023-09-28 DIAGNOSIS — K219 Gastro-esophageal reflux disease without esophagitis: Secondary | ICD-10-CM

## 2023-10-05 DIAGNOSIS — E119 Type 2 diabetes mellitus without complications: Secondary | ICD-10-CM | POA: Diagnosis not present

## 2023-10-05 DIAGNOSIS — H04123 Dry eye syndrome of bilateral lacrimal glands: Secondary | ICD-10-CM | POA: Diagnosis not present

## 2023-10-05 DIAGNOSIS — Z9841 Cataract extraction status, right eye: Secondary | ICD-10-CM | POA: Diagnosis not present

## 2023-10-05 DIAGNOSIS — H0288A Meibomian gland dysfunction right eye, upper and lower eyelids: Secondary | ICD-10-CM | POA: Diagnosis not present

## 2023-10-05 DIAGNOSIS — Z9842 Cataract extraction status, left eye: Secondary | ICD-10-CM | POA: Diagnosis not present

## 2023-10-05 DIAGNOSIS — H52223 Regular astigmatism, bilateral: Secondary | ICD-10-CM | POA: Diagnosis not present

## 2023-10-05 DIAGNOSIS — H0288B Meibomian gland dysfunction left eye, upper and lower eyelids: Secondary | ICD-10-CM | POA: Diagnosis not present

## 2023-10-05 LAB — HM DIABETES EYE EXAM

## 2023-10-10 ENCOUNTER — Encounter: Payer: Self-pay | Admitting: Family Medicine

## 2023-10-10 ENCOUNTER — Ambulatory Visit: Payer: Medicare HMO | Admitting: Family Medicine

## 2023-10-10 VITALS — BP 142/73 | HR 74 | Temp 97.2°F | Resp 16 | Ht 60.0 in | Wt 145.7 lb

## 2023-10-10 DIAGNOSIS — E1169 Type 2 diabetes mellitus with other specified complication: Secondary | ICD-10-CM

## 2023-10-10 DIAGNOSIS — E785 Hyperlipidemia, unspecified: Secondary | ICD-10-CM

## 2023-10-10 DIAGNOSIS — I10 Essential (primary) hypertension: Secondary | ICD-10-CM

## 2023-10-10 DIAGNOSIS — E119 Type 2 diabetes mellitus without complications: Secondary | ICD-10-CM

## 2023-10-10 DIAGNOSIS — Z7984 Long term (current) use of oral hypoglycemic drugs: Secondary | ICD-10-CM | POA: Diagnosis not present

## 2023-10-10 DIAGNOSIS — D508 Other iron deficiency anemias: Secondary | ICD-10-CM

## 2023-10-10 LAB — POCT GLYCOSYLATED HEMOGLOBIN (HGB A1C): Hemoglobin A1C: 7.5 % — AB (ref 4.0–5.6)

## 2023-10-10 NOTE — Patient Instructions (Signed)
 Hannah [name]  Please review the attached list of medications and notify my office if there are any errors.   . Please bring all of your medications to every appointment so we can make sure that our medication list is the same as yours.

## 2023-10-10 NOTE — Progress Notes (Signed)
 Established patient visit   Patient: Hannah [name]   DOB: Apr 14, 1951   73 y.o. Female  MRN: [id number] Visit Date: 10/10/2023  Today's healthcare provider: Mila Merry, MD   Chief Complaint  Patient presents with   Diabetes    DM f/u and labs, refill on Zyrtec. Pt states she didn't take bp meds this morning.   Subjective    Diabetes Pertinent negatives for hypoglycemia include no dizziness. Pertinent negatives for diabetes include no chest pain, no fatigue and no weakness.   Follow up dmt, htn, lipids, and iron deficiency anemia. Feels well. Taking meds consistently, but only taking iron supplement about once a week due to constipation.   Lab Results  Component Value Date   IRON 41 04/30/2023   TIBC 504 (H) 04/30/2023   FERRITIN 9 (L) 04/30/2023   Lab Results  Component Value Date   WBC 6.6 12/29/2022   HGB 10.2 (L) 04/30/2023   HCT 32.5 (L) 04/30/2023   MCV 86 12/29/2022   PLT 501 (H) 12/29/2022   Lab Results  Component Value Date   CHOL 149 12/29/2022   HDL 81 12/29/2022   LDLCALC 55 12/29/2022   LDLDIRECT 74.0 02/07/2016   TRIG 62 12/29/2022   CHOLHDL 1.8 12/29/2022   Lab Results  Component Value Date   NA 138 12/29/2022   K 4.7 12/29/2022   CREATININE 0.93 12/29/2022   EGFR 65 12/29/2022   GLUCOSE 154 (H) 12/29/2022     Medications: Outpatient Medications Prior to Visit  Medication Sig   carvedilol (COREG) 12.5 MG tablet TAKE 1 TABLET BY MOUTH TWICE A DAY   cetirizine (ZYRTEC) 10 MG tablet Take 1 tablet (10 mg total) by mouth daily.   FLUoxetine (PROZAC) 40 MG capsule TAKE 1 CAPSULE BY MOUTH EVERY DAY   fluticasone (FLONASE) 50 MCG/ACT nasal spray Place 2 sprays into both nostrils daily.   gabapentin (NEURONTIN) 600 MG tablet Take 1 tablet (600 mg total) by mouth at bedtime.   glipiZIDE (GLUCOTROL XL) 5 MG 24 hr tablet TAKE 1 TABLET BY MOUTH EVERY DAY WITH BREAKFAST   Iron-Vitamin C 65-125 MG TABS Take 1 tablet by mouth once a week.    metFORMIN (GLUCOPHAGE) 1000 MG tablet TAKE 1 TABLET BY MOUTH TWICE A DAY FOR DIABETES   montelukast (SINGULAIR) 10 MG tablet TAKE 1 TABLET BY MOUTH EVERYDAY AT BEDTIME   omeprazole (PRILOSEC) 20 MG capsule TAKE 1 CAPSULE BY MOUTH EVERY DAY   pioglitazone (ACTOS) 15 MG tablet TAKE 1 TABLET (15 MG TOTAL) BY MOUTH DAILY.   simvastatin (ZOCOR) 40 MG tablet TAKE 1 TABLET BY MOUTH EVERYDAY AT BEDTIME   valsartan-hydrochlorothiazide (DIOVAN-HCT) 80-12.5 MG tablet TAKE 1 TABLET BY MOUTH EVERY DAY   No facility-administered medications prior to visit.    Review of Systems  Constitutional:  Negative for appetite change, chills, fatigue and fever.  Respiratory:  Negative for chest tightness and shortness of breath.   Cardiovascular:  Negative for chest pain and palpitations.  Gastrointestinal:  Negative for abdominal pain, nausea and vomiting.  Neurological:  Negative for dizziness and weakness.       Objective    BP (!) 142/73 (BP Location: Left Arm, Patient Position: Sitting)   Pulse 74   Temp (!) 97.2 F (36.2 C) (Oral)   Resp 16   Ht 5' (1.524 m)   Wt 145 lb 11.2 oz (66.1 kg)   LMP 08/30/2003   SpO2 97%   BMI 28.46 kg/m  Physical Exam  General appearance: Well developed, well nourished female, cooperative and in no acute distress Head: Normocephalic, without obvious abnormality, atraumatic Respiratory: Respirations even and unlabored, normal respiratory rate Extremities: All extremities are intact.  Skin: Skin color, texture, turgor normal. No rashes seen  Psych: Appropriate mood and affect. Neurologic: Mental status: Alert, oriented to person, place, and time, thought content appropriate.   A1c=7.5%   Assessment & Plan     1. Type 2 diabetes mellitus without complication, without long-term current use of insulin (HCC) (Primary) Fairly well controlled. Has some room to improve diet by reducing sweets, and plans on starting back on regular walking routine 2-3 days a week  with nicer weather.  - Comprehensive metabolic panel  2. Other iron deficiency anemia Only tolerating iron supplement once a week. Normal colonoscopy in 2022 - Iron, TIBC and Ferritin Panel - Ferritin - CBC  3. Hyperlipidemia, unspecified hyperlipidemia type She is tolerating simvastatin well with no adverse effects.   - Lipid panel  4. Primary hypertension Slightly above goal, but did not take medications this am.          Mila Merry, MD  [facility] [phone] (phone) [phone] (fax)  [facility]

## 2023-10-11 ENCOUNTER — Telehealth: Payer: Self-pay

## 2023-10-11 LAB — COMPREHENSIVE METABOLIC PANEL
ALT: 11 IU/L (ref 0–32)
AST: 12 IU/L (ref 0–40)
Albumin: 4.4 g/dL (ref 3.8–4.8)
Alkaline Phosphatase: 62 IU/L (ref 44–121)
BUN/Creatinine Ratio: 16 (ref 12–28)
BUN: 13 mg/dL (ref 8–27)
Bilirubin Total: 0.4 mg/dL (ref 0.0–1.2)
CO2: 23 mmol/L (ref 20–29)
Calcium: 9.8 mg/dL (ref 8.7–10.3)
Chloride: 100 mmol/L (ref 96–106)
Creatinine, Ser: 0.82 mg/dL (ref 0.57–1.00)
Globulin, Total: 2.4 g/dL (ref 1.5–4.5)
Glucose: 153 mg/dL — ABNORMAL HIGH (ref 70–99)
Potassium: 4.5 mmol/L (ref 3.5–5.2)
Sodium: 140 mmol/L (ref 134–144)
Total Protein: 6.8 g/dL (ref 6.0–8.5)
eGFR: 75 mL/min/{1.73_m2} (ref >59)

## 2023-10-11 LAB — LIPID PANEL
Chol/HDL Ratio: 2.1 ratio (ref 0.0–4.4)
Cholesterol, Total: 180 mg/dL (ref 100–199)
HDL: 85 mg/dL (ref >39)
LDL Chol Calc (NIH): 81 mg/dL (ref 0–99)
Triglycerides: 78 mg/dL (ref 0–149)
VLDL Cholesterol Cal: 14 mg/dL (ref 5–40)

## 2023-10-11 LAB — CBC
Hematocrit: 32.6 % — ABNORMAL LOW (ref 34.0–46.6)
Hemoglobin: 10.4 g/dL — ABNORMAL LOW (ref 11.1–15.9)
MCH: 27.4 pg (ref 26.6–33.0)
MCHC: 31.9 g/dL (ref 31.5–35.7)
MCV: 86 fL (ref 79–97)
Platelets: 477 10*3/uL — ABNORMAL HIGH (ref 150–450)
RBC: 3.8 x10E6/uL (ref 3.77–5.28)
RDW: 13.7 % (ref 11.7–15.4)
WBC: 6.3 10*3/uL (ref 3.4–10.8)

## 2023-10-11 LAB — IRON,TIBC AND FERRITIN PANEL
Ferritin: 10 ng/mL — ABNORMAL LOW (ref 15–150)
Iron Saturation: 9 % — CL (ref 15–55)
Iron: 48 ug/dL (ref 27–139)
Total Iron Binding Capacity: 542 ug/dL — ABNORMAL HIGH (ref 250–450)
UIBC: 494 ug/dL — ABNORMAL HIGH (ref 118–369)

## 2023-10-11 NOTE — Telephone Encounter (Unsigned)
 Copied from CRM [id number]. Topic: Clinical - Lab/Test Results >> Oct 11, 2023 12:35 PM Fuller Mandril wrote: Reason for CRM: Patient called wanted lab results. Does not have access to MyChart. Advised has not been reviewed by provider yet but once reviewed and nutated someone will contact patient. Thank You

## 2023-10-12 NOTE — Telephone Encounter (Signed)
 Please see result note regarding low iron

## 2023-10-12 NOTE — Telephone Encounter (Signed)
 Copied from CRM [id number]. Topic: Clinical - Lab/Test Results >> Oct 12, 2023 12:43 PM Clayton Bibles wrote: Reason for CRM:  I read lab results that doctor wrote on 10/12/23. Dailee did not have any questions so no call back is needed.

## 2023-12-11 DIAGNOSIS — H43811 Vitreous degeneration, right eye: Secondary | ICD-10-CM | POA: Diagnosis not present

## 2024-01-07 ENCOUNTER — Other Ambulatory Visit: Payer: Self-pay

## 2024-01-07 ENCOUNTER — Ambulatory Visit: Payer: Self-pay

## 2024-01-07 ENCOUNTER — Emergency Department
Admission: EM | Admit: 2024-01-07 | Discharge: 2024-01-07 | Disposition: A | Discharge status: Home / Self Care | Attending: Emergency Medicine | Admitting: Emergency Medicine

## 2024-01-07 DIAGNOSIS — I10 Essential (primary) hypertension: Secondary | ICD-10-CM | POA: Insufficient documentation

## 2024-01-07 DIAGNOSIS — N179 Acute kidney failure, unspecified: Secondary | ICD-10-CM | POA: Insufficient documentation

## 2024-01-07 DIAGNOSIS — E860 Dehydration: Secondary | ICD-10-CM | POA: Diagnosis not present

## 2024-01-07 DIAGNOSIS — E119 Type 2 diabetes mellitus without complications: Secondary | ICD-10-CM | POA: Insufficient documentation

## 2024-01-07 DIAGNOSIS — N3001 Acute cystitis with hematuria: Secondary | ICD-10-CM | POA: Diagnosis not present

## 2024-01-07 DIAGNOSIS — R42 Dizziness and giddiness: Secondary | ICD-10-CM | POA: Diagnosis present

## 2024-01-07 LAB — MAGNESIUM: Magnesium: 1.8 mg/dL (ref 1.7–2.4)

## 2024-01-07 LAB — CBC WITH DIFFERENTIAL/PLATELET
Abs Immature Granulocytes: 0.02 10*3/uL (ref 0.00–0.07)
Basophils Absolute: 0.1 10*3/uL (ref 0.0–0.1)
Basophils Relative: 1 %
Eosinophils Absolute: 0.2 10*3/uL (ref 0.0–0.5)
Eosinophils Relative: 3 %
HCT: 32.3 % — ABNORMAL LOW (ref 36.0–46.0)
Hemoglobin: 10 g/dL — ABNORMAL LOW (ref 12.0–15.0)
Immature Granulocytes: 0 %
Lymphocytes Relative: 29 %
Lymphs Abs: 2.4 10*3/uL (ref 0.7–4.0)
MCH: 27.1 pg (ref 26.0–34.0)
MCHC: 31 g/dL (ref 30.0–36.0)
MCV: 87.5 fL (ref 80.0–100.0)
Monocytes Absolute: 0.7 10*3/uL (ref 0.1–1.0)
Monocytes Relative: 9 %
Neutro Abs: 4.8 10*3/uL (ref 1.7–7.7)
Neutrophils Relative %: 58 %
Platelets: 462 10*3/uL — ABNORMAL HIGH (ref 150–400)
RBC: 3.69 MIL/uL — ABNORMAL LOW (ref 3.87–5.11)
RDW: 14.6 % (ref 11.5–15.5)
WBC: 8.2 10*3/uL (ref 4.0–10.5)
nRBC: 0 % (ref 0.0–0.2)

## 2024-01-07 LAB — URINALYSIS, ROUTINE W REFLEX MICROSCOPIC
Bilirubin Urine: NEGATIVE
Glucose, UA: 50 mg/dL — AB
Hgb urine dipstick: NEGATIVE
Ketones, ur: NEGATIVE mg/dL
Nitrite: NEGATIVE
Protein, ur: 100 mg/dL — AB
Specific Gravity, Urine: 1.024 (ref 1.005–1.030)
WBC, UA: >50 WBC/hpf (ref 0–5)
pH: 5 (ref 5.0–8.0)

## 2024-01-07 LAB — COMPREHENSIVE METABOLIC PANEL WITH GFR
ALT: 35 U/L (ref 0–44)
AST: 28 U/L (ref 15–41)
Albumin: 3.8 g/dL (ref 3.5–5.0)
Alkaline Phosphatase: 93 U/L (ref 38–126)
Anion gap: 11 (ref 5–15)
BUN: 28 mg/dL — ABNORMAL HIGH (ref 8–23)
CO2: 21 mmol/L — ABNORMAL LOW (ref 22–32)
Calcium: 8.8 mg/dL — ABNORMAL LOW (ref 8.9–10.3)
Chloride: 104 mmol/L (ref 98–111)
Creatinine, Ser: 1.54 mg/dL — ABNORMAL HIGH (ref 0.44–1.00)
GFR, Estimated: 35 mL/min — ABNORMAL LOW (ref >60)
Glucose, Bld: 165 mg/dL — ABNORMAL HIGH (ref 70–99)
Potassium: 4 mmol/L (ref 3.5–5.1)
Sodium: 136 mmol/L (ref 135–145)
Total Bilirubin: 0.4 mg/dL (ref 0.0–1.2)
Total Protein: 6.8 g/dL (ref 6.5–8.1)

## 2024-01-07 LAB — TROPONIN I (HIGH SENSITIVITY): Troponin I (High Sensitivity): 3 ng/L (ref <18)

## 2024-01-07 MED ORDER — SODIUM CHLORIDE 0.9 % IV BOLUS
1000.0000 mL | Freq: Once | INTRAVENOUS | Status: AC
  Administered 2024-01-07: 1000 mL via INTRAVENOUS

## 2024-01-07 MED ORDER — CEPHALEXIN 500 MG PO CAPS
500.0000 mg | ORAL_CAPSULE | Freq: Once | ORAL | Status: AC
  Administered 2024-01-07: 500 mg via ORAL
  Filled 2024-01-07: qty 1

## 2024-01-07 MED ORDER — CEFUROXIME AXETIL 500 MG PO TABS: 500.0000 mg | ORAL_TABLET | Freq: Two times a day (BID) | ORAL | 0 refills | Status: AC

## 2024-01-07 NOTE — Discharge Instructions (Addendum)
 Your urine today shows signs of infection and your laboratory work shows signs of dehydration likely causing your lightheaded symptoms.  Please make sure you are staying hydrated at home with significant quantities of fluids.  I have also sent an antibiotic to your pharmacy to help clear up your UTI.  Please follow-up with your primary care provider in a week for reassessment to make sure your kidney function is improving.  Return for any other severe or worsening symptoms.

## 2024-01-07 NOTE — ED Notes (Signed)
 Pt was assisted to the restroom. Pt denies dizziness at this time

## 2024-01-07 NOTE — ED Triage Notes (Signed)
 P presented to [facility] clinic for UTI sx and dizziness. Pt sent over here due to low BP. Pt reports she had dysuria over the weekend but has improved after taking AZO.

## 2024-01-07 NOTE — ED Provider Notes (Signed)
 [facility] Provider Note    Event Date/Time   First MD Initiated Contact with Patient 01/07/24 1909     (approximate)   History   Dizziness   HPI Hannah [name] is a 73 y.o. female with history of HTN, DM 2, HLD presenting today for lightheadedness.  Patient states over the past several days she has had intermittent episodes of lightheadedness.  No true passing out.  She states she felt like he had a UTI at 1 point last week and treated it with over-the-counter Azo with symptomatic improvement.  She otherwise denies any chest pain, shortness of breath, nausea, vomiting, abdominal pain, dizziness with the episodes.  Mostly occurs when she first stands up or when she has been out in the heat.  Admits that she has not had great fluid intake over the past week.     Physical Exam   Triage Vital Signs: ED Triage Vitals  Encounter Vitals Group     BP 01/07/24 1653 103/61     Systolic BP Percentile --      Diastolic BP Percentile --      Pulse Rate 01/07/24 1653 86     Resp 01/07/24 1653 16     Temp 01/07/24 1652 98.7 F (37.1 C)     Temp Source 01/07/24 1652 Oral     SpO2 01/07/24 1653 92 %     Weight 01/07/24 1654 143 lb (64.9 kg)     Height 01/07/24 1654 5' (1.524 m)     Head Circumference --      Peak Flow --      Pain Score 01/07/24 1654 0     Pain Loc --      Pain Education --      Exclude from Growth Chart --     Most recent vital signs: Vitals:   01/07/24 1917 01/07/24 1930  BP: 129/65 129/61  Pulse:  72  Resp: 17 (!) 23  Temp:    SpO2: 98% 95%   Physical Exam: I have reviewed the vital signs and nursing notes. General: Awake, alert, no acute distress.  Nontoxic appearing. Head:  Atraumatic, normocephalic.   ENT:  EOM intact, PERRL. Oral mucosa is pink and moist with no lesions. Neck: Neck is supple with full range of motion, No meningeal signs. Cardiovascular:  RRR, No murmurs. Peripheral pulses palpable and equal  bilaterally. Respiratory:  Symmetrical chest wall expansion.  No rhonchi, rales, or wheezes.  Good air movement throughout.  No use of accessory muscles.   Musculoskeletal:  No cyanosis or edema. Moving extremities with full ROM Abdomen:  Soft, nontender, nondistended. Neuro:  GCS 15, moving all four extremities, interacting appropriately. Speech clear. Psych:  Calm, appropriate.   Skin:  Warm, dry, no rash.    ED Results / Procedures / Treatments   Labs (all labs ordered are listed, but only abnormal results are displayed) Labs Reviewed  CBC WITH DIFFERENTIAL/PLATELET - Abnormal; Notable for the following components:      Result Value   RBC 3.69 (*)    Hemoglobin 10.0 (*)    HCT 32.3 (*)    Platelets 462 (*)    All other components within normal limits  COMPREHENSIVE METABOLIC PANEL WITH GFR - Abnormal; Notable for the following components:   CO2 21 (*)    Glucose, Bld 165 (*)    BUN 28 (*)    Creatinine, Ser 1.54 (*)    Calcium 8.8 (*)    GFR, Estimated  35 (*)    All other components within normal limits  URINALYSIS, ROUTINE W REFLEX MICROSCOPIC - Abnormal; Notable for the following components:   Color, Urine AMBER (*)    APPearance CLOUDY (*)    Glucose, UA 50 (*)    Protein, ur 100 (*)    Leukocytes,Ua LARGE (*)    Bacteria, UA RARE (*)    Non Squamous Epithelial PRESENT (*)    All other components within normal limits  MAGNESIUM  TROPONIN I (HIGH SENSITIVITY)     EKG My EKG interpretation: Rate of 83, normal sinus rhythm, normal axis, normal intervals.  No acute ST elevations or depressions   RADIOLOGY    PROCEDURES:  Critical Care performed: No  Procedures   MEDICATIONS ORDERED IN ED: Medications  cephALEXin  (KEFLEX ) capsule 500 mg (has no administration in time range)  sodium chloride  0.9 % bolus 1,000 mL (1,000 mLs Intravenous New Bag/Given 01/07/24 1937)     IMPRESSION / MDM / ASSESSMENT AND PLAN / ED COURSE  I reviewed the triage vital signs  and the nursing notes.                              Differential diagnosis includes, but is not limited to, dehydration, AKI, electrolyte abnormality, cardiac arrhythmia, lower concern for ACS  Patient's presentation is most consistent with acute complicated illness / injury requiring diagnostic workup.  Patient is a 73 year old female presenting today for lightheaded spells which occur mostly when standing.  Vital signs and physical exam otherwise unremarkable.  Patient does admit to limited fluid intake and has been out in the heat frequently.  CBC with her baseline anemia.  Troponin and magnesium normal.  Most notable her CMP does show new AKI with creatinine 1.54 and elevated BUN likely indicating dehydration.  EKG unremarkable.  Patient was given 1 L of fluids here in the ED. UA does show signs of a UTI.  Patient was otherwise reassessed and continues to be asymptomatic here.  Will discharge her at this time with Ceftin for her UTI and recommended increase in her hydration with fluid intake.  She is agreeable with this plan and will follow-up with her PCP in 1 week for creatinine recheck.  Given strict return precautions.     FINAL CLINICAL IMPRESSION(S) / ED DIAGNOSES   Final diagnoses:  Acute cystitis with hematuria  Dehydration  AKI (acute kidney injury) (HCC)     Rx / DC Orders   ED Discharge Orders          Ordered    cefUROXime (CEFTIN) 500 MG tablet  2 times daily with meals        01/07/24 2053             Note:  This document was prepared using Dragon voice recognition software and may include unintentional dictation errors.   Kandee Orion, MD 01/07/24 [phone]

## 2024-01-07 NOTE — ED Notes (Signed)
 Pt changed to a high fall risk due to her getting dizzy upon standing and feels like she's going to pass out

## 2024-01-07 NOTE — ED Notes (Signed)
 To ED from [facility], dizziness and lightheaded for 2 days. No SOB or CP. 92/62 at [facility]. Dizziness worse in  AM or with exertion.

## 2024-01-07 NOTE — Telephone Encounter (Signed)
 FYI Only or Action Required?: FYI only for provider  Patient was last seen in primary care on 10/10/2023 by Lamon Pillow, MD. Called Nurse Triage reporting Dizziness. Symptoms began several days ago. Interventions attempted: Nothing. Symptoms are: gradually worsening.  Triage Disposition: See Physician Within 24 Hours- Unable to schedule. None available. Referred to ED/UC   Patient/caregiver understands and will follow disposition?: Yes       Copied from CRM [id number]. Topic: Clinical - Red Word Triage >> Jan 07, 2024 11:20 AM Everlene Hobby D wrote: Feeling dizzy and lightheaded all weekend and doesn't know if it's her sugar or blood. Says she had a UTI last week and says she thinks it better. Reason for Disposition . [1] MODERATE dizziness (e.g., interferes with normal activities) AND [2] has NOT been evaluated by doctor (or NP/PA) for this  (Exception: Dizziness caused by heat exposure, sudden standing, or poor fluid intake.)  Answer Assessment - Initial Assessment Questions 1. DESCRIPTION: "Describe your dizziness."     ---- Has been feeling dizzy since Friday. Currently at work during the call.  2. LIGHTHEADED: "Do you feel lightheaded?" (e.g., somewhat faint, woozy, weak upon standing)     ------------------------ Somewhat faint/woozy    3. VERTIGO: "Do you feel like either you or the room is spinning or tilting?" (i.e. vertigo)     -------------------- Denies   4. SEVERITY: "How bad is it?"  "Do you feel like you are going to faint?" "Can you stand and walk?"   - MILD: Feels slightly dizzy, but walking normally.   - MODERATE: Feels unsteady when walking, but not falling; interferes with normal activities (e.g., school, work).   - SEVERE: Unable to walk without falling, or requires assistance to walk without falling; feels like passing out now.      --------------------Mild   5. ONSET:  "When did the dizziness begin?"     --------------- Friday   6. AGGRAVATING FACTORS: "Does  anything make it worse?" (e.g., standing, change in head position)    -------------------- "I don't know"   7. HEART RATE: "Can you tell me your heart rate?" "How many beats in 15 seconds?"  (Note: not all patients can do this)       ------------- Denies   8. CAUSE: "What do you think is causing the dizziness?"     ---------- Unsure. Has HTN and DMII- Pt reports she hasn't check either of these in a long time.   9. RECURRENT SYMPTOM: "Have you had dizziness before?" If Yes, ask: "When was the last time?" "What happened that time?"     -=-----"I dont know"   10. OTHER SYMPTOMS: "Do you have any other symptoms?" (e.g., fever, chest pain, vomiting, diarrhea, bleeding)       -------------------Denies    Additional Info -------- Patient is at work during the call --------- Patient unsure of what HTN meds she takes ----- Currently taking: Metformin   BID  -------  Protocols used: Dizziness - Lightheadedness-A-AH

## 2024-01-07 NOTE — ED Notes (Signed)
 Dizziness for the weekend especially when she stands up. Denies pain or SHOB.

## 2024-01-10 DIAGNOSIS — Z961 Presence of intraocular lens: Secondary | ICD-10-CM | POA: Diagnosis not present

## 2024-01-10 DIAGNOSIS — H02833 Dermatochalasis of right eye, unspecified eyelid: Secondary | ICD-10-CM | POA: Diagnosis not present

## 2024-01-16 NOTE — Progress Notes (Unsigned)
 Established patient visit  Patient: Hannah [name]   DOB: October 21, 1950   73 y.o. Female  MRN: [id number] Visit Date: 01/17/2024  Today's healthcare provider: Blane Bunting, PA-C   No chief complaint on file.  Subjective       Discussed the use of AI scribe software for clinical note transcription with the patient, who gave verbal consent to proceed.  History of Present Illness        04/30/2023    8:31 AM 12/29/2022    9:08 AM 08/25/2022    2:36 PM  Depression screen PHQ 2/9  Decreased Interest 1 0 1  Down, Depressed, Hopeless 1 0 1  PHQ - 2 Score 2 0 2  Altered sleeping 2 1 1   Tired, decreased energy 1 1 0  Change in appetite 0 0 0  Feeling bad or failure about yourself  1 1 1   Trouble concentrating 1 1 0  Moving slowly or fidgety/restless 0 0 0  Suicidal thoughts 0 0 0  PHQ-9 Score 7 4 4   Difficult doing work/chores  Not difficult at all Not difficult at all      04/30/2023    8:31 AM  GAD 7 : Generalized Anxiety Score  Nervous, Anxious, on Edge 2  Control/stop worrying 2  Worry too much - different things 2  Trouble relaxing 1  Restless 1  Easily annoyed or irritable 1  Afraid - awful might happen 1  Total GAD 7 Score 10    Medications: Outpatient Medications Prior to Visit  Medication Sig  . carvedilol  (COREG ) 12.5 MG tablet TAKE 1 TABLET BY MOUTH TWICE A DAY  . cetirizine  (ZYRTEC ) 10 MG tablet Take 1 tablet (10 mg total) by mouth daily.  . FLUoxetine  (PROZAC ) 40 MG capsule TAKE 1 CAPSULE BY MOUTH EVERY DAY  . fluticasone  (FLONASE ) 50 MCG/ACT nasal spray Place 2 sprays into both nostrils daily.  . gabapentin  (NEURONTIN ) 600 MG tablet Take 1 tablet (600 mg total) by mouth at bedtime.  . glipiZIDE  (GLUCOTROL  XL) 5 MG 24 hr tablet TAKE 1 TABLET BY MOUTH EVERY DAY WITH BREAKFAST  . Iron-Vitamin C 65-125 MG TABS Take 1 tablet by mouth once a week.  . metFORMIN  (GLUCOPHAGE ) 1000 MG tablet TAKE 1 TABLET BY MOUTH TWICE A DAY FOR DIABETES  . montelukast   (SINGULAIR ) 10 MG tablet TAKE 1 TABLET BY MOUTH EVERYDAY AT BEDTIME  . omeprazole  (PRILOSEC) 20 MG capsule TAKE 1 CAPSULE BY MOUTH EVERY DAY  . pioglitazone  (ACTOS ) 15 MG tablet TAKE 1 TABLET (15 MG TOTAL) BY MOUTH DAILY.  . simvastatin  (ZOCOR ) 40 MG tablet TAKE 1 TABLET BY MOUTH EVERYDAY AT BEDTIME  . valsartan -hydrochlorothiazide  (DIOVAN -HCT) 80-12.5 MG tablet TAKE 1 TABLET BY MOUTH EVERY DAY   No facility-administered medications prior to visit.    Review of Systems  All other systems reviewed and are negative. All negative Except see HPI   {Insert previous labs (optional):23779} {See past labs  Heme  Chem  Endocrine  Serology  Results Review (optional):1}   Objective    LMP 08/30/2003  {Insert last BP/Wt (optional):23777}{See vitals history (optional):1}   Physical Exam Vitals reviewed.  Constitutional:      General: She is not in acute distress.    Appearance: Normal appearance. She is well-developed. She is not diaphoretic.  HENT:     Head: Normocephalic and atraumatic.   Eyes:     General: No scleral icterus.    Conjunctiva/sclera: Conjunctivae normal.   Neck:     Thyroid :  No thyromegaly.   Cardiovascular:     Rate and Rhythm: Normal rate and regular rhythm.     Pulses: Normal pulses.     Heart sounds: Normal heart sounds. No murmur heard. Pulmonary:     Effort: Pulmonary effort is normal. No respiratory distress.     Breath sounds: Normal breath sounds. No wheezing, rhonchi or rales.   Musculoskeletal:     Cervical back: Neck supple.     Right lower leg: No edema.     Left lower leg: No edema.  Lymphadenopathy:     Cervical: No cervical adenopathy.   Skin:    General: Skin is warm and dry.     Findings: No rash.   Neurological:     Mental Status: She is alert and oriented to person, place, and time. Mental status is at baseline.   Psychiatric:        Mood and Affect: Mood normal.        Behavior: Behavior normal.     No results found for  any visits on 01/17/24.      Assessment and Plan Assessment & Plan     No orders of the defined types were placed in this encounter.   No follow-ups on file.   The patient was advised to call back or seek an in-person evaluation if the symptoms worsen or if the condition fails to improve as anticipated.  I discussed the assessment and treatment plan with the patient. The patient was provided an opportunity to ask questions and all were answered. The patient agreed with the plan and demonstrated an understanding of the instructions.  I, Grayce Budden, PA-C have reviewed all documentation for this visit. The documentation on 01/17/2024  for the exam, diagnosis, procedures, and orders are all accurate and complete.  Blane Bunting, [facility], MMS [facility] [phone] (phone) [phone] (fax)  [facility]

## 2024-01-17 ENCOUNTER — Encounter: Payer: Self-pay | Admitting: Physician Assistant

## 2024-01-17 ENCOUNTER — Ambulatory Visit: Admitting: Physician Assistant

## 2024-01-17 VITALS — BP 100/63 | HR 75 | Temp 97.7°F | Resp 16 | Ht 60.0 in | Wt 143.1 lb

## 2024-01-17 DIAGNOSIS — D649 Anemia, unspecified: Secondary | ICD-10-CM | POA: Diagnosis not present

## 2024-01-17 DIAGNOSIS — R319 Hematuria, unspecified: Secondary | ICD-10-CM | POA: Diagnosis not present

## 2024-01-17 DIAGNOSIS — N3000 Acute cystitis without hematuria: Secondary | ICD-10-CM

## 2024-01-17 DIAGNOSIS — Z7984 Long term (current) use of oral hypoglycemic drugs: Secondary | ICD-10-CM

## 2024-01-17 DIAGNOSIS — E119 Type 2 diabetes mellitus without complications: Secondary | ICD-10-CM | POA: Diagnosis not present

## 2024-01-18 ENCOUNTER — Ambulatory Visit: Payer: Self-pay | Admitting: Physician Assistant

## 2024-01-18 ENCOUNTER — Telehealth: Payer: Self-pay

## 2024-01-18 DIAGNOSIS — R319 Hematuria, unspecified: Secondary | ICD-10-CM | POA: Diagnosis not present

## 2024-01-18 DIAGNOSIS — N390 Urinary tract infection, site not specified: Secondary | ICD-10-CM

## 2024-01-18 DIAGNOSIS — E119 Type 2 diabetes mellitus without complications: Secondary | ICD-10-CM | POA: Diagnosis not present

## 2024-01-18 DIAGNOSIS — N3000 Acute cystitis without hematuria: Secondary | ICD-10-CM | POA: Diagnosis not present

## 2024-01-18 LAB — HEMOGLOBIN A1C
Est. average glucose Bld gHb Est-mCnc: 171 mg/dL
Hgb A1c MFr Bld: 7.6 % — ABNORMAL HIGH (ref 4.8–5.6)

## 2024-01-18 LAB — IRON,TIBC AND FERRITIN PANEL
Ferritin: 11 ng/mL — ABNORMAL LOW (ref 15–150)
Iron Saturation: 12 % — ABNORMAL LOW (ref 15–55)
Iron: 58 ug/dL (ref 27–139)
Total Iron Binding Capacity: 474 ug/dL — ABNORMAL HIGH (ref 250–450)
UIBC: 416 ug/dL — ABNORMAL HIGH (ref 118–369)

## 2024-01-18 LAB — CBC WITH DIFFERENTIAL/PLATELET
Basophils Absolute: 0.1 10*3/uL (ref 0.0–0.2)
Basos: 1 %
EOS (ABSOLUTE): 0.2 10*3/uL (ref 0.0–0.4)
Eos: 3 %
Hematocrit: 31.9 % — ABNORMAL LOW (ref 34.0–46.6)
Hemoglobin: 9.7 g/dL — ABNORMAL LOW (ref 11.1–15.9)
Immature Grans (Abs): 0 10*3/uL (ref 0.0–0.1)
Immature Granulocytes: 0 %
Lymphocytes Absolute: 1.8 10*3/uL (ref 0.7–3.1)
Lymphs: 30 %
MCH: 27.1 pg (ref 26.6–33.0)
MCHC: 30.4 g/dL — ABNORMAL LOW (ref 31.5–35.7)
MCV: 89 fL (ref 79–97)
Monocytes Absolute: 0.5 10*3/uL (ref 0.1–0.9)
Monocytes: 8 %
Neutrophils Absolute: 3.5 10*3/uL (ref 1.4–7.0)
Neutrophils: 58 %
Platelets: 431 10*3/uL (ref 150–450)
RBC: 3.58 x10E6/uL — ABNORMAL LOW (ref 3.77–5.28)
RDW: 14.1 % (ref 11.7–15.4)
WBC: 6 10*3/uL (ref 3.4–10.8)

## 2024-01-18 LAB — COMPREHENSIVE METABOLIC PANEL WITH GFR
ALT: 13 IU/L (ref 0–32)
AST: 13 IU/L (ref 0–40)
Albumin: 4.1 g/dL (ref 3.8–4.8)
Alkaline Phosphatase: 65 IU/L (ref 44–121)
BUN/Creatinine Ratio: 21 (ref 12–28)
BUN: 21 mg/dL (ref 8–27)
Bilirubin Total: 0.4 mg/dL (ref 0.0–1.2)
CO2: 19 mmol/L — ABNORMAL LOW (ref 20–29)
Calcium: 9.5 mg/dL (ref 8.7–10.3)
Chloride: 96 mmol/L (ref 96–106)
Creatinine, Ser: 0.98 mg/dL (ref 0.57–1.00)
Globulin, Total: 2.1 g/dL (ref 1.5–4.5)
Glucose: 207 mg/dL — ABNORMAL HIGH (ref 70–99)
Potassium: 4.8 mmol/L (ref 3.5–5.2)
Sodium: 133 mmol/L — ABNORMAL LOW (ref 134–144)
Total Protein: 6.2 g/dL (ref 6.0–8.5)
eGFR: 61 mL/min/{1.73_m2} (ref >59)

## 2024-01-18 NOTE — Telephone Encounter (Signed)
 Copied from CRM [id number]. Topic: Clinical - Lab/Test Results >> Jan 18, 2024  1:49 PM Carlatta H wrote: Reason for CRM: Advised per chart: Your labwork results all are within normal limits.  Except elevated sugar of 207, elevated A1c of 7.6 Advised to increase glipizide  to 10 mg daily.  If you agree, let me know when you will be out of medication Advised low-carb diet.  Please check American diabetic Association website for more information on low-carb diet   Except iron deficiency anemia.  Please continue taking iron supplements per Dr. Evans Him advice   Patient is fine with medication increase

## 2024-01-19 ENCOUNTER — Other Ambulatory Visit: Payer: Self-pay | Admitting: Family Medicine

## 2024-01-19 DIAGNOSIS — E119 Type 2 diabetes mellitus without complications: Secondary | ICD-10-CM

## 2024-01-19 LAB — MICROALBUMIN / CREATININE URINE RATIO
Creatinine, Urine: 21.3 mg/dL
Microalb/Creat Ratio: <14 mg/g{creat} (ref 0–29)
Microalbumin, Urine: <3 ug/mL

## 2024-01-19 LAB — URINALYSIS, MICROSCOPIC ONLY
Casts: NONE SEEN /LPF
Epithelial Cells (non renal): NONE SEEN /HPF (ref 0–10)
RBC, Urine: NONE SEEN /HPF (ref 0–2)

## 2024-01-22 LAB — URINE CULTURE

## 2024-01-22 MED ORDER — SULFAMETHOXAZOLE-TRIMETHOPRIM 800-160 MG PO TABS: 1.0000 | ORAL_TABLET | Freq: Two times a day (BID) | ORAL | 0 refills | Status: DC

## 2024-01-23 ENCOUNTER — Other Ambulatory Visit: Payer: Self-pay | Admitting: Family Medicine

## 2024-01-23 DIAGNOSIS — Z1231 Encounter for screening mammogram for malignant neoplasm of breast: Secondary | ICD-10-CM

## 2024-01-24 ENCOUNTER — Other Ambulatory Visit: Payer: Self-pay | Admitting: Family Medicine

## 2024-01-24 DIAGNOSIS — R0981 Nasal congestion: Secondary | ICD-10-CM

## 2024-01-28 ENCOUNTER — Ambulatory Visit: Payer: Self-pay

## 2024-01-28 ENCOUNTER — Other Ambulatory Visit: Payer: Self-pay | Admitting: Family Medicine

## 2024-01-28 DIAGNOSIS — N390 Urinary tract infection, site not specified: Secondary | ICD-10-CM

## 2024-01-28 MED ORDER — DOXYCYCLINE HYCLATE 100 MG PO TABS: 100.0000 mg | ORAL_TABLET | Freq: Two times a day (BID) | ORAL | 0 refills | Status: AC

## 2024-01-28 NOTE — Telephone Encounter (Signed)
Changed to doxycycline

## 2024-01-28 NOTE — Telephone Encounter (Signed)
 FYI Only or Action Required?: Action required by provider: medication refill request.  Patient was last seen in primary care on 10/10/2023 by Hannah Nancyann BRAVO, MD. Called Nurse Triage reporting Nausea. Symptoms began Saturday. Interventions attempted: Prescription medications: UTI abx. Symptoms are: gradually worsening.  Triage Disposition: Call PCP Within 24 Hours  Patient/caregiver understands and will follow disposition?: No, wishes to speak with PCP     Reason for Triage: Patient states medication prescribed for UTI caused her to vomit over the weekend, no other symptoms, Seeking another medication.   Reason for Disposition  Taking prescription medication that could cause nausea (e.g., narcotics/opiates, antibiotics, OCPs, many others)    Pt states UTI medication caused her to have extreme n/v therefore she stopped taking the medication & would like new Rx for a different medication  Answer Assessment - Initial Assessment Questions 1. NAUSEA SEVERITY: How bad is the nausea? (e.g., mild, moderate, severe; dehydration, weight loss)   - MILD: loss of appetite without change in eating habits   - MODERATE: decreased oral intake without significant weight loss, dehydration, or malnutrition   - SEVERE: inadequate caloric or fluid intake, significant weight loss, symptoms of dehydration     severe 2. ONSET: When did the nausea begin?     Saturday 3. VOMITING: Any vomiting? If Yes, ask: How many times today?     Yes  4. RECURRENT SYMPTOM: Have you had nausea before? If Yes, ask: When was the last time? What happened that time?     no 5. CAUSE: What do you think is causing the nausea?     UTI abx 6. PREGNANCY: Is there any chance you are pregnant? (e.g., unprotected intercourse, missed birth control pill, broken condom)     N/a   On medication for x 2 days - causing extreme n/v: pt stopped taking medication due to n/v and would like different to assist with UTI s/s.  Pt  stated she was on a different UTI medication prior to this medication and did not have any n/v while taking.  Protocols used: Nausea-A-AH

## 2024-01-29 NOTE — Telephone Encounter (Signed)
 Patient advised.

## 2024-02-04 ENCOUNTER — Ambulatory Visit: Payer: Self-pay | Admitting: *Deleted

## 2024-02-04 NOTE — Telephone Encounter (Signed)
 Copied from CRM [id number]. Topic: Clinical - Red Word Triage >> Feb 04, 2024  9:04 AM Carlatta H wrote: [facility] that prompted transfer to Nurse Triage: Patient has been having back and side pain for about 2 weeks// nauseous for 2 weeks as well Reason for Disposition  Fever > 104 F (40 C)    UTI and nausea  Answer Assessment - Initial Assessment Questions 1. NAUSEA SEVERITY: How bad is the nausea? (e.g., mild, moderate, severe; dehydration, weight loss)   - MILD: loss of appetite without change in eating habits   - MODERATE: decreased oral intake without significant weight loss, dehydration, or malnutrition   - SEVERE: inadequate caloric or fluid intake, significant weight loss, symptoms of dehydration     I'm having nausea and back and side pain.  I'm hurting and nausea.   I had a UTI 2 weeks ago and been on antibiotics.  They have not helped. I'm having side pain on right side.   Last week I had lower back pain.   Last night and today it's in my side.   2. ONSET: When did the nausea begin?     2 weeks now 3. VOMITING: Any vomiting? If Yes, ask: How many times today?     No vomiting  4. RECURRENT SYMPTOM: Have you had nausea before? If Yes, ask: When was the last time? What happened that time?     The medication last week made me nauseas.   They changed me to a different antibiotic.   I'm having nausea still. 5. CAUSE: What do you think is causing the nausea?     I'm not sure.   I have a UTI 6. PREGNANCY: Is there any chance you are pregnant? (e.g., unprotected intercourse, missed birth control pill, broken condom)     N/A  Protocols used: Nausea-A-AH FYI Only or Action Required?: Action required by provider: request for appointment.  Patient was last seen in primary care on 10/10/2023 by Gasper Nancyann BRAVO, MD. Called Nurse Triage reporting Nausea. Symptoms began several weeks ago. Interventions attempted: Prescription medications: Doxycycline  for UTI.Since having frequency  and burning with a lot of nausea.  Symptoms are: unchanged.  Triage Disposition: See Within 3 Days in Office  Patient/caregiver understands and will follow disposition?: Yes Is it possible pt can be seen sooner than Wed. 7/9?  Thanks.   I also put her on the wait list in case of a cancellation.   She does not do MyChart so prefers to be called if there is a cancellation.

## 2024-02-06 ENCOUNTER — Ambulatory Visit (INDEPENDENT_AMBULATORY_CARE_PROVIDER_SITE_OTHER): Admitting: Family Medicine

## 2024-02-06 ENCOUNTER — Encounter: Payer: Self-pay | Admitting: Family Medicine

## 2024-02-06 VITALS — BP 138/83 | HR 76 | Ht 60.0 in | Wt 142.0 lb

## 2024-02-06 DIAGNOSIS — R300 Dysuria: Secondary | ICD-10-CM | POA: Diagnosis not present

## 2024-02-06 DIAGNOSIS — R110 Nausea: Secondary | ICD-10-CM | POA: Diagnosis not present

## 2024-02-06 DIAGNOSIS — N3090 Cystitis, unspecified without hematuria: Secondary | ICD-10-CM

## 2024-02-06 DIAGNOSIS — N179 Acute kidney failure, unspecified: Secondary | ICD-10-CM | POA: Diagnosis not present

## 2024-02-06 LAB — POCT URINALYSIS DIPSTICK
Bilirubin, UA: NEGATIVE
Blood, UA: NEGATIVE
Glucose, UA: NEGATIVE
Ketones, UA: NEGATIVE
Leukocytes, UA: NEGATIVE
Nitrite, UA: NEGATIVE
Protein, UA: NEGATIVE
Spec Grav, UA: 1.01 (ref 1.010–1.025)
Urobilinogen, UA: 2 U/dL — AB
pH, UA: 6.5 (ref 5.0–8.0)

## 2024-02-06 MED ORDER — ONDANSETRON 4 MG PO TBDP: 4.0000 mg | ORAL_TABLET | Freq: Three times a day (TID) | ORAL | 0 refills | Status: DC | PRN

## 2024-02-06 MED ORDER — CIPROFLOXACIN HCL 500 MG PO TABS: 500.0000 mg | ORAL_TABLET | Freq: Two times a day (BID) | ORAL | 0 refills | Status: AC

## 2024-02-06 NOTE — Progress Notes (Signed)
 ACUTE VISIT   Patient: Hannah [name]   DOB: 1951-01-20   73 y.o. Female  MRN: [id number]   PCP: Gasper Nancyann BRAVO, MD  Chief Complaint  Patient presents with   Urinary Tract Infection    She has been seen and treated but still having some burning feeling when she urinate, she dont understand why she is so nauseated this has been going on for 2 weeks     Subjective    HPI HPI     Urinary Tract Infection    Additional comments: She has been seen and treated but still having some burning feeling when she urinate, she dont understand why she is so nauseated this has been going on for 2 weeks        Last edited by Thelbert Eulalio HERO, CMA on 02/06/2024  9:43 AM.       Discussed the use of AI scribe software for clinical note transcription with the patient, who gave verbal consent to proceed.  History of Present Illness Hannah [name] is a 73 year old female who presents for acute hospital follow-up after treatment for a urinary tract infection.  She was seen in the [facility] emergency room on January 07, 2024, for lightheadedness. A metabolic panel revealed an elevated creatinine level of 1.54 and a low calcium level of 8.8. Her urine was cloudy with glucose, protein, large leukocytes, rare bacteria, and red blood cells. She was treated with Cefalexin 500 mg.  A urine culture from January 18, 2024, showed E. coli sensitive to cefepime, ceftriaxone, and ciprofloxacin . She has ongoing dysuria and was unable to provide a urine sample during the current visit. She is allergic to Bactrim , which causes nausea and vomiting, and cannot take penicillins or azithromycin.  Since her ED visit, she has been experiencing constant nausea and back pain, which she describes as intermittent and severe. She also had side pain over the weekend. She is trying to stay hydrated by drinking lots of water, Pedialyte, and Gatorade.  She has frequent diarrhea and is concerned about the possibility of kidney  stones. She is currently not taking any medication for nausea but mentions having pills that dissolve, starting with an 'N'. She is not familiar with Zofran .  No abdominal distention, suprapubic or lower quadrant tenderness, or CVA tenderness bilaterally. No pain during the physical exam.     Medications: Outpatient Medications Prior to Visit  Medication Sig   carvedilol  (COREG ) 12.5 MG tablet TAKE 1 TABLET BY MOUTH TWICE A DAY   cetirizine  (ZYRTEC ) 10 MG tablet Take 1 tablet (10 mg total) by mouth daily.   FLUoxetine  (PROZAC ) 40 MG capsule TAKE 1 CAPSULE BY MOUTH EVERY DAY   fluticasone  (FLONASE ) 50 MCG/ACT nasal spray SPRAY 2 SPRAYS INTO EACH NOSTRIL EVERY DAY   gabapentin  (NEURONTIN ) 600 MG tablet Take 1 tablet (600 mg total) by mouth at bedtime.   glipiZIDE  (GLUCOTROL  XL) 5 MG 24 hr tablet TAKE 1 TABLET BY MOUTH EVERY DAY WITH BREAKFAST   Iron-Vitamin C 65-125 MG TABS Take 1 tablet by mouth once a week.   metFORMIN  (GLUCOPHAGE ) 1000 MG tablet TAKE 1 TABLET BY MOUTH TWICE A DAY FOR DIABETES   montelukast  (SINGULAIR ) 10 MG tablet TAKE 1 TABLET BY MOUTH EVERYDAY AT BEDTIME   omeprazole  (PRILOSEC) 20 MG capsule TAKE 1 CAPSULE BY MOUTH EVERY DAY   pioglitazone  (ACTOS ) 15 MG tablet TAKE 1 TABLET (15 MG TOTAL) BY MOUTH DAILY.   simvastatin  (ZOCOR ) 40 MG  tablet TAKE 1 TABLET BY MOUTH EVERYDAY AT BEDTIME   valsartan -hydrochlorothiazide  (DIOVAN -HCT) 80-12.5 MG tablet TAKE 1 TABLET BY MOUTH EVERY DAY   No facility-administered medications prior to visit.    Last CBC Lab Results  Component Value Date   WBC 6.0 01/17/2024   HGB 9.7 (L) 01/17/2024   HCT 31.9 (L) 01/17/2024   MCV 89 01/17/2024   MCH 27.1 01/17/2024   RDW 14.1 01/17/2024   PLT 431 01/17/2024   Last metabolic panel Lab Results  Component Value Date   GLUCOSE 207 (H) 01/17/2024   NA 133 (L) 01/17/2024   K 4.8 01/17/2024   CL 96 01/17/2024   CO2 19 (L) 01/17/2024   BUN 21 01/17/2024   CREATININE 0.98 01/17/2024    EGFR 61 01/17/2024   CALCIUM 9.5 01/17/2024   PHOS 4.0 12/05/2021   PROT 6.2 01/17/2024   ALBUMIN 4.1 01/17/2024   LABGLOB 2.1 01/17/2024   AGRATIO 1.7 12/29/2022   BILITOT 0.4 01/17/2024   ALKPHOS 65 01/17/2024   AST 13 01/17/2024   ALT 13 01/17/2024   ANIONGAP 11 01/07/2024        Objective    BP 138/83   Pulse 76   Ht 5' (1.524 m)   Wt 142 lb (64.4 kg)   LMP 08/30/2003   SpO2 94%   BMI 27.73 kg/m    Physical Exam   Physical Exam ABDOMEN: No abdominal distention. Normal bowel sounds. No suprapubic or lower quadrant tenderness. Negative for CVA tenderness bilaterally.   No results found for any visits on 02/06/24.  Assessment & Plan     Assessment & Plan Urinary Tract Infection (UTI) Recurrent UTI with E. coli identified in previous urine culture. Initial treatment with Cefalexin was ineffective. Reports ongoing dysuria and nausea. Previous culture showed sensitivity to ciprofloxacin , cefepime, and ceftriaxone. Bactrim , penicillins, and azithromycin are contraindicated due to allergies. Ciprofloxacin  chosen based on culture sensitivity and allergy profile. Discussed potential side effects including GI upset and tendon pain, and the need for alternative treatment if these occur. - Prescribe ciprofloxacin  500 mg twice a day for 3 days - Monitor for side effects such as diarrhea and tendon pain - Repeat urine study to check for blood and confirm resolution of infection  Acute Kidney Injury (AKI) AKI identified during previous hospital visit with elevated creatinine at 1.54. Reports back pain, which may be related to kidney issues. No CVA tenderness on exam. Hydration status appears adequate with increased fluid intake. Emphasized importance of hydration to improve kidney function. - Order basic metabolic panel (BMP) to recheck creatinine levels - Encourage continued hydration and electrolyte replacement  Nausea Persistent nausea since previous hospital visit. Reports  constant nausea and occasional vomiting. Zofran  not previously used. Plan to trial Zofran  for symptom relief. - Prescribe Zofran  (ondansetron ) disintegrating tablets for nausea management      No follow-ups on file.        Rockie Agent, MD  [facility] [phone] (phone) [phone] (fax)  [facility]

## 2024-02-07 ENCOUNTER — Telehealth: Payer: Self-pay

## 2024-02-07 ENCOUNTER — Ambulatory Visit: Payer: Self-pay | Admitting: Family Medicine

## 2024-02-07 LAB — BMP8+EGFR
BUN/Creatinine Ratio: 17 (ref 12–28)
BUN: 14 mg/dL (ref 8–27)
CO2: 20 mmol/L (ref 20–29)
Calcium: 9.1 mg/dL (ref 8.7–10.3)
Chloride: 97 mmol/L (ref 96–106)
Creatinine, Ser: 0.83 mg/dL (ref 0.57–1.00)
Glucose: 149 mg/dL — ABNORMAL HIGH (ref 70–99)
Potassium: 5.2 mmol/L (ref 3.5–5.2)
Sodium: 137 mmol/L (ref 134–144)
eGFR: 74 mL/min/1.73 (ref >59)

## 2024-02-07 NOTE — Telephone Encounter (Signed)
 Copied from CRM [id number]. Topic: Clinical - Lab/Test Results >> Feb 07, 2024  9:48 AM Marylynn H wrote: Reason for CRM: Patient is calling in regarding her lab results from yesterday 07/09, please advise # [phone]

## 2024-02-07 NOTE — Telephone Encounter (Signed)
 Metabolic panel within normal limits, normal kidney function, normal calcium, sodium and potassium    Lab note in results note.

## 2024-02-09 DIAGNOSIS — E1169 Type 2 diabetes mellitus with other specified complication: Secondary | ICD-10-CM | POA: Diagnosis not present

## 2024-02-09 DIAGNOSIS — D509 Iron deficiency anemia, unspecified: Secondary | ICD-10-CM | POA: Diagnosis not present

## 2024-02-09 DIAGNOSIS — F3341 Major depressive disorder, recurrent, in partial remission: Secondary | ICD-10-CM | POA: Diagnosis not present

## 2024-02-09 DIAGNOSIS — E785 Hyperlipidemia, unspecified: Secondary | ICD-10-CM | POA: Diagnosis not present

## 2024-02-09 DIAGNOSIS — E663 Overweight: Secondary | ICD-10-CM | POA: Diagnosis not present

## 2024-02-09 DIAGNOSIS — K219 Gastro-esophageal reflux disease without esophagitis: Secondary | ICD-10-CM | POA: Diagnosis not present

## 2024-02-09 DIAGNOSIS — I10 Essential (primary) hypertension: Secondary | ICD-10-CM | POA: Diagnosis not present

## 2024-02-09 DIAGNOSIS — N390 Urinary tract infection, site not specified: Secondary | ICD-10-CM | POA: Diagnosis not present

## 2024-02-09 DIAGNOSIS — J309 Allergic rhinitis, unspecified: Secondary | ICD-10-CM | POA: Diagnosis not present

## 2024-02-09 DIAGNOSIS — E1142 Type 2 diabetes mellitus with diabetic polyneuropathy: Secondary | ICD-10-CM | POA: Diagnosis not present

## 2024-02-09 DIAGNOSIS — G629 Polyneuropathy, unspecified: Secondary | ICD-10-CM | POA: Diagnosis not present

## 2024-02-19 ENCOUNTER — Ambulatory Visit
Admission: RE | Admit: 2024-02-19 | Discharge: 2024-02-19 | Disposition: A | Discharge status: Home / Self Care | Source: Ambulatory Visit | Attending: Family Medicine | Admitting: Family Medicine

## 2024-02-19 DIAGNOSIS — Z1231 Encounter for screening mammogram for malignant neoplasm of breast: Secondary | ICD-10-CM | POA: Diagnosis not present

## 2024-02-19 DIAGNOSIS — Z853 Personal history of malignant neoplasm of breast: Secondary | ICD-10-CM | POA: Diagnosis not present

## 2024-02-21 ENCOUNTER — Ambulatory Visit (INDEPENDENT_AMBULATORY_CARE_PROVIDER_SITE_OTHER): Admitting: Family Medicine

## 2024-02-21 ENCOUNTER — Encounter: Payer: Self-pay | Admitting: Family Medicine

## 2024-02-21 VITALS — BP 155/72 | HR 74 | Temp 98.2°F | Ht 60.0 in | Wt 143.9 lb

## 2024-02-21 DIAGNOSIS — K219 Gastro-esophageal reflux disease without esophagitis: Secondary | ICD-10-CM

## 2024-02-21 DIAGNOSIS — R110 Nausea: Secondary | ICD-10-CM

## 2024-02-21 DIAGNOSIS — I10 Essential (primary) hypertension: Secondary | ICD-10-CM

## 2024-02-21 MED ORDER — ONDANSETRON 4 MG PO TBDP: 4.0000 mg | ORAL_TABLET | Freq: Three times a day (TID) | ORAL | 0 refills | Status: DC | PRN

## 2024-02-21 NOTE — Progress Notes (Signed)
 Acute Office Visit  Introduced to nurse practitioner role and practice setting.  All questions answered.  Discussed provider/patient relationship and expectations.   Subjective:     Patient ID: Hannah [name], female    DOB: 12-15-1950, 73 y.o.   MRN: [id number]  Chief Complaint  Patient presents with   Acute Visit    -Feeling nauseous for about a month with no other symptoms.  -Taking pills OTC for nausea, somewhat getting relieve not sure the name  Has not took her medications today.     Nausea for one month. HX of HTN, DMII, recent UTIs, HLD, and breast CA 2011.    Denies vomiting, melena, bloody stool, heartburn, constipation. No pain when she presses her stomach. She is still able to eat, but constantly feels sick to stomach. Denies changes to medication, diet, no traveling, no recent GI infections or exposures, no one else is sick with the same symptoms as she has. Is taking OTC anti nausea medication, Nauzene, from [facility]. She was seen by Dr. Lang on 02/06/24 for acute cystitis and nausea. She was prescribed and ABX and zofran . She did not pick up zofran  due to cost, insurance not covering it.   She does not check blood glucose, last A!C = 7.6 as of 01/17/24. Denies diaphoresis, polyphagia, polydipsia, or polyuria.     Review of Systems  Respiratory:  Negative for cough, shortness of breath and wheezing.   Cardiovascular:  Negative for chest pain and palpitations.  Gastrointestinal:  Positive for nausea. Negative for blood in stool, constipation, diarrhea, heartburn, melena and vomiting.  Genitourinary:  Negative for dysuria, flank pain, frequency, hematuria and urgency.  All other systems reviewed and are negative.       Objective:    BP (!) 155/72 (BP Location: Left Arm, Patient Position: Sitting, Cuff Size: Normal)   Pulse 74   Temp 98.2 F (36.8 C) (Oral)   Ht 5' (1.524 m)   Wt 143 lb 14.4 oz (65.3 kg)   LMP 08/30/2003   SpO2 98%   BMI 28.10 kg/m     Physical Exam Constitutional:      General: She is not in acute distress.    Appearance: Normal appearance. She is well-groomed and overweight. She is not toxic-appearing or diaphoretic.  HENT:     Head: Normocephalic.     Nose: Nose normal.     Mouth/Throat:     Mouth: Mucous membranes are moist.     Pharynx: Oropharynx is clear.  Eyes:     Extraocular Movements: Extraocular movements intact.     Pupils: Pupils are equal, round, and reactive to light.  Neck:     Vascular: No carotid bruit.  Cardiovascular:     Rate and Rhythm: Normal rate and regular rhythm.     Pulses: Normal pulses.     Heart sounds: Normal heart sounds. No murmur heard.    No friction rub. No gallop.  Pulmonary:     Effort: No respiratory distress.     Breath sounds: No stridor. No wheezing, rhonchi or rales.  Chest:     Chest wall: No tenderness.  Abdominal:     General: Abdomen is flat. Bowel sounds are normal. There is no distension.     Palpations: Abdomen is soft. There is no mass.     Tenderness: There is no abdominal tenderness. There is no guarding or rebound.     Hernia: No hernia is present.  Musculoskeletal:     Right lower leg:  No edema.     Left lower leg: No edema.  Lymphadenopathy:     Cervical: No cervical adenopathy.  Skin:    General: Skin is warm and dry.     Capillary Refill: Capillary refill takes less than 2 seconds.  Neurological:     General: No focal deficit present.     Mental Status: She is alert and oriented to person, place, and time. Mental status is at baseline.     Cranial Nerves: No cranial nerve deficit.     Sensory: No sensory deficit.     Motor: No weakness.     Gait: Gait normal.  Psychiatric:        Mood and Affect: Mood normal.        Behavior: Behavior normal.        Thought Content: Thought content normal.        Judgment: Judgment normal.     No results found for any visits on 02/21/24.      Assessment & Plan:   Acute on Chronic Nausea  -  Previously worked up on 02/06/24, pt had acute cystitis, was prescribed Cipro  and zofran . Pt finished antibiotic course, but never obtained zofran  due to cost. GI vs Cardiac Heart sounds normal, denies chest pain, palpitations, SOB, DOB. BP elevated, but did not take medications prior to visit. Concerning for GI issue, recommend US  - Refill zofran  to [facility] with GoodRx coupon - Will order Abdominal US  given length of symptoms - recommend at home blood glucose monitoring - may nee adjustment of medications - Will check CMP, (last normal), amylase, lipase, CRP, ESR, Tsh, and Liver Function Panel  - Recommend referral to GI based on results.   HTN - Chronic, elevated during visit, as she did not take medications today - Recommend continuing daily use of coreg  12.5 BID and Diovan  80/12.5 daily GOAL<130/80  GERD - continue daily use of omeprazole , nausea at this time appears different than pt's GERD symptoms  Problem List Items Addressed This Visit   None Visit Diagnoses       Nausea       Relevant Medications   ondansetron  (ZOFRAN -ODT) 4 MG disintegrating tablet   Other Relevant Orders   US  Abdomen Complete   Lipase   Amylase   Hepatic function panel   CBC with Differential/Platelet   Sed Rate (ESR)   C-reactive protein   TSH   Comprehensive Metabolic Panel (CMET)       Meds ordered this encounter  Medications   ondansetron  (ZOFRAN -ODT) 4 MG disintegrating tablet    Sig: Take 1 tablet (4 mg total) by mouth every 8 (eight) hours as needed for nausea or vomiting.    Dispense:  30 tablet    Refill:  0    Return if symptoms worsen or fail to improve.  Curtis DELENA Boom, FNP  I, Curtis DELENA Boom, FNP, have reviewed all documentation for this visit. The documentation on 02/21/24 for the exam, diagnosis, procedures, and orders are all accurate and complete.

## 2024-02-22 ENCOUNTER — Other Ambulatory Visit: Payer: Self-pay | Admitting: Family Medicine

## 2024-02-22 DIAGNOSIS — R110 Nausea: Secondary | ICD-10-CM | POA: Diagnosis not present

## 2024-02-23 LAB — CBC WITH DIFFERENTIAL/PLATELET
Basophils Absolute: 0 x10E3/uL (ref 0.0–0.2)
Basos: 1 %
EOS (ABSOLUTE): 0.2 x10E3/uL (ref 0.0–0.4)
Eos: 3 %
Hematocrit: 32.3 % — ABNORMAL LOW (ref 34.0–46.6)
Hemoglobin: 9.9 g/dL — ABNORMAL LOW (ref 11.1–15.9)
Immature Grans (Abs): 0 x10E3/uL (ref 0.0–0.1)
Immature Granulocytes: 0 %
Lymphocytes Absolute: 2.2 x10E3/uL (ref 0.7–3.1)
Lymphs: 33 %
MCH: 28.1 pg (ref 26.6–33.0)
MCHC: 30.7 g/dL — ABNORMAL LOW (ref 31.5–35.7)
MCV: 92 fL (ref 79–97)
Monocytes Absolute: 0.6 x10E3/uL (ref 0.1–0.9)
Monocytes: 10 %
Neutrophils Absolute: 3.5 x10E3/uL (ref 1.4–7.0)
Neutrophils: 53 %
Platelets: 445 x10E3/uL (ref 150–450)
RBC: 3.52 x10E6/uL — ABNORMAL LOW (ref 3.77–5.28)
RDW: 14.4 % (ref 11.7–15.4)
WBC: 6.5 x10E3/uL (ref 3.4–10.8)

## 2024-02-23 LAB — COMPREHENSIVE METABOLIC PANEL WITH GFR
ALT: 10 IU/L (ref 0–32)
AST: 11 IU/L (ref 0–40)
Albumin: 3.9 g/dL (ref 3.8–4.8)
Alkaline Phosphatase: 48 IU/L (ref 44–121)
BUN/Creatinine Ratio: 21 (ref 12–28)
BUN: 18 mg/dL (ref 8–27)
Bilirubin Total: 0.4 mg/dL (ref 0.0–1.2)
CO2: 23 mmol/L (ref 20–29)
Calcium: 9.1 mg/dL (ref 8.7–10.3)
Chloride: 100 mmol/L (ref 96–106)
Creatinine, Ser: 0.87 mg/dL (ref 0.57–1.00)
Globulin, Total: 2.2 g/dL (ref 1.5–4.5)
Glucose: 147 mg/dL — ABNORMAL HIGH (ref 70–99)
Potassium: 4.6 mmol/L (ref 3.5–5.2)
Sodium: 137 mmol/L (ref 134–144)
Total Protein: 6.1 g/dL (ref 6.0–8.5)
eGFR: 70 mL/min/1.73 (ref >59)

## 2024-02-23 LAB — C-REACTIVE PROTEIN: CRP: <1 mg/L (ref 0–10)

## 2024-02-23 LAB — SEDIMENTATION RATE: Sed Rate: 2 mm/h (ref 0–40)

## 2024-02-23 LAB — AMYLASE: Amylase: 55 U/L (ref 31–110)

## 2024-02-23 LAB — TSH: TSH: 1.37 u[IU]/mL (ref 0.450–4.500)

## 2024-02-23 LAB — HEPATIC FUNCTION PANEL: Bilirubin, Direct: 0.17 mg/dL (ref 0.00–0.40)

## 2024-02-23 LAB — LIPASE: Lipase: 50 U/L (ref 14–85)

## 2024-02-25 ENCOUNTER — Ambulatory Visit: Payer: Self-pay | Admitting: Family Medicine

## 2024-02-26 NOTE — Telephone Encounter (Signed)
 Copied from CRM [id number]. Topic: Clinical - Lab/Test Results >> Feb 26, 2024 11:24 AM Emylou G wrote: Adv patient of labs

## 2024-03-04 ENCOUNTER — Ambulatory Visit
Admission: RE | Admit: 2024-03-04 | Discharge: 2024-03-04 | Disposition: A | Discharge status: Home / Self Care | Source: Ambulatory Visit | Attending: Family Medicine | Admitting: Family Medicine

## 2024-03-04 DIAGNOSIS — R110 Nausea: Secondary | ICD-10-CM | POA: Diagnosis not present

## 2024-03-04 DIAGNOSIS — R109 Unspecified abdominal pain: Secondary | ICD-10-CM | POA: Diagnosis not present

## 2024-03-05 ENCOUNTER — Telehealth: Payer: Self-pay | Admitting: Family Medicine

## 2024-03-05 NOTE — Telephone Encounter (Signed)
 Mammogram states, continue annual screening. I do not see a recent R breast ultrasound. She had a recent abdominal ultrasound, but it has not been read.

## 2024-03-05 NOTE — Telephone Encounter (Signed)
 Copied from CRM #[id number]. Topic: General - Other >> Mar 05, 2024  1:43 PM Zebedee SAUNDERS wrote: Reason for CRM: Pt calling for imaging results for US  BREAST LTD UNI [employer] AXILLA (Order [id number]). Please call pt at [phone].

## 2024-03-06 ENCOUNTER — Telehealth: Payer: Self-pay

## 2024-03-06 NOTE — Telephone Encounter (Signed)
 Patient had U/S this week. Report is currently not available. Patient states she can not get in her mychart account.  Would like to be called with results when they are ready.   Copied from CRM #[id number]. Topic: Clinical - Lab/Test Results >> Mar 06, 2024 10:17 AM Cleave MATSU wrote: Reason for CRM: pt needs ultrasound results

## 2024-03-06 NOTE — Telephone Encounter (Signed)
 Called patient and gave her provider message.

## 2024-03-06 NOTE — Telephone Encounter (Signed)
 See other CRM-Duplicate

## 2024-03-14 NOTE — Telephone Encounter (Signed)
 Called pt to inform we will call her when we have those results. Due to mail box being full unable to leave message.

## 2024-03-14 NOTE — Telephone Encounter (Unsigned)
 Copied from CRM #[id number]. Topic: Clinical - Request for Lab/Test Order >> Mar 14, 2024  2:17 PM Gustabo D wrote: Patient would like a call back when her results are in

## 2024-03-16 ENCOUNTER — Other Ambulatory Visit: Payer: Self-pay | Admitting: Family Medicine

## 2024-03-16 DIAGNOSIS — G2581 Restless legs syndrome: Secondary | ICD-10-CM

## 2024-03-17 ENCOUNTER — Ambulatory Visit: Payer: Self-pay | Admitting: Family Medicine

## 2024-03-17 NOTE — Telephone Encounter (Signed)
 Patient called into the office to receive her results from her scan on her abdomen.  Relayed the results as there were no findings, stated well wonder what is making me so nauseated.  Patient verbalized understanding.

## 2024-03-25 ENCOUNTER — Other Ambulatory Visit (HOSPITAL_COMMUNITY): Payer: Self-pay

## 2024-03-25 ENCOUNTER — Telehealth: Payer: Self-pay | Admitting: Pharmacy Technician

## 2024-03-25 NOTE — Telephone Encounter (Signed)
 Pharmacy Patient Advocate Encounter   Received notification from Onbase that prior authorization for Ondansetron  4MG  dispersible tablets is required/requested.   Insurance verification completed.   The patient is insured through [facility] ADVANTAGE/RX ADVANCE .   Per test claim: PA required; PA submitted to above mentioned insurance via Latent Key/confirmation #/EOC Automatic Data Status is pending

## 2024-03-26 NOTE — Telephone Encounter (Signed)
 Copied from CRM [id number]. Topic: Clinical - Medical Advice >> Mar 26, 2024  9:19 AM Gustabo D wrote: Wants a call from Dr. Curtis regarding some test she recommended. And she would like to discuss some medication she gave her.

## 2024-03-26 NOTE — Progress Notes (Signed)
 Attempted phone call, no answer. Pt's Abdomen ultrasound was unremarkable, no acute findings. If she would like a referral to GI I can place that. Does she need a refill on zofran ? Otherwise I recommend following up with her PCP. Dr. Gasper.

## 2024-03-26 NOTE — Telephone Encounter (Signed)
 Pharmacy Patient Advocate Encounter  Received notification from [facility] ADVANTAGE/RX ADVANCE that Prior Authorization for Ondansetron  4MG  dispersible tablets  has been DENIED.  Full denial letter will be uploaded to the media tab. See denial reason below.   PA #/Case ID/Reference #: [id number]

## 2024-03-27 ENCOUNTER — Telehealth: Payer: Self-pay | Admitting: Family Medicine

## 2024-03-27 ENCOUNTER — Other Ambulatory Visit: Payer: Self-pay

## 2024-03-27 DIAGNOSIS — R110 Nausea: Secondary | ICD-10-CM

## 2024-03-27 MED ORDER — ONDANSETRON 4 MG PO TBDP
4.0000 mg | ORAL_TABLET | Freq: Three times a day (TID) | ORAL | 0 refills | Status: AC | PRN
Start: 2024-03-27 — End: ?

## 2024-03-27 NOTE — Telephone Encounter (Signed)
 Copied from CRM [id number]. Topic: General - Other >> Mar 26, 2024  4:40 PM Tiffany S wrote: Reason for CRM: Patient has questions about test that was  recommended she wasn't sure of the name and if her insurance will cover it please follow up with patient

## 2024-03-28 NOTE — Telephone Encounter (Signed)
 Spoke with patient and gave her provider's message. She just wasn't sure about what GI was. Referral placed.

## 2024-03-28 NOTE — Addendum Note (Signed)
 Addended by: Jaxden Blyden E on: 03/28/2024 09:29 AM   Modules accepted: Orders

## 2024-04-25 ENCOUNTER — Other Ambulatory Visit: Payer: Self-pay | Admitting: Family Medicine

## 2024-04-25 DIAGNOSIS — E119 Type 2 diabetes mellitus without complications: Secondary | ICD-10-CM

## 2024-05-20 ENCOUNTER — Telehealth: Payer: Self-pay | Admitting: Family Medicine

## 2024-05-20 NOTE — Telephone Encounter (Signed)
Please schedule appointment for diabetes follow up.

## 2024-05-20 NOTE — Telephone Encounter (Signed)
 Patient is requesting labs appt and an appt with Dr Gasper to f/u on her diabetes.  Thank you    Darice FORBES [place] [facility] AWV TEAM Direct Dial [phone]

## 2024-05-30 ENCOUNTER — Ambulatory Visit (INDEPENDENT_AMBULATORY_CARE_PROVIDER_SITE_OTHER): Admitting: Family Medicine

## 2024-05-30 ENCOUNTER — Encounter: Payer: Self-pay | Admitting: Family Medicine

## 2024-05-30 VITALS — BP 133/68 | HR 76 | Resp 16 | Wt 142.8 lb

## 2024-05-30 DIAGNOSIS — Z23 Encounter for immunization: Secondary | ICD-10-CM

## 2024-05-30 DIAGNOSIS — R200 Anesthesia of skin: Secondary | ICD-10-CM | POA: Diagnosis not present

## 2024-05-30 DIAGNOSIS — R202 Paresthesia of skin: Secondary | ICD-10-CM | POA: Diagnosis not present

## 2024-05-30 DIAGNOSIS — D508 Other iron deficiency anemias: Secondary | ICD-10-CM

## 2024-05-30 DIAGNOSIS — Z7984 Long term (current) use of oral hypoglycemic drugs: Secondary | ICD-10-CM | POA: Diagnosis not present

## 2024-05-30 DIAGNOSIS — E119 Type 2 diabetes mellitus without complications: Secondary | ICD-10-CM | POA: Diagnosis not present

## 2024-05-30 DIAGNOSIS — Z1211 Encounter for screening for malignant neoplasm of colon: Secondary | ICD-10-CM

## 2024-05-30 LAB — POCT GLYCOSYLATED HEMOGLOBIN (HGB A1C): Hemoglobin A1C: 7.5 % — AB (ref 4.0–5.6)

## 2024-05-30 NOTE — Patient Instructions (Addendum)
 Please review the attached list of medications and notify my office if there are any errors.   Please bring all of your medications to every appointment so we can make sure that our medication list is the same as yours.   Start taking over the counter vitamin B12 1,000mg  once every day

## 2024-05-30 NOTE — Progress Notes (Signed)
 Established patient visit   Patient: Hannah [name]   DOB: 06-11-1951   73 y.o. Female  MRN: [id number] Visit Date: 05/30/2024  Today's healthcare provider: Nancyann Perry, MD   Chief Complaint  Patient presents with   Medical Management of Chronic Issues    T2DM   Subjective    Discussed the use of AI scribe software for clinical note transcription with the patient, who gave verbal consent to proceed.  History of Present Illness   Hannah [name] is a 73 year old female with diabetes who presents for a follow-up visit to check her blood sugar levels.  Her HbA1c is 7.5. She is currently taking glipizide  5 mg once daily, metformin  twice daily, and pioglitazone . She experiences frequent urination, which she attributes to her medications.  She has a history of low blood cell counts and is taking an iron supplement every other day.  She underwent a colonoscopy in 2022, which showed no polyps, and she is due for a Cologuard test. She also had an upper endoscopy, which was normal. She continues to take Prilosec.  No swelling in her hands, feet, or ankles. She has vitamin B12 at home but is unsure of the dosage.     Lab Results  Component Value Date   HGBA1C 7.5 (A) 05/30/2024   HGBA1C 7.6 (H) 01/17/2024   HGBA1C 7.5 (A) 10/10/2023   Lab Results  Component Value Date   NA 137 02/22/2024   K 4.6 02/22/2024   CREATININE 0.87 02/22/2024   EGFR 70 02/22/2024   GLUCOSE 147 (H) 02/22/2024   Lab Results  Component Value Date   CHOL 180 10/10/2023   HDL 85 10/10/2023   LDLCALC 81 10/10/2023   LDLDIRECT 74.0 02/07/2016   TRIG 78 10/10/2023   CHOLHDL 2.1 10/10/2023     Medications: Outpatient Medications Prior to Visit  Medication Sig   carvedilol  (COREG ) 12.5 MG tablet TAKE 1 TABLET BY MOUTH TWICE A DAY   ferrous sulfate 325 (65 FE) MG EC tablet Take 325 mg by mouth every other day.   FLUoxetine  (PROZAC ) 40 MG capsule TAKE 1 CAPSULE BY MOUTH EVERY DAY   fluticasone   (FLONASE ) 50 MCG/ACT nasal spray SPRAY 2 SPRAYS INTO EACH NOSTRIL EVERY DAY   gabapentin  (NEURONTIN ) 600 MG tablet TAKE 1 TABLET BY MOUTH AT BEDTIME.   glipiZIDE  (GLUCOTROL  XL) 5 MG 24 hr tablet TAKE 1 TABLET BY MOUTH EVERY DAY WITH BREAKFAST   metFORMIN  (GLUCOPHAGE ) 1000 MG tablet TAKE 1 TABLET BY MOUTH TWICE A DAY FOR DIABETES   montelukast  (SINGULAIR ) 10 MG tablet TAKE 1 TABLET BY MOUTH EVERYDAY AT BEDTIME   omeprazole  (PRILOSEC) 20 MG capsule TAKE 1 CAPSULE BY MOUTH EVERY DAY   ondansetron  (ZOFRAN -ODT) 4 MG disintegrating tablet Take 1 tablet (4 mg total) by mouth every 8 (eight) hours as needed for nausea or vomiting.   pioglitazone  (ACTOS ) 15 MG tablet TAKE 1 TABLET (15 MG TOTAL) BY MOUTH DAILY.   simvastatin  (ZOCOR ) 40 MG tablet TAKE 1 TABLET BY MOUTH EVERYDAY AT BEDTIME   valsartan -hydrochlorothiazide  (DIOVAN -HCT) 80-12.5 MG tablet TAKE 1 TABLET BY MOUTH EVERY DAY   No facility-administered medications prior to visit.   Review of Systems  Constitutional:  Negative for appetite change, chills, fatigue and fever.  Respiratory:  Negative for chest tightness and shortness of breath.   Cardiovascular:  Negative for chest pain and palpitations.  Gastrointestinal:  Negative for abdominal pain, nausea and vomiting.  Neurological:  Negative for dizziness  and weakness.       Objective    BP 133/68 (BP Location: Left Arm, Patient Position: Sitting, Cuff Size: Normal)   Pulse 76   Resp 16   Wt 142 lb 12.8 oz (64.8 kg)   LMP 08/30/2003   SpO2 99%   BMI 27.89 kg/m   Physical Exam   General appearance: Well developed, well nourished female, cooperative and in no acute distress Head: Normocephalic, without obvious abnormality, atraumatic Respiratory: Respirations even and unlabored, normal respiratory rate Extremities: All extremities are intact.  Skin: Skin color, texture, turgor normal. No rashes seen  Psych: Appropriate mood and affect. Neurologic: Mental status: Alert, oriented  to person, place, and time, thought content appropriate.   Results for orders placed or performed in visit on 05/30/24  POCT glycosylated hemoglobin (Hb A1C)  Result Value Ref Range   Hemoglobin A1C 7.5 (A) 4.0 - 5.6 %    Assessment & Plan      1. Type 2 diabetes mellitus without complication, without long-term current use of insulin (HCC) (Primary) Fairly well controlled. Continue current medications.    2. Other iron deficiency anemia Taking iron supplement QOD - CBC - Iron, TIBC and Ferritin Panel  3. Numbness and tingling in both hands She has been off of b12 vitamins for several months and advised to start back on them.   4. Influenza vaccine needed  - Flu vaccine HIGH DOSE PF(Fluzone Trivalent)  5. Colon cancer screening  - Cologuard  Future Appointments  Date Time Provider Department Center  09/26/2024  8:20 AM Gasper, Nancyann BRAVO, MD [facility] Michaela Nancyann Gasper, MD  [facility] [phone] (phone) [phone] (fax)  [facility]

## 2024-05-31 LAB — CBC
Hematocrit: 32.4 % — ABNORMAL LOW (ref 34.0–46.6)
Hemoglobin: 10.1 g/dL — ABNORMAL LOW (ref 11.1–15.9)
MCH: 28.6 pg (ref 26.6–33.0)
MCHC: 31.2 g/dL — ABNORMAL LOW (ref 31.5–35.7)
MCV: 92 fL (ref 79–97)
Platelets: 418 x10E3/uL (ref 150–450)
RBC: 3.53 x10E6/uL — ABNORMAL LOW (ref 3.77–5.28)
RDW: 13.5 % (ref 11.7–15.4)
WBC: 6.6 x10E3/uL (ref 3.4–10.8)

## 2024-05-31 LAB — IRON,TIBC AND FERRITIN PANEL
Ferritin: 15 ng/mL (ref 15–150)
Iron Saturation: 17 % (ref 15–55)
Iron: 77 ug/dL (ref 27–139)
Total Iron Binding Capacity: 466 ug/dL — ABNORMAL HIGH (ref 250–450)
UIBC: 389 ug/dL — ABNORMAL HIGH (ref 118–369)

## 2024-06-01 ENCOUNTER — Ambulatory Visit: Payer: Self-pay | Admitting: Family Medicine

## 2024-06-06 ENCOUNTER — Other Ambulatory Visit: Payer: Self-pay | Admitting: Family Medicine

## 2024-06-15 LAB — COLOGUARD: COLOGUARD: NEGATIVE

## 2024-06-18 ENCOUNTER — Other Ambulatory Visit: Payer: Self-pay | Admitting: Family Medicine

## 2024-07-01 ENCOUNTER — Other Ambulatory Visit (HOSPITAL_BASED_OUTPATIENT_CLINIC_OR_DEPARTMENT_OTHER): Payer: Self-pay

## 2024-07-26 ENCOUNTER — Other Ambulatory Visit: Payer: Self-pay | Admitting: Family Medicine

## 2024-08-21 ENCOUNTER — Ambulatory Visit: Payer: Self-pay

## 2024-08-21 DIAGNOSIS — K2960 Other gastritis without bleeding: Secondary | ICD-10-CM | POA: Diagnosis not present

## 2024-08-21 DIAGNOSIS — K449 Diaphragmatic hernia without obstruction or gangrene: Secondary | ICD-10-CM | POA: Diagnosis not present

## 2024-09-01 ENCOUNTER — Other Ambulatory Visit: Payer: Self-pay | Admitting: Family Medicine

## 2024-09-26 ENCOUNTER — Ambulatory Visit: Admitting: Family Medicine

## 2025-03-19 ENCOUNTER — Encounter: Admitting: Family
